# Patient Record
Sex: Male | Born: 1935
Health system: Southern US, Community
[De-identification: ages and names within clinical notes are randomized; demographics above are authoritative.]

## PROBLEM LIST (undated history)

## (undated) DIAGNOSIS — F0390 Unspecified dementia without behavioral disturbance: Secondary | ICD-10-CM

## (undated) DIAGNOSIS — I00 Rheumatic fever without heart involvement: Secondary | ICD-10-CM

## (undated) DIAGNOSIS — I10 Essential (primary) hypertension: Secondary | ICD-10-CM

## (undated) DIAGNOSIS — I639 Cerebral infarction, unspecified: Secondary | ICD-10-CM

## (undated) DIAGNOSIS — I6340 Cerebral infarction due to embolism of unspecified cerebral artery: Secondary | ICD-10-CM

## (undated) DIAGNOSIS — G9340 Encephalopathy, unspecified: Secondary | ICD-10-CM

## (undated) DIAGNOSIS — I2510 Atherosclerotic heart disease of native coronary artery without angina pectoris: Secondary | ICD-10-CM

## (undated) DIAGNOSIS — E119 Type 2 diabetes mellitus without complications: Secondary | ICD-10-CM

## (undated) DIAGNOSIS — E785 Hyperlipidemia, unspecified: Secondary | ICD-10-CM

## (undated) HISTORY — DX: Rheumatic fever without heart involvement: I00

## (undated) HISTORY — DX: Type 2 diabetes mellitus without complications: E11.9

## (undated) HISTORY — PX: CORONARY ANGIOPLASTY WITH STENT PLACEMENT: SHX49

## (undated) HISTORY — PX: APPENDECTOMY: SHX54

## (undated) HISTORY — DX: Hyperlipidemia, unspecified: E78.5

## (undated) HISTORY — DX: Essential (primary) hypertension: I10

## (undated) HISTORY — DX: Atherosclerotic heart disease of native coronary artery without angina pectoris: I25.10

---

## 2003-03-23 ENCOUNTER — Ambulatory Visit (HOSPITAL_COMMUNITY): Admission: RE | Admit: 2003-03-23 | Discharge: 2003-03-23 | Payer: Self-pay | Admitting: Pulmonary Disease

## 2003-03-27 ENCOUNTER — Ambulatory Visit (HOSPITAL_COMMUNITY): Admission: RE | Admit: 2003-03-27 | Discharge: 2003-03-27 | Payer: Self-pay | Admitting: Pulmonary Disease

## 2005-03-09 ENCOUNTER — Encounter: Payer: Self-pay | Admitting: Internal Medicine

## 2005-03-10 ENCOUNTER — Inpatient Hospital Stay (HOSPITAL_COMMUNITY): Admission: AD | Admit: 2005-03-10 | Discharge: 2005-03-12 | Payer: Self-pay | Admitting: Pediatrics

## 2005-03-10 ENCOUNTER — Ambulatory Visit: Payer: Self-pay | Admitting: *Deleted

## 2005-03-11 ENCOUNTER — Ambulatory Visit: Payer: Self-pay | Admitting: Cardiovascular Disease

## 2005-03-20 ENCOUNTER — Ambulatory Visit: Payer: Self-pay | Admitting: *Deleted

## 2005-03-24 ENCOUNTER — Ambulatory Visit (HOSPITAL_COMMUNITY): Admission: RE | Admit: 2005-03-24 | Discharge: 2005-03-24 | Payer: Self-pay | Admitting: *Deleted

## 2005-05-27 ENCOUNTER — Ambulatory Visit: Payer: Self-pay | Admitting: *Deleted

## 2005-06-11 ENCOUNTER — Ambulatory Visit: Payer: Self-pay | Admitting: Cardiology

## 2005-10-27 ENCOUNTER — Ambulatory Visit: Payer: Self-pay | Admitting: *Deleted

## 2005-11-23 ENCOUNTER — Ambulatory Visit: Payer: Self-pay | Admitting: *Deleted

## 2009-06-29 ENCOUNTER — Ambulatory Visit: Payer: Self-pay | Admitting: Cardiovascular Disease

## 2009-06-29 ENCOUNTER — Observation Stay (HOSPITAL_COMMUNITY): Admission: EM | Admit: 2009-06-29 | Discharge: 2009-07-01 | Payer: Self-pay | Admitting: Emergency Medicine

## 2009-06-29 ENCOUNTER — Encounter (INDEPENDENT_AMBULATORY_CARE_PROVIDER_SITE_OTHER): Payer: Self-pay | Admitting: *Deleted

## 2009-06-29 LAB — CONVERTED CEMR LAB
BUN: 5 mg/dL
Chloride: 103 meq/L
GFR calc non Af Amer: >60 mL/min
Potassium: 3.9 meq/L
Sodium: 136 meq/L

## 2009-06-30 ENCOUNTER — Encounter (INDEPENDENT_AMBULATORY_CARE_PROVIDER_SITE_OTHER): Payer: Self-pay | Admitting: *Deleted

## 2009-06-30 LAB — CONVERTED CEMR LAB
Alkaline Phosphatase: 40 units/L
BUN: 7 mg/dL
CO2: 26 meq/L
Chloride: 100 meq/L
Glomerular Filtration Rate, Af Am: >60 mL/min/{1.73_m2}
Potassium: 4 meq/L
Total Protein: 6.2 g/dL

## 2009-07-12 ENCOUNTER — Encounter (INDEPENDENT_AMBULATORY_CARE_PROVIDER_SITE_OTHER): Payer: Self-pay | Admitting: *Deleted

## 2009-07-12 DIAGNOSIS — I10 Essential (primary) hypertension: Secondary | ICD-10-CM | POA: Insufficient documentation

## 2009-07-12 DIAGNOSIS — I209 Angina pectoris, unspecified: Secondary | ICD-10-CM

## 2009-07-12 DIAGNOSIS — E785 Hyperlipidemia, unspecified: Secondary | ICD-10-CM

## 2009-07-12 DIAGNOSIS — E119 Type 2 diabetes mellitus without complications: Secondary | ICD-10-CM

## 2009-07-22 ENCOUNTER — Encounter: Payer: Self-pay | Admitting: Adult Health

## 2009-07-22 ENCOUNTER — Ambulatory Visit: Payer: Self-pay | Admitting: Cardiology

## 2009-07-22 DIAGNOSIS — I2510 Atherosclerotic heart disease of native coronary artery without angina pectoris: Secondary | ICD-10-CM

## 2009-07-23 ENCOUNTER — Encounter (INDEPENDENT_AMBULATORY_CARE_PROVIDER_SITE_OTHER): Payer: Self-pay

## 2009-08-05 ENCOUNTER — Ambulatory Visit (HOSPITAL_COMMUNITY): Admission: RE | Admit: 2009-08-05 | Discharge: 2009-08-05 | Payer: Self-pay | Admitting: Internal Medicine

## 2010-05-12 ENCOUNTER — Ambulatory Visit: Payer: Self-pay | Admitting: Cardiology

## 2010-05-12 DIAGNOSIS — K089 Disorder of teeth and supporting structures, unspecified: Secondary | ICD-10-CM | POA: Insufficient documentation

## 2010-05-13 ENCOUNTER — Encounter: Payer: Self-pay | Admitting: Adult Health

## 2010-05-28 ENCOUNTER — Telehealth (INDEPENDENT_AMBULATORY_CARE_PROVIDER_SITE_OTHER): Payer: Self-pay | Admitting: *Deleted

## 2010-07-15 NOTE — Miscellaneous (Signed)
Summary: Medications update  Clinical Lists Changes  Medications: Changed medication from ISOSORBIDE MONONITRATE CR 30 MG XR24H-TAB (ISOSORBIDE MONONITRATE) take 1/2 tab daily to ISOSORBIDE MONONITRATE CR 30 MG XR24H-TAB (ISOSORBIDE MONONITRATE) take 1 tab daily

## 2010-07-15 NOTE — Assessment & Plan Note (Signed)
Summary: post hosp [facility] per pt phone call/tg   Visit Type:  Follow-up Primary Provider:  Osborne Casco   History of Present Illness: Jacob Little is a 8 CM who was recenty discharged from [facility] after undergoing cardiac catherization in the setting of known CAD with DES to the Circumflex artery,and recurrent chest pain.  He also has a history of DM, hyperlipidemia, and hypertension.   The catherization revealed single vessel coronary disease with patent stent in the proximal circumflex. Nonobstructive disease elsewhere.  Normal LV fx.  EF 65%.  He was to be treated medically and was started on Imdur 30mg  daily.  He has continued discomfort in his chest which he describes as shooting, fleeting pain, across the left side.  Lasting seconds.  He sometimes has complaints of transient positional dizziness. He admits to excessive caffine daily-" can drink a pot a day or more."  His daughter, who is a Teacher, early years/pre, accomplainies him and is requesting a change from lopressor 25mg  two times a day to Toprol XL.  In the past he did not tolerate this secondary to mild syncope.  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 Take 1 Tab Daily 2)  Aspir-Low 81 Mg Tbec (Aspirin) .... Take 1 Tab Daily 3)  Metformin Hcl 1000 Mg Tabs (Metformin Hcl) .... Take 1 Tab Two Times A Day 4)  Simvastatin 20 Mg Tabs (Simvastatin) .... Take 1 Tab Daily  Current Medications (verified): 1)  Plavix 75 Mg Tabs (Clopidogrel Bisulfate) .... Take 1 Tablet By Mouth Once A Day 2)  Aspir-Trin 325 Mg Tbec (Aspirin) .... Take 1 Tab Daily 3)  Metformin  Hcl 1000 Mg Tabs (Metformin Hcl) .... Take 1 Tab Two Times A Day 4)  Simvastatin 20 Mg Tabs (Simvastatin) .... Take 1 Tablet By Mouth Once A Day 5)  Metoprolol Tartrate 50 Mg Tabs (Metoprolol Tartrate) .... Take 1/2 Tab Two Times A Day 6)  Isosorbide Mononitrate Cr 30 Mg Xr24h-Tab (Isosorbide Mononitrate) .... Take 1/2 Tab Daily 7)  Nitrostat 0.4 Mg Subl (Nitroglycerin) .... Take As Needed  Allergies (verified): No Known Drug Allergies  Past History:  Past Medical History: Last updated: 07/12/2009 Current Problems:  HYPERLIPIDEMIA (ICD-272.4) HYPERTENSION (ICD-401.9) DM (ICD-250.00) ANGINA, ATYPICAL (ICD-413.9)  Risk Factors: Alcohol Use: 0 (07/22/2009) Caffeine Use: 5+ (07/22/2009) Exercise: no (07/12/2009) PMH-FH-SH reviewed-no changes except otherwise noted  Social History: Alcohol drinks/day:  0 Smoking Status:  quit < 6 months Caffeine use/day:  5+  Review of Systems       Occasional dizziness with position changes. Fleeting sharp chest discomfort.  All other systems have been reviewed and are negative unless stated above.   Vital Signs:  Patient profile:   75 year old male Height:      69 inches Weight:      175 pounds BMI:     25.94 Pulse rate:   54 / minute BP sitting:   136 / 73  (right arm)  Vitals Entered By: Dreama Saa, CNA (July 22, 2009 11:07 AM)  Physical Exam  General:  Well developed, well nourished, in no  acute distress. Lungs:  Clear bilaterally to auscultation and percussion. Heart:  Non-displaced PMI, chest non-tender; regular rate and rhythm, S1, S2 without murmurs, rubs or gallops. Carotid upstroke normal, no bruit. Normal abdominal aortic size, no bruits. Femorals normal pulses, no bruits. Pedals normal pulses. No edema, no varicosities. Abdomen:  Obese 2+ bowel sounds Msk:  Back normal, normal gait. Muscle strength and tone normal. No femoral brulsing. Neurologic:  Very HOH Skin:  Intact without lesions or rashes. Psych:  Normal  affect.   EKG  Procedure date:  07/22/2009  Findings:      Nonspecific intraventricular block Sinus bradycardia with rate of:  52 bpm  Impression & Recommendations:  Problem # 1:  ANGINA, ATYPICAL (ICD-413.9) Assessment Unchanged  His updated medication list for this problem includes:    Plavix 75 Mg Tabs (Clopidogrel bisulfate) .Marland Kitchen... Take 1 tablet by mouth once a day    Aspir-trin 325 Mg Tbec (Aspirin) .Marland Kitchen... Take 1 tab daily    Metoprolol Tartrate 50 Mg Tabs (Metoprolol tartrate) .Marland Kitchen... Take 1/2 tab two times a day    Isosorbide Mononitrate Cr 30 Mg Xr24h-tab (Isosorbide mononitrate) .Marland Kitchen... Take 1/2 tab daily    Nitrostat 0.4 Mg Subl (Nitroglycerin) .Marland Kitchen... Take as needed  Problem # 2:  CORONARY ATHEROSCLEROSIS NATIVE CORONARY ARTERY (ICD-414.01) Assessment: Unchanged Review of cath report reveals non obstructive disease with patent stent to Cx Will continue Imdur as long has he tolerates. Some concern for postitional dizziness and bradycardia.  I will not change his Toprol to XL as long as he remains bradycardic.  Easier to titrate down if we need to.  Advised against excessive caffine and to watch this especially in the summer months to avoid dehydration.  His updated medication list for this problem includes:    Plavix 75 Mg Tabs (Clopidogrel bisulfate) .Marland Kitchen... Take 1 tablet by mouth once a day    Aspir-trin 325 Mg Tbec (Aspirin) .Marland Kitchen... Take 1 tab daily    Metoprolol Tartrate 50 Mg Tabs (Metoprolol tartrate) .Marland Kitchen... Take 1/2 tab two times a day    Isosorbide Mononitrate Cr 30 Mg Xr24h-tab (Isosorbide mononitrate) .Marland Kitchen... Take 1/2 tab daily    Nitrostat 0.4 Mg Subl (Nitroglycerin) .Marland Kitchen... Take as needed  Patient Instructions: 1)  Your physician recommends that you schedule a follow-up appointment in: 6 months Prescriptions: NITROSTAT 0.4 MG SUBL (NITROGLYCERIN) take as needed  #25 x 3   Entered by:   Teressa Lower RN   Authorized by:   Joni Reining, NP   Signed by:   Teressa Lower RN  on 07/22/2009   Method used:   Print then Give to Patient   RxID:   [id number] ISOSORBIDE MONONITRATE CR 30 MG XR24H-TAB (ISOSORBIDE MONONITRATE) take 1/2 tab daily  #15 x 6   Entered by:   Teressa Lower RN   Authorized by:   Joni Reining, NP   Signed by:   Teressa Lower RN on 07/22/2009   Method used:   Print then Give to Patient   RxID:   [id number] METOPROLOL TARTRATE 50 MG TABS (METOPROLOL TARTRATE) take 1/2 tab two times a day  #30 x 6   Entered by:   Teressa Lower RN   Authorized by:   Joni Reining, NP   Signed by:   Teressa Lower RN on 07/22/2009   Method used:   Print then Give to Patient   RxID:   [id number] SIMVASTATIN 20 MG TABS (SIMVASTATIN) Take 1 tablet by mouth once a day  #30 x 6  Entered by:   Teressa Lower RN   Authorized by:   Joni Reining, NP   Signed by:   Teressa Lower RN on 07/22/2009   Method used:   Print then Give to Patient   RxID:   [id number] METFORMIN HCL 1000 MG TABS (METFORMIN HCL) take 1 tab two times a day  #60 x 6   Entered by:   Teressa Lower RN   Authorized by:   Joni Reining, NP   Signed by:   Teressa Lower RN on 07/22/2009   Method used:   Print then Give to Patient   RxID:   [id number] PLAVIX 75 MG TABS (CLOPIDOGREL BISULFATE) Take 1 tablet by mouth once a day  #30 x 6   Entered by:   Teressa Lower RN   Authorized by:   Joni Reining, NP   Signed by:   Teressa Lower RN on 07/22/2009   Method used:   Print then Give to Patient   RxID:   [id number]

## 2010-07-15 NOTE — Assessment & Plan Note (Signed)
Summary: past due for 6 mth f/u/tg   Visit Type:  Follow-up Primary Provider:  Osborne Casco  CC:  occasional sob.  History of Present Illness: [name] is a 75 y/o CM we are following for continued treatment and assessment of CAD with  DES stent to Circumflex in 2011, with history of hypertension, diabetes and hyperlipidemia.  He is without complaints of chest pain, DOE or any cardiac symptoms.  He is tolerating his medications without complaint as well.  Preventive Screening-Counseling & Management  Alcohol-Tobacco     Alcohol drinks/day: 0     Smoking Status: quit  Current Medications (verified): 1)  Plavix 75 Mg Tabs (Clopidogrel Bisulfate) .... Take 1 Tablet By Mouth Once A Day 2)  Aspir-Trin 325 Mg Tbec (Aspirin) .... Take 1 Tab Daily 3)  Metformin Hcl 1000 Mg Tabs (Metformin Hcl) .... Take 1 Tab Two Times A Day 4)  Simvastatin 20 Mg Tabs (Simvastatin) .... Take 1 Tablet By Mouth Once A Day 5)  Metoprolol Tartrate 50 Mg Tabs (Metoprolol Tartrate) .... Take 1/2 Tab Two Times A Day 6)  Isosorbide Mononitrate Cr 30 Mg Xr24h-Tab (Isosorbide Mononitrate) .... Take 1 Tab Daily 7)  Nitrostat 0.4 Mg Subl (Nitroglycerin) .... Take As Needed  Allergies: No Known Drug Allergies  Comments:  Nurse/Medical Assistant: patient brought meds and patient and i reviewed meds the patient stated all meds are correct  Past History:  Past medical, surgical, family and social histories (including risk factors) reviewed, and no changes noted (except as noted below).  Past Medical History: Reviewed history from 07/12/2009 and no changes required. Current Problems:  HYPERLIPIDEMIA (ICD-272.4) HYPERTENSION (ICD-401.9) DM (ICD-250.00) ANGINA, ATYPICAL (ICD-413.9)  Past Surgical History: Reviewed history from 07/12/2009 and no changes required. cath 07/01/2009  Family History: Reviewed history and no changes required.  Social History: Reviewed history from 07/12/2009 and no changes  required. Tobacco Use - No.  Alcohol Use - no Regular Exercise - no Drug Use - no Smoking Status:  quit  Review of Systems       Tooth pain right lower jaw after filling came out.  All other systems have been reviewed and are negative unless stated above.   Vital Signs:  Patient profile:   75 year old male Weight:      168 pounds BMI:     24.90 O2 Sat:      98 % on Room air Pulse rate:   83 / minute BP sitting:   105 / 64  (right arm)  Vitals Entered By: Dreama Saa, CNA (May 12, 2010 1:11 PM)  O2 Flow:  Room air  Physical Exam  General:  normal appearance.   Head:  normocephalic and atraumatic Mouth:  poor dentition.   Lungs:  Clear bilaterally to auscultation and percussion. Heart:  Non-displaced PMI, chest non-tender; regular rate and rhythm, S1, S2 without murmurs, rubs or gallops. Carotid upstroke normal, no bruit. Normal abdominal aortic size, no bruits. Femorals normal pulses, no bruits. Pedals normal pulses. No edema, no varicosities. Abdomen:  Bowel sounds positive; abdomen soft and non-tender without masses, organomegaly, or hernias noted. No hepatosplenomegaly. Msk:  Back normal, normal gait. Muscle strength and tone normal. Pulses:  pulses normal in all 4 extremities Extremities:  No clubbing or cyanosis. Neurologic:  Alert and oriented x 3. Psych:  depressed affect.  Still grieving over death of wife. Tearful when he speaks of her.   EKG  Procedure date:  05/12/2010  Findings:      Normal  sinus rhythm with rate of:65 bpm  PVC's noted.  Right bundle branch block.    Impression & Recommendations:  Problem # 1:  CORONARY ATHEROSCLEROSIS NATIVE CORONARY ARTERY (ICD-414.01) He is without cardiac complaint at this time.  He is not compliant with low cholesterol, low sodium diet because he says he eats out a lot.  He has dropped 8 lbs since being seen last. He says he doesn't eat much since his wife died.  He remains active.  No changes at this time.  He  is up to date on medications. We will see him in one year unless he is symptomatic. His updated medication list for this problem includes:    Plavix 75 Mg Tabs (Clopidogrel bisulfate) .Marland Kitchen... Take 1 tablet by mouth once a day    Aspir-trin 325 Mg Tbec (Aspirin) .Marland Kitchen... Take 1 tab daily    Metoprolol Tartrate 50 Mg Tabs (Metoprolol tartrate) .Marland Kitchen... Take 1/2 tab two times a day    Isosorbide Mononitrate Cr 30 Mg Xr24h-tab (Isosorbide mononitrate) .Marland Kitchen... Take 1 tab daily    Nitrostat 0.4 Mg Subl (Nitroglycerin) .Marland Kitchen... Take as needed  Problem # 2:  HYPERTENSION (ICD-401.9) Recheck in the exam room reveals that his BP is 120's systolic without orthostatic readings.  Continue his medications as directed. His updated medication list for this problem includes:    Aspir-trin 325 Mg Tbec (Aspirin) .Marland Kitchen... Take 1 tab daily    Metoprolol Tartrate 50 Mg Tabs (Metoprolol tartrate) .Marland Kitchen... Take 1/2 tab two times a day  Problem # 3:  DENTAL PAIN (ICD-525.9) He has marked dental caries and some erythema in the right lower jaw, with several missing teeth.  We have referred him to [facility] dentist to be evaluated and treated.  Patient Instructions: 1)  Your physician recommends that you schedule a follow-up appointment in: 1 year

## 2010-07-15 NOTE — Miscellaneous (Signed)
Summary: HOSP LABS  Clinical Lists Changes  Observations: Added new observation of CALCIUM: 8.7 mg/dL (16/03/9603 54:09) Added new observation of ALBUMIN: 3.7 g/dL (81/19/1478 29:56) Added new observation of PROTEIN, TOT: 6.2 g/dL (21/30/8657 84:69) Added new observation of SGPT (ALT): 17 units/L (06/30/2009 16:05) Added new observation of SGOT (AST): 18 units/L (06/30/2009 16:05) Added new observation of ALK PHOS: 40 units/L (06/30/2009 16:05) Added new observation of GFR AA: >60 mL/min/1.58m2 (06/30/2009 16:05) Added new observation of GFR: >60 mL/min (06/30/2009 16:05) Added new observation of CREATININE: 0.76 mg/dL (62/95/2841 32:44) Added new observation of BUN: 7 mg/dL (06/17/7251 66:44) Added new observation of BG RANDOM: 145 mg/dL (03/47/4259 56:38) Added new observation of CO2 PLSM/SER: 26 meq/L (06/30/2009 16:05) Added new observation of CL SERUM: 100 meq/L (06/30/2009 16:05) Added new observation of K SERUM: 4.0 meq/L (06/30/2009 16:05) Added new observation of NA: 132 meq/L (06/30/2009 16:05) Added new observation of CALCIUM: 9.4 mg/dL (75/64/3329 51:88) Added new observation of GFR AA: >60 mL/min/1.41m2 (06/29/2009 16:05) Added new observation of GFR: >60 mL/min (06/29/2009 16:05) Added new observation of CREATININE: 0.82 mg/dL (41/66/0630 16:01) Added new observation of BUN: 5 mg/dL (09/32/3557 32:20) Added new observation of BG RANDOM: 166 mg/dL (25/42/7062 37:62) Added new observation of CO2 PLSM/SER: 26 meq/L (06/29/2009 16:05) Added new observation of CL SERUM: 103 meq/L (06/29/2009 16:05) Added new observation of K SERUM: 3.9 meq/L (06/29/2009 16:05) Added new observation of NA: 136 meq/L (06/29/2009 16:05)

## 2010-07-17 NOTE — Progress Notes (Signed)
Summary: rx was called in worng  Phone Note Call from Patient Call back at [phone]   Caller: pt daughter Maxine Glenn Reason for Call: Talk to Nurse Summary of Call: when pt came for last appt she sent a letter to call metoprolol in for 20mg  so he would not have to cut the pills in half. The patient can not handle cutting his pills in half. they have already filled the rx that was called in for him but would like another one called in for 20mg  is possible. Initial call taken by: Faythe Ghee,  May 28, 2010 4:11 PM    New/Updated Medications: METOPROLOL TARTRATE 25 MG TABS (METOPROLOL TARTRATE) Take one tablet by mouth twice a day Prescriptions: METOPROLOL TARTRATE 25 MG TABS (METOPROLOL TARTRATE) Take one tablet by mouth twice a day  #60 x 3   Entered by:   Teressa Lower RN   Authorized by:   Joni Reining, NP   Signed by:   Teressa Lower RN on 05/28/2010   Method used:   Electronically to        [facility] [phone]* (retail)       [address]       [place], [place]  [address]       Ph: [phone] or [phone]       Fax: [phone]   RxID:   [id number]

## 2010-08-31 LAB — CBC
HCT: 36 % — ABNORMAL LOW (ref 39.0–52.0)
Hemoglobin: 12.5 g/dL — ABNORMAL LOW (ref 13.0–17.0)
MCHC: 34.2 g/dL (ref 30.0–36.0)
MCV: 92.2 fL (ref 78.0–100.0)
Platelets: 227 10*3/uL (ref 150–400)
Platelets: 202 10*3/uL (ref 150–400)
RDW: 13.3 % (ref 11.5–15.5)
RDW: 13.2 % (ref 11.5–15.5)

## 2010-08-31 LAB — PROTIME-INR
INR: 0.97 (ref 0.00–1.49)
Prothrombin Time: 12.8 seconds (ref 11.6–15.2)

## 2010-08-31 LAB — CARDIAC PANEL(CRET KIN+CKTOT+MB+TROPI)
Relative Index: INVALID (ref 0.0–2.5)
Relative Index: INVALID (ref 0.0–2.5)
Total CK: 44 U/L (ref 7–232)
Troponin I: 0.01 ng/mL (ref 0.00–0.06)

## 2010-08-31 LAB — COMPREHENSIVE METABOLIC PANEL
AST: 18 U/L (ref 0–37)
BUN: 7 mg/dL (ref 6–23)
Chloride: 100 mEq/L (ref 96–112)
GFR calc non Af Amer: >60 mL/min (ref >60)
Total Bilirubin: 0.5 mg/dL (ref 0.3–1.2)

## 2010-08-31 LAB — DIFFERENTIAL
Basophils Absolute: 0 10*3/uL (ref 0.0–0.1)
Basophils Absolute: 0 10*3/uL (ref 0.0–0.1)
Basophils Relative: 0 % (ref 0–1)
Basophils Relative: 0 % (ref 0–1)
Eosinophils Absolute: 0.3 10*3/uL (ref 0.0–0.7)
Eosinophils Absolute: 0.2 10*3/uL (ref 0.0–0.7)
Lymphs Abs: 1.5 10*3/uL (ref 0.7–4.0)
Neutrophils Relative %: 65 % (ref 43–77)
Neutrophils Relative %: 70 % (ref 43–77)

## 2010-08-31 LAB — BASIC METABOLIC PANEL
BUN: 5 mg/dL — ABNORMAL LOW (ref 6–23)
Chloride: 103 mEq/L (ref 96–112)
Glucose, Bld: 166 mg/dL — ABNORMAL HIGH (ref 70–99)
Potassium: 3.9 mEq/L (ref 3.5–5.1)

## 2010-08-31 LAB — APTT: aPTT: 25 seconds (ref 24–37)

## 2010-08-31 LAB — GLUCOSE, CAPILLARY
Glucose-Capillary: 122 mg/dL — ABNORMAL HIGH (ref 70–99)
Glucose-Capillary: 132 mg/dL — ABNORMAL HIGH (ref 70–99)
Glucose-Capillary: 146 mg/dL — ABNORMAL HIGH (ref 70–99)
Glucose-Capillary: 171 mg/dL — ABNORMAL HIGH (ref 70–99)
Glucose-Capillary: 178 mg/dL — ABNORMAL HIGH (ref 70–99)

## 2010-08-31 LAB — TROPONIN I: Troponin I: 0.02 ng/mL (ref 0.00–0.06)

## 2010-08-31 LAB — CK TOTAL AND CKMB (NOT AT ARMC): CK, MB: 1.8 ng/mL (ref 0.3–4.0)

## 2010-08-31 LAB — POCT CARDIAC MARKERS: Troponin i, poc: <0.05 ng/mL (ref 0.00–0.09)

## 2010-08-31 LAB — MAGNESIUM: Magnesium: 1.7 mg/dL (ref 1.5–2.5)

## 2010-10-15 ENCOUNTER — Other Ambulatory Visit: Payer: Self-pay

## 2010-10-15 MED ORDER — METOPROLOL TARTRATE 25 MG PO TABS: 25.0000 mg | ORAL_TABLET | Freq: Two times a day (BID) | ORAL | Status: DC

## 2010-10-31 NOTE — Discharge Summary (Signed)
NAME:  Jacob Little, MACKE NO.:  [id number]   MEDICAL RECORD NO.:  [id number]          PATIENT TYPE:  OBV   LOCATION:  A218                          FACILITY:  [facility]   PHYSICIAN:  Charlton Haws, M.D.     DATE OF BIRTH:  08/25/1935   DATE OF ADMISSION:  03/10/2005  DATE OF DISCHARGE:  03/12/2005                                 DISCHARGE SUMMARY   PRIMARY CARDIOLOGIST:  Vida Roller, M.D.   PRIMARY CARE PHYSICIAN:  Kingsley Callander. Ouida Sills, M.D.   PRINCIPAL DIAGNOSIS:  Unstable angina.   OTHER DIAGNOSES:  1.  Chronic obstructive pulmonary disease.  2.  Tobacco abuse.  3.  History of rheumatic fever.  4.  Alcohol abuse.   ALLERGIES:  NO KNOWN DRUG ALLERGIES.   PROCEDURE:  Left heart cardiac catheterization with successful PCI and  stenting of the proximal left circumflex.   HISTORY OF PRESENT ILLNESS:  The patient is a 75 year old white male who had  no prior documented history of coronary artery disease who was in his usual  state of health until March 05, 2005 when he began to experience  exertional substernal chest discomfort associated with shortness of breath  and relieved with rest.  His symptoms progressed, and he had his worse  episode on March 09, 2005, thus prompting him to present to [facility] for further evaluation.  He had normal CK's and MB's with mild  elevation of troponin at a peak of 0.09 on the second set.  He was  subsequently transferred to [facility] for further evaluation and  cardiac catheterization.   HOSPITAL COURSE:  The patient underwent cardiac catheterization on March 11, 2005 revealing a 30% lesion in the proximal and mid-LAD, an 80% lesion  in the proximal circumflex which was hazy in nature.  The RC had a 50%  stenosis in the midsection of the artery.  He had an LV EF of 50% with  intrabasal hypokinesis.  Films were reviewed by Dr. Eden Emms and Dr. Riley Kill,  and decision was made to pursue the proximal left  circumflex percutaneously.  This was successfully sewn in with a 2.5 x 24-mm Taxis drug-eluting stent,  and the patient was placed on Plavix therapy.  He tolerated this procedure  well, and this morning has been ambulating without recurrent symptoms or  limitations.  He is being discharged home today in satisfactory condition.  It has been recommended that the patient have outpatient Myoview in six to  eight weeks to evaluate the ischemic significance of the lesion in the RCA.  He has also been noted on exam to have an asymptomatic right carotid bruit,  and outpatient carotid Dopplers are recommended.  The patient is being  discharged home today in satisfactory condition.   DISCHARGE LABORATORY DATA:  Hemoglobin 14.6, hematocrit 42.6, WBC 11.8,  platelets 280, MCV 95.7.  Sodium 138, potassium 4.1, chloride 107, CO2 26,  BUN 12, creatinine 0.9, glucose 147.  Total cholesterol 169, triglycerides  121, HDL 44, LDL 101, CK 55, CK-MB 1.7.  Peak troponin during this admission  was 0.07.  Total bilirubin 0.7, alkaline phosphatase 52, AST 18, ALT 20,  albumin 3.3, calcium 9.0.   DISPOSITION:  The patient is being discharged home today in good condition.   FOLLOW UP:  The patient is asked to follow up with his primary care  physician, Dr. Ouida Sills, in one to two weeks.  He has an appointment with Dr.  Vida Roller on March 24, 2005 at 2:30 p.m.   DISCHARGE MEDICATIONS:  1.  Aspirin 325 mg daily.  2.  Plavix 75 mg daily.  3.  Zocor 40 mg q.h.s.  4.  Nitroglycerin 0.4 mg sublingual p.r.n. chest pain.   OUTSTANDING LABS AND STUDIES:  None.   DURATION OF DISCHARGE ENCOUNTER:  45 minutes, including physician time.      Ok Anis, NP    ______________________________  Charlton Haws, M.D.    CRB/MEDQ  D:  03/12/2005  T:  03/12/2005  Job:  [id number]   cc:   Vida Roller, M.D.  Fax: [phone]   Kingsley Callander. Ouida Sills, MD  Fax: [phone]

## 2010-10-31 NOTE — Cardiovascular Report (Signed)
NAME:  Jacob Little, Jacob Little               ACCOUNT NO.:  [id number]   MEDICAL RECORD NO.:  [id number]          PATIENT TYPE:  INP   LOCATION:  6529                         FACILITY:  [facility]   PHYSICIAN:  Arturo Morton. Riley Kill, M.D. [facility] [place] OF BIRTH:  [birth year]   DATE OF PROCEDURE:  03/11/2005  DATE OF DISCHARGE:                              CARDIAC CATHETERIZATION   INDICATIONS:  Jacob Little is a 75 year old gentleman from [place] who  presents with a non-ST-elevation MI. There was borderline elevation of  troponins. He had a fairly typical story. Catheterization was done by Dr.  Eden Emms which demonstrated mild disease in the mid left anterior descending  artery, moderate lesion of the right coronary artery and a high-grade  stenosis in the proximal circumflex. With this, it was felt that  percutaneous intervention would be the optimal treatment. I discussed the  options with the patient's daughters and also with the patient.  Subsequently, we recommended percutaneous coronary intervention and he was  already in the laboratory at this time and agreed to proceed.   PROCEDURE:  Percutaneous stenting of proximal circumflex coronary artery.   DESCRIPTION OF PROCEDURE:  The patient was already in the catheterization  laboratory and prepped and draped. A 6-French indwelling femoral sheath was  already in place. Heparin and Integrilin were given according to protocol.  The lesion was crossed with a Luge wire and a JL-4 guiding catheter. ACT was  checked and was appropriate. Using a 10 mm 2.0 cutting balloon, we opened  the proximal lesion which was modestly calcified and therefore accounting  for the use of the cutting balloon. Following this, a 24 mm x 2.5 Taxus drug-  eluting stent was placed across the lesion. This was initially taken up to 7  atmospheres to try to lead the distal portion of the stent optimally sized  for the distal vessel. The proximal vessel was clearly larger. Following  this,  a 2.75 Quantum Maverick was taken to the distal edge of the stent. A 6-  7 atmospheres inflation was done and then the balloon was pulled back 1 mm.  At this point, a 14 atmospheres inflation was done and then pulled back to  the proximal edge of the stent, and post dilatation done in the circumflex  at this location as well. There was an excellent angiographic appearance at  the completion of the procedure and no complications. We took one final view  of the right coronary artery after the administration of intracoronary  nitroglycerin to better identify the severity of stenosis in the RCA. All  catheters were removed. Femoral sheath was sewn into place and he was taken  to the holding area in satisfactory clinical condition.   ANGIOGRAPHIC DATA:  The circumflex proximally just after a tiny  insignificant marginal branch demonstrates a 90% area of stenosis followed  by a long 70% segment. The vessel then opens up prior to its bifurcation  into two marginal branches. Following ballooning and stenting, this is  reduced to 0% residual luminal narrowing with excellent edge appearance and  no evidence of edge tear. Distally, the large marginal  branch has mild  luminal irregularity proximally and the AV portion which goes into the  second marginal branch also has probably 40-50% area of narrowing both  proximally and more distally. All of these appear relatively smooth. There  is TIMI III flow into the distal vessel.   The right coronary artery demonstrates what appears to be about a 60% area  of narrowing in the mid-right vessel after the administration of  intracoronary nitroglycerin. We also did a left lateral view to better  identify this area. It did not appear to be critical.   CONCLUSION:  Successful percutaneous stenting of the circumflex coronary  artery with a Taxus drug-eluting stent.   DISPOSITION:  The patient has multiple risk factors. He has scattered  lesions throughout the  coronary anatomy including the LAD, distal  circumflex, and mid-right. Aggressive risk factor reduction is warranted and  required if he is to have a good outcome. I have explained this to the  daughters detail.      Arturo Morton. Riley Kill, M.D. [facility]  Electronically Signed     TDS/MEDQ  D:  03/11/2005  T:  03/11/2005  Job:  [id number]   cc:   Vida Roller, M.D.  Fax: [phone]   CV Laboratory   Kingsley Callander. Ouida Sills, MD  Fax: [phone]   Patient's medical record

## 2010-10-31 NOTE — Consult Note (Signed)
NAME:  LYRIQ, JARCHOW NO.:  [id number]   MEDICAL RECORD NO.:  [id number]          PATIENT TYPE:  OBV   LOCATION:  A218                          FACILITY:  [facility]   PHYSICIAN:  Vida Roller, M.D.   DATE OF BIRTH:  1935/12/01   DATE OF CONSULTATION:  03/10/2005  DATE OF DISCHARGE:                                   CONSULTATION   HISTORY OF PRESENT ILLNESS:  [name] is a 75 year old man who has had  minimal to no medical follow up throughout most of his life who presents  with chest discomfort that started Thursday morning.  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  It radiated down both  arms.  Was not associated with any shortness of breath.  However, it lasted  about two hours.  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  This lasted about 5 minutes.  He stopped activity.  The pain resolved.  He  started the activity again and the pain began again.  He sought medical  attention after this happened over the course of several days.  He was  admitted to the hospital.  We were asked to evaluate him.  He has a history  of chronic obstructive pulmonary disease with active tobacco abuse.  He also  has a history of rheumatic heart disease, although, this is poorly  documented.  He appears to have had rheumatic fever as a child.  Prior to  admission he took just 325 mg of aspirin every other day.  Here in the  hospital he is on aspirin 325 mg once daily, thiamine 100 mg daily,  multivitamin once daily, Lovenox 80 mg subcu b.i.d. and nicotine 21 mg q.24  h.   SOCIAL HISTORY:  Lives in [place] by himself.  He recently lost his wife who  died about a year ago.  Was a patient of ours.  He is a Visual merchandiser.   Continues  to farm.  He has two daughters, one of them is a Teacher, early years/pre.  He is an  active smoker and has 100+ pack year smoking history.  Drinks 4-5 beers a  day and has been doing that for years.   FAMILY HISTORY:  Mother died at an unknown age with no coronary disease.  Father died at an unknown age with no coronary disease.  He has one brother  who is alive and one sister who is alive.  He has lost one sister.  None of  whom had coronary artery disease.   REVIEW OF SYSTEMS:  Denies any fevers, chills.  He does have some sweatiness  that occurred with the chest discomfort, that sounds like diaphoresis.  No  recent weight change or adenopathy.  Denies any headache, sinus tenderness,  nasal discharge, bleeding from his nares, voice changes, vertigo,  photophobia, vision or hearing loss and no  problems with his dentition.  He  denies any rashes or lesions.  Does describe chest discomfort, shortness of  breath and dyspnea on exertion.  Denies any PND or orthopnea.  No lower  extremity edema.  No presyncope.  However, occasionally when he gets  intoxicated, he has been known to pass out.  I do not know if this is  necessarily a syncope.  Denies any claudication.  He does have a productive  cough which is chronic.  No wheezing.  Denies any urinary frequency or  urgency.  No dysuria.  No straining at his urine.  No weakness, numbness or  mood disturbances.  No myalgias or arthralgias.  No nausea, vomiting,  diarrhea, bright red blood per rectum, melena, hematochezia, hematemesis.  Denies any odynophagia or dysphagia.  No GERD symptoms.  No abdominal pain.  No changes in his bowel habits.  Denies any polyuria, polydipsia, heat or  cold intolerance.  Remainder of review of systems was negative.   PHYSICAL EXAMINATION:  VITAL SIGNS:  Pulse 67, respirations 20, blood  pressure 134/81, weight 172 pounds.  GENERAL APPEARANCE:  Well-developed, well-nourished white male in no  apparent distress,  alert and oriented x3.  HEENT:  Unremarkable.  NECK:  Supple with no jugular venous distention.  He has a soft right  carotid bruit with normal carotid upstrokes.  LUNGS:  Decreased breath sounds bilaterally at the bases with some course  sounds.  No rales are noted.  CARDIAC:  Regular.  He does not have a murmur.  He does have an S4.  No S3  is noted.  No lifts or thrills and point of maximal impulse is not  displaced.  SKIN:  Without rashes.  ABDOMEN:  Soft, nontender, with normoactive bowel sounds.  No  hepatosplenomegaly is noted.  GU/RECTAL:  Deferred.  EXTREMITIES:  Without cyanosis, clubbing or edema.  Pulses are 1+.  He does  have a right femoral bruit.  NEUROLOGICAL:  Nonfocal.   STUDIES:  Chest x-ray shows COPD but no acute abnormality.  Electrocardiogram shows sinus rhythm at a rate of 71 with a normal interval  and normal P-R interval.  QRS duration is about 140 milliseconds. He has a  right bundle branch block.  There are no acute ST-T wave changes.   LABORATORY DATA:  White blood cell count 9.0, H&H 16 and 47, platelets  315,000.  Sodium 135, potassium 4.3, chloride 102, bicarbonate 28, BUN 7,  creatinine 0.8, blood sugar 142.  LFT's are all within normal limits.  Three  sets of cardiac enzymes show just slightly elevated troponins at 0.07, 0.09,  0.06.  Total protein and albumin are normal.  CK and CK MB's are not  elevated.   IMPRESSION:  This is a gentleman with unstable angina with an abnormal  electrocardiogram and mild abnormal cardiac enzymes with multiple cardiac  risk factors, history of ongoing tobacco abuse and multiple bruits.   PLAN:  Send him to [facility] for heart catheterization.  Will need carotid  Doppler's to continue his current medications, and he will need a smoking  cessation consultation.      Vida Roller, M.D.  Electronically Signed     JH/MEDQ  D:  03/10/2005  T:  03/10/2005  Job:  [id number]

## 2010-10-31 NOTE — Discharge Summary (Signed)
NAME:  Jacob Little, Jacob Little               ACCOUNT NO.:  [id number]   MEDICAL RECORD NO.:  [id number]          PATIENT TYPE:  OBV   LOCATION:  A218                          FACILITY:  [facility]   PHYSICIAN:  Kingsley Callander. Ouida Sills, MD       DATE OF BIRTH:  May 15, 1936   DATE OF ADMISSION:  03/09/2005  DATE OF DISCHARGE:  09/26/2006LH                                 DISCHARGE SUMMARY   DISCHARGE DIAGNOSES:  1.  Unstable angina.  2.  Right bundle branch block.  3.  Hyperglycemia.  4.  History of rheumatic fever.  5.  History of appendectomy.  6.  History of chronic alcohol and tobacco use   HOSPITAL COURSE:  This patient is a 75 year old white male who presented  with intermittent episodes of chest pain suggestive of angina. His EKG  revealed normal sinus rhythm with a right bundle branch block. He was  hospitalized. His first two sets of cardiac enzymes revealed mildly elevated  troponins but normal CPKs. There was no additional change in his EKG. His  case was discussed with Dr. Dorethea Clan who saw him in consultation and  recommended transfer to [facility] for additional cardiology evaluation.   He was treated with a daily aspirin, subcu Lovenox, p.r.n. Ativan, thiamine  and multivitamin. He has been encouraged to avoid tobacco and alcohol in the  future.      Kingsley Callander. Ouida Sills, MD  Electronically Signed     ROF/MEDQ  D:  03/12/2005  T:  03/12/2005  Job:  [id number]

## 2010-10-31 NOTE — Cardiovascular Report (Signed)
NAME:  Jacob Little, Jacob Little NO.:  [id number]   MEDICAL RECORD NO.:  [id number]          PATIENT TYPE:  INP   LOCATION:  6599                         FACILITY:  [facility]   PHYSICIAN:  Charlton Haws, M.D.     DATE OF BIRTH:  18-May-1936   DATE OF PROCEDURE:  03/11/2005  DATE OF DISCHARGE:                              CARDIAC CATHETERIZATION   PROCEDURE:  Coronary arteriography.   INDICATIONS:  Unstable angina pectoris; shortness of breath.   DESCRIPTION OF PROCEDURE:  Cine catheterization was done from the right  femoral artery with 6-French catheters.   FINDINGS:  Left main coronary artery is normal.   Left anterior descending artery had 30% multiple discrete lesions in the  proximal and midportion.  There was some significant kinking of the artery  at this takeoff of the second diagonal branch.  The circumflex coronary  artery had an 80% proximal lesion.  There was some haziness.  It was clearly  the culprit lesion.  Distal vessel was normal.   The right coronary artery was dominant.  There was a 50% discrete lesion in  the mid-to-distal vessel.   RIGHT ANTERIOR OBLIQUE VENTRICULOGRAPHY:  RAO ventriculography showed  inferobasal wall hypokinesis with an EF of 50%.   Aortic pressure was 125/78.  LV pressure was 150/81.   IMPRESSION:  The patient's culprit lesion is the circumflex.  The films will  be reviewed with Dr. Riley Kill.  I suspect he will proceed with angioplasty  with a IIb/IIIa inhibitor.  He can have an outpatient Myoview in 6-8 weeks  to assess his right coronary artery.           ______________________________  Charlton Haws, M.D.     PN/MEDQ  D:  03/11/2005  T:  03/11/2005  Job:  [id number]   cc:   Vida Roller, M.D.  Fax: [phone]   Kingsley Callander. Ouida Sills, MD  Fax: [phone]

## 2010-10-31 NOTE — H&P (Signed)
NAME:  Jacob Little, Jacob Little               ACCOUNT NO.:  [id number]   MEDICAL RECORD NO.:  [id number]          PATIENT TYPE:  OBV   LOCATION:  A218                          FACILITY:  [facility]   PHYSICIAN:  Kingsley Callander. Ouida Sills, MD       DATE OF BIRTH:  30-Nov-1935   DATE OF ADMISSION:  03/09/2005  DATE OF DISCHARGE:  09/26/2006LH                                HISTORY & PHYSICAL   CHIEF COMPLAINT:  Chest pain.   HISTORY OF PRESENT ILLNESS:  This patient is a 75 year old white male who  presented to my office complaining of intermittent chest pain over the past  4 days.  His most recent pain had lasted for 3 hours on the morning of  admission.  He had experienced radiation into both arms.  He felt an aching  in his substernal area.  Over the past weekend, he felt pain in his chest  while trying to dig up potatoes.  He denied any diaphoresis except with the  initial pain he had last Thursday.  He denies nausea, vomiting, or  difficulty breathing.  He is a long-term smoker and currently smokes nearly  2 packs per day.  He has had COPD.  He takes a daily aspirin.  He does not  have a history of hypertension, known hyperlipidemia, or diabetes; however,  he has not been one to seek medical attention or to engage in preventive  health screenings.   When evaluated in the office, his EKG revealed normal sinus rhythm and a  right bundle branch block.   PAST MEDICAL HISTORY:  1.  Appendectomy.  2.  Rheumatic fever.   MEDICATIONS:  Aspirin 1 a day.   ALLERGIES:  None.   SOCIAL HISTORY:  He drinks 6 to 8 beers several days a week.  He smokes  nearly 2 packs per day.  His wife is deceased.   REVIEW OF SYSTEMS:  Noncontributory.   PHYSICAL EXAMINATION:  VITAL SIGNS:  Weight 171. Blood pressure 132/84,  pulse 72, respirations 16.  GENERAL:  Alert and comfortable appearing.  HEENT:  No scleral icterus.  Pharynx is unremarkable.  NECK:  No JVD, thyromegaly, or carotid bruits.  LUNGS: Clear with diminished  breath sounds.  HEART:  Regular with no murmurs or gallops.  ABDOMEN:  Nontender.  No hepatosplenomegaly.  EXTREMITIES:  No cyanosis, clubbing, or edema.  NEUROLOGIC: Grossly intact.  LYMPH NODES: No cervical or supraclavicular adenopathy.   LABORATORY DATA:  EKG reveals normal sinus rhythm and a right bundle branch  block.   Chest x-ray reveals COPD and bronchitic changes.   White count 9, hemoglobin 16.4, platelets 315.  Sodium 135, potassium 4.3,  glucose 142, BUN 7, creatinine 0.8, SGOT 23, albumin 3.9.  CPK 58, troponin  I 0.07.   IMPRESSION:  1.  Possible unstable angina.  He is being hospitalized in a monitored      setting for serial cardiac enzymes.  Treat with aspirin and Lovenox.      Will use sublingual nitroglycerin if needed and will add IV      nitroglycerin if he has  recurrent unrelieved pain. Will obtain a      cardiology consultation.  2.  Chronic alcohol use and tobacco use.  Will start a detoxification      regimen with p.r.n. Ativan plus thiamine and a daily multivitamin.  3.  Hyperglycemia.  Will follow.  4.  History of rheumatic fever.  5.  History of appendectomy.      Kingsley Callander. Ouida Sills, MD  Electronically Signed     ROF/MEDQ  D:  03/10/2005  T:  03/10/2005  Job:  [id number]

## 2010-12-15 ENCOUNTER — Encounter: Payer: Self-pay | Admitting: Adult Health

## 2011-01-20 ENCOUNTER — Other Ambulatory Visit: Payer: Self-pay | Admitting: *Deleted

## 2011-01-20 MED ORDER — SIMVASTATIN 20 MG PO TABS: 20.0000 mg | ORAL_TABLET | Freq: Every evening | ORAL | Status: DC

## 2011-01-20 MED ORDER — ISOSORBIDE MONONITRATE ER 30 MG PO TB24: 30.0000 mg | ORAL_TABLET | Freq: Every day | ORAL | Status: DC

## 2011-05-11 ENCOUNTER — Other Ambulatory Visit: Payer: Self-pay | Admitting: Adult Health

## 2011-05-12 ENCOUNTER — Other Ambulatory Visit: Payer: Self-pay | Admitting: *Deleted

## 2011-05-12 MED ORDER — CLOPIDOGREL BISULFATE 75 MG PO TABS: 75.0000 mg | ORAL_TABLET | Freq: Every day | ORAL | Status: DC

## 2011-05-18 ENCOUNTER — Ambulatory Visit (INDEPENDENT_AMBULATORY_CARE_PROVIDER_SITE_OTHER): Payer: Medicare Other | Admitting: Adult Health

## 2011-05-18 ENCOUNTER — Ambulatory Visit (HOSPITAL_COMMUNITY)
Admission: RE | Admit: 2011-05-18 | Discharge: 2011-05-18 | Disposition: A | Discharge status: Home / Self Care | Payer: Medicare Other | Source: Ambulatory Visit | Attending: Adult Health | Admitting: Adult Health

## 2011-05-18 ENCOUNTER — Ambulatory Visit (HOSPITAL_COMMUNITY): Admission: RE | Admit: 2011-05-18 | Payer: Medicare Other | Source: Ambulatory Visit

## 2011-05-18 ENCOUNTER — Encounter: Payer: Self-pay | Admitting: Adult Health

## 2011-05-18 DIAGNOSIS — I2510 Atherosclerotic heart disease of native coronary artery without angina pectoris: Secondary | ICD-10-CM

## 2011-05-18 DIAGNOSIS — R0600 Dyspnea, unspecified: Secondary | ICD-10-CM

## 2011-05-18 DIAGNOSIS — I10 Essential (primary) hypertension: Secondary | ICD-10-CM

## 2011-05-18 DIAGNOSIS — R0989 Other specified symptoms and signs involving the circulatory and respiratory systems: Secondary | ICD-10-CM

## 2011-05-18 DIAGNOSIS — R0602 Shortness of breath: Secondary | ICD-10-CM | POA: Insufficient documentation

## 2011-05-18 DIAGNOSIS — R918 Other nonspecific abnormal finding of lung field: Secondary | ICD-10-CM | POA: Insufficient documentation

## 2011-05-18 MED ORDER — GUAIFENESIN 100 MG/5ML PO LIQD: 200.0000 mg | Freq: Three times a day (TID) | ORAL | Status: AC | PRN

## 2011-05-18 NOTE — Patient Instructions (Signed)
Your physician recommends that you schedule a follow-up appointment in: 6 months  Your physician has requested that you have an echocardiogram. Echocardiography is a painless test that uses sound waves to create images of your heart. It provides your doctor with information about the size and shape of your heart and how well your heart's chambers and valves are working. This procedure takes approximately one hour. There are no restrictions for this procedure.  Your physician has recommended you make the following change in your medication: Robitussin 2 tsp every 8 hours as needed

## 2011-05-18 NOTE — Progress Notes (Signed)
   HPI:  Jacob Little is a 75 y/o patient of Dr. Dietrich Pates we are following for ongoing assessment and treatment of CAD with DES of the CX in 2011, hypertension, diabetes and hypercholesterolemia.  He continues medically compliant and is without complaints of chest pain, DOE or weakness. He has had a recent cough that bothers him at night when he lies down to sleep. He is up often at night secondary to insomnia, but sleeps a lot during the day. He is basically inactive during the winter months as he usually works outside in his vegetable garden and in his yard during the spring through fall. He continues on Plavix.  No Known Allergies  Current Outpatient Prescriptions  Medication Sig Dispense Refill  . aspirin 325 MG tablet Take 325 mg by mouth daily.        . clopidogrel (PLAVIX) 75 MG tablet Take 1 tablet (75 mg total) by mouth daily.  30 tablet  12  . isosorbide mononitrate (IMDUR) 30 MG 24 hr tablet TAKE 1 TABLET BY MOUTH ONCE DAILY  30 tablet  3  . metFORMIN (GLUCOPHAGE) 1000 MG tablet Take 1,000 mg by mouth 2 (two) times daily with a meal.        . metoprolol tartrate (LOPRESSOR) 25 MG tablet TAKE 1 TABLET BY MOUTH TWICE DAILY  60 tablet  3  . simvastatin (ZOCOR) 20 MG tablet TAKE 1 TABLET BY MOUTH ONCE DAILY  30 tablet  3  . guaiFENesin (ROBITUSSIN) 100 MG/5ML liquid Take 10 mLs (200 mg total) by mouth 3 (three) times daily as needed for cough.  120 mL  0    Past Medical History  Diagnosis Date  . Coronary artery disease   . Diabetes mellitus   . Hyperlipidemia   . Hypertension     Past Surgical History  Procedure Date  . Cardiac catheterization      Single-vessel coronary artery disease with patent stent in the     JYN:WGNFAO of systems complete and found to be negative unless listed above PHYSICAL EXAM BP 126/70  Pulse 63  Ht 5\' 10"  (1.778 m)  Wt 166 lb (75.297 kg)  BMI 23.82 kg/m2  SpO2 97%  General: Well developed, well nourished, in no acute distress Head: Eyes  PERRLA, No xanthomas.   Normal cephalic and atramatic  Lungs: Clear bilaterally to auscultation and percussion. Heart: HRRR S1 S2, with1/6 systolic.  Pulses are 2+ & equal.            No carotid bruit. No JVD.  No abdominal bruits. No femoral bruits. Abdomen: Bowel sounds are positive, abdomen soft and non-tender without masses or                  Hernia's noted. Msk:  Back normal, normal gait. Normal strength and tone for age. Extremities: No clubbing, cyanosis or edema.  DP +1 Neuro: Alert and oriented X 3. Psych:  Good affect, responds appropriately  EKG:NSR rate of 61 bpm. RBBB.  ASSESSMENT AND PLAN

## 2011-05-18 NOTE — Assessment & Plan Note (Signed)
Currently well controlled. No changes in medications.  

## 2011-05-18 NOTE — Assessment & Plan Note (Addendum)
He is stable concerning chest pain and shortness of breath. He has trouble with breathing while lying flat and has been complaining of a cough when he lies down.  I will have chest x-ray completed to evaluate further.  Echocardiogram will be completed as well.

## 2011-05-20 ENCOUNTER — Ambulatory Visit (HOSPITAL_COMMUNITY)
Admission: RE | Admit: 2011-05-20 | Discharge: 2011-05-20 | Disposition: A | Discharge status: Home / Self Care | Payer: Medicare Other | Source: Ambulatory Visit | Attending: Adult Health | Admitting: Adult Health

## 2011-05-20 DIAGNOSIS — R0600 Dyspnea, unspecified: Secondary | ICD-10-CM

## 2011-05-20 DIAGNOSIS — I10 Essential (primary) hypertension: Secondary | ICD-10-CM | POA: Insufficient documentation

## 2011-05-20 DIAGNOSIS — E119 Type 2 diabetes mellitus without complications: Secondary | ICD-10-CM | POA: Insufficient documentation

## 2011-05-20 DIAGNOSIS — R0989 Other specified symptoms and signs involving the circulatory and respiratory systems: Secondary | ICD-10-CM | POA: Insufficient documentation

## 2011-05-20 DIAGNOSIS — I517 Cardiomegaly: Secondary | ICD-10-CM

## 2011-05-20 DIAGNOSIS — I2510 Atherosclerotic heart disease of native coronary artery without angina pectoris: Secondary | ICD-10-CM

## 2011-05-20 DIAGNOSIS — R0609 Other forms of dyspnea: Secondary | ICD-10-CM | POA: Insufficient documentation

## 2011-05-20 DIAGNOSIS — E785 Hyperlipidemia, unspecified: Secondary | ICD-10-CM | POA: Insufficient documentation

## 2011-05-20 NOTE — Progress Notes (Signed)
*  PRELIMINARY RESULTS* Echocardiogram 2D Echocardiogram has been performed.  Jacob Little 05/20/2011, 1:45 PM

## 2011-08-03 DIAGNOSIS — E119 Type 2 diabetes mellitus without complications: Secondary | ICD-10-CM | POA: Diagnosis not present

## 2011-08-11 DIAGNOSIS — I2510 Atherosclerotic heart disease of native coronary artery without angina pectoris: Secondary | ICD-10-CM | POA: Diagnosis not present

## 2011-08-11 DIAGNOSIS — E119 Type 2 diabetes mellitus without complications: Secondary | ICD-10-CM | POA: Diagnosis not present

## 2011-08-17 DIAGNOSIS — J209 Acute bronchitis, unspecified: Secondary | ICD-10-CM | POA: Diagnosis not present

## 2011-09-15 ENCOUNTER — Other Ambulatory Visit: Payer: Self-pay | Admitting: Adult Health

## 2011-12-02 DIAGNOSIS — I2510 Atherosclerotic heart disease of native coronary artery without angina pectoris: Secondary | ICD-10-CM | POA: Diagnosis not present

## 2011-12-02 DIAGNOSIS — Z79899 Other long term (current) drug therapy: Secondary | ICD-10-CM | POA: Diagnosis not present

## 2011-12-02 DIAGNOSIS — E119 Type 2 diabetes mellitus without complications: Secondary | ICD-10-CM | POA: Diagnosis not present

## 2011-12-25 DIAGNOSIS — E119 Type 2 diabetes mellitus without complications: Secondary | ICD-10-CM | POA: Diagnosis not present

## 2011-12-25 DIAGNOSIS — I2510 Atherosclerotic heart disease of native coronary artery without angina pectoris: Secondary | ICD-10-CM | POA: Diagnosis not present

## 2011-12-28 ENCOUNTER — Encounter: Payer: Self-pay | Admitting: Cardiology

## 2012-01-14 ENCOUNTER — Ambulatory Visit (INDEPENDENT_AMBULATORY_CARE_PROVIDER_SITE_OTHER): Payer: Medicare Other | Admitting: Adult Health

## 2012-01-14 ENCOUNTER — Encounter: Payer: Self-pay | Admitting: Adult Health

## 2012-01-14 VITALS — BP 134/70 | HR 60 | Resp 16 | Ht 68.0 in | Wt 165.8 lb

## 2012-01-14 DIAGNOSIS — I2510 Atherosclerotic heart disease of native coronary artery without angina pectoris: Secondary | ICD-10-CM | POA: Diagnosis not present

## 2012-01-14 DIAGNOSIS — I10 Essential (primary) hypertension: Secondary | ICD-10-CM

## 2012-01-14 DIAGNOSIS — E785 Hyperlipidemia, unspecified: Secondary | ICD-10-CM | POA: Diagnosis not present

## 2012-01-14 NOTE — Addendum Note (Signed)
Addended by: Worthy Rancher D on: 01/14/2012 03:21 PM   Modules accepted: Orders

## 2012-01-14 NOTE — Patient Instructions (Addendum)
Your physician recommends that you schedule a follow-up appointment in: 12 months.  

## 2012-01-14 NOTE — Progress Notes (Signed)
   HPI:  Mr. Bellamy is a 76 y/o patient of Dr. Dietrich Pates we are following for ongoing assessment and treatment of CAD with DES of the CX in 2011, hypertension, diabetes and hypercholesterolemia.  He continues medically compliant and is without complaints of chest pain, DOE or weakness.He is up often at night secondary to insomnia, but sleeps a lot during the day. He is basically inactive during the winter months as he usually works outside in his vegetable garden and in his yard during the spring through fall. He continues on Plavix.   He has been asymptomatic since being seen 6 months ago. He remains active. He has occasional heartburn pain after he eats "too many hot dogs" other than that he has been doing well. He is followed by Dr. Ouida Sills for labs 4 times a year.  No Known Allergies  Current Outpatient Prescriptions  Medication Sig Dispense Refill  . aspirin 325 MG tablet Take 325 mg by mouth daily.        . clopidogrel (PLAVIX) 75 MG tablet Take 1 tablet (75 mg total) by mouth daily.  30 tablet  12  . isosorbide mononitrate (IMDUR) 30 MG 24 hr tablet TAKE 1 TABLET BY MOUTH ONCE DAILY  30 tablet  3  . metFORMIN (GLUCOPHAGE) 1000 MG tablet Take 1,000 mg by mouth 2 (two) times daily with a meal.        . metoprolol tartrate (LOPRESSOR) 25 MG tablet TAKE 1 TABLET BY MOUTH TWICE DAILY  60 tablet  3  . simvastatin (ZOCOR) 20 MG tablet TAKE 1 TABLET BY MOUTH ONCE DAILY  30 tablet  3    Past Medical History  Diagnosis Date  . Coronary artery disease   . Diabetes mellitus   . Hyperlipidemia   . Hypertension     Past Surgical History  Procedure Date  . Cardiac catheterization      Single-vessel coronary artery disease with patent stent in the     ZOX:WRUEAV of systems complete and found to be negative unless listed above PHYSICAL EXAM BP 134/70  Pulse 60  Resp 16  Ht 5\' 8"  (1.727 m)  Wt 165 lb 12 oz (75.184 kg)  BMI 25.20 kg/m2  General: Well developed, well nourished, in no acute  distress Head: Eyes PERRLA, No xanthomas.   Normal cephalic and atramatic  Lungs: Clear bilaterally to auscultation and percussion. Heart: HRRR S1 S2, with1/6 systolic.  Pulses are 2+ & equal.            No carotid bruit. No JVD.  No abdominal bruits. No femoral bruits. Abdomen: Bowel sounds are positive, abdomen soft and non-tender without masses or                  Hernia's noted. Msk:  Back normal, normal gait. Normal strength and tone for age. Extremities: No clubbing, cyanosis or edema.  DP +1 Neuro: Alert and oriented X 3. Psych:  Good affect, responds appropriately  EKG:NSR rate of 61 bpm. RBBB.  ASSESSMENT AND PLAN

## 2012-01-14 NOTE — Assessment & Plan Note (Signed)
He is followed by Dr. Ouida Sills for labs. He will adjust statin etc. if necessary.

## 2012-01-14 NOTE — Assessment & Plan Note (Signed)
Stable at present. No recurrent angina. Continue current medications. We will see him annually unless he becomes symptomatic.

## 2012-01-14 NOTE — Assessment & Plan Note (Signed)
Well controlled. No changes in medication are necessary.

## 2012-03-31 ENCOUNTER — Other Ambulatory Visit: Payer: Self-pay | Admitting: Adult Health

## 2012-04-19 DIAGNOSIS — E119 Type 2 diabetes mellitus without complications: Secondary | ICD-10-CM | POA: Diagnosis not present

## 2012-04-21 DIAGNOSIS — H2589 Other age-related cataract: Secondary | ICD-10-CM | POA: Diagnosis not present

## 2012-04-21 DIAGNOSIS — H2510 Age-related nuclear cataract, unspecified eye: Secondary | ICD-10-CM | POA: Diagnosis not present

## 2012-04-26 DIAGNOSIS — E119 Type 2 diabetes mellitus without complications: Secondary | ICD-10-CM | POA: Diagnosis not present

## 2012-04-26 DIAGNOSIS — I2510 Atherosclerotic heart disease of native coronary artery without angina pectoris: Secondary | ICD-10-CM | POA: Diagnosis not present

## 2012-06-17 ENCOUNTER — Other Ambulatory Visit: Payer: Self-pay | Admitting: Adult Health

## 2012-08-05 DIAGNOSIS — H18419 Arcus senilis, unspecified eye: Secondary | ICD-10-CM | POA: Diagnosis not present

## 2012-08-05 DIAGNOSIS — H2510 Age-related nuclear cataract, unspecified eye: Secondary | ICD-10-CM | POA: Diagnosis not present

## 2012-08-08 ENCOUNTER — Telehealth: Payer: Self-pay | Admitting: *Deleted

## 2012-08-08 NOTE — Telephone Encounter (Signed)
Called pt to advise apt needed, pt advised we will need to contact his daughter Jacob Little to schedule this apt for him, daughter Jacob Glenn accepted apt for tomorrow 08-09-12 at 1:40pm with [facility], paperwork given to [facility] nurse to pend until apt

## 2012-08-08 NOTE — Telephone Encounter (Signed)
Noted incoming fax concerning pt upcoming surgery with [facility], fax advised pt will need upcoming apt for clearance, tried to call pt however no answer, voicemail capability noted, will try to contact pt again later today

## 2012-08-09 ENCOUNTER — Encounter: Payer: Self-pay | Admitting: Adult Health

## 2012-08-09 ENCOUNTER — Ambulatory Visit (INDEPENDENT_AMBULATORY_CARE_PROVIDER_SITE_OTHER): Payer: Medicare Other | Admitting: Adult Health

## 2012-08-09 VITALS — BP 160/80 | HR 68 | Wt 167.0 lb

## 2012-08-09 DIAGNOSIS — I25810 Atherosclerosis of coronary artery bypass graft(s) without angina pectoris: Secondary | ICD-10-CM

## 2012-08-09 DIAGNOSIS — I2510 Atherosclerotic heart disease of native coronary artery without angina pectoris: Secondary | ICD-10-CM

## 2012-08-09 DIAGNOSIS — I10 Essential (primary) hypertension: Secondary | ICD-10-CM

## 2012-08-09 NOTE — Patient Instructions (Addendum)
Your physician recommends that you schedule a follow-up appointment in: 6 months  May STOP Plavix 7 days prior to surgery

## 2012-08-09 NOTE — Assessment & Plan Note (Signed)
Slightly elevated today on this visit. Normally lower on previous appointments. Will continue to monitor his status. Continue current medications BB, ASA, and nitrates.

## 2012-08-09 NOTE — Progress Notes (Deleted)
Name: Jacob Little    DOB: 1936/05/25  Age: 77 y.o.  MR#: [id number]       PCP:  Carylon Perches, MD      Insurance: Payor: MEDICARE  Plan: MEDICARE PART B  Product Type: *No Product type*    CC:   No chief complaint on file.   VS Filed Vitals:   08/09/12 1335  BP: 160/80  Pulse: 68  Weight: 167 lb (75.751 kg)    Weights Current Weight  08/09/12 167 lb (75.751 kg)  01/14/12 165 lb 12 oz (75.184 kg)  05/18/11 166 lb (75.297 kg)    Blood Pressure  BP Readings from Last 3 Encounters:  08/09/12 160/80  01/14/12 134/70  05/18/11 126/70     Admit date:  (Not on file) Last encounter with RMR:  06/17/2012   Allergy Review of patient's allergies indicates no known allergies.  Current Outpatient Prescriptions  Medication Sig Dispense Refill  . aspirin 325 MG tablet Take 325 mg by mouth daily.        . clopidogrel (PLAVIX) 75 MG tablet TAKE 1 TABLET BY MOUTH ONCE DAILY  90 tablet  3  . isosorbide mononitrate (IMDUR) 30 MG 24 hr tablet TAKE 1 TABLET BY MOUTH ONCE DAILY  90 tablet  2  . metFORMIN (GLUCOPHAGE) 1000 MG tablet Take 1,000 mg by mouth 2 (two) times daily with a meal.        . metoprolol tartrate (LOPRESSOR) 25 MG tablet TAKE 1 TABLET BY MOUTH TWICE DAILY  180 tablet  2  . simvastatin (ZOCOR) 20 MG tablet TAKE 1 TABLET BY MOUTH ONCE DAILY  90 tablet  2   No current facility-administered medications for this visit.    Discontinued Meds:   There are no discontinued medications.  Patient Active Problem List  Diagnosis  . DM  . HYPERLIPIDEMIA  . HYPERTENSION  . ANGINA, ATYPICAL  . CORONARY ATHEROSCLEROSIS NATIVE CORONARY ARTERY  . DENTAL PAIN    LABS    Component Value Date/Time   NA 132* 06/30/2009 0515   NA 132 06/30/2009   NA 136 06/29/2009 1736   K 4.0 06/30/2009 0515   K 4.0 06/30/2009   K 3.9 06/29/2009 1736   CL 100 06/30/2009 0515   CL 100 06/30/2009   CL 103 06/29/2009 1736   CO2 26 06/30/2009 0515   CO2 26 06/30/2009   CO2 26 06/29/2009 1736   GLUCOSE 145*  06/30/2009 0515   GLUCOSE 145 06/30/2009   GLUCOSE 166* 06/29/2009 1736   BUN 7 06/30/2009 0515   BUN 7 06/30/2009   BUN 5* 06/29/2009 1736   CREATININE 0.76 06/30/2009 0515   CREATININE 0.76 06/30/2009   CREATININE 0.82 06/29/2009 1736   CALCIUM 8.7 06/30/2009 0515   CALCIUM 8.7 06/30/2009   CALCIUM 9.4 06/29/2009 1736   GFRNONAA >60 06/30/2009 0515   GFRNONAA >60 06/30/2009   GFRNONAA >60 06/29/2009 1736   GFRAA  Value: >60        The eGFR has been calculated using the MDRD equation. This calculation has not been validated in all clinical situations. eGFR's persistently <60 mL/min signify possible Chronic Kidney Disease. 06/30/2009 0515   GFRAA  Value: >60        The eGFR has been calculated using the MDRD equation. This calculation has not been validated in all clinical situations. eGFR's persistently <60 mL/min signify possible Chronic Kidney Disease. 06/29/2009 1736   CMP     Component Value Date/Time   NA 132* 06/30/2009  0515   K 4.0 06/30/2009 0515   CL 100 06/30/2009 0515   CO2 26 06/30/2009 0515   GLUCOSE 145* 06/30/2009 0515   BUN 7 06/30/2009 0515   CREATININE 0.76 06/30/2009 0515   CALCIUM 8.7 06/30/2009 0515   PROT 6.2 06/30/2009 0515   ALBUMIN 3.7 06/30/2009 0515   AST 18 06/30/2009 0515   ALT 17 06/30/2009 0515   ALKPHOS 40 06/30/2009 0515   BILITOT 0.5 06/30/2009 0515   GFRNONAA >60 06/30/2009 0515   GFRAA  Value: >60        The eGFR has been calculated using the MDRD equation. This calculation has not been validated in all clinical situations. eGFR's persistently <60 mL/min signify possible Chronic Kidney Disease. 06/30/2009 0515       Component Value Date/Time   WBC 10.3 06/30/2009 0515   WBC 8.2 06/29/2009 1736   HGB 12.5* 06/30/2009 0515   HGB 13.4 06/29/2009 1736   HCT 36.0* 06/30/2009 0515   HCT 39.1 06/29/2009 1736   MCV 92.4 06/30/2009 0515   MCV 92.2 06/29/2009 1736    Lipid Panel  No results found for this basename: chol, trig, hdl, cholhdl, vldl, ldlcalc    ABG No results  found for this basename: phart, pco2, pco2art, po2, po2art, hco3, tco2, acidbasedef, o2sat     No results found for this basename: TSH   BNP (last 3 results) No results found for this basename: PROBNP,  in the last 8760 hours Cardiac Panel (last 3 results) No results found for this basename: CKTOTAL, CKMB, TROPONINI, RELINDX,  in the last 72 hours  Iron/TIBC/Ferritin No results found for this basename: iron, tibc, ferritin     EKG Orders placed in visit on 08/09/12  . EKG 12-LEAD     Prior Assessment and Plan Problem List as of 08/09/2012     ICD-9-CM     Cardiology Problems   HYPERLIPIDEMIA   Last Assessment & Plan   01/14/2012 Office Visit Written 01/14/2012  2:45 PM by Jodelle Gross, NP     He is followed by Dr. Ouida Sills for labs. He will adjust statin etc. if necessary.    HYPERTENSION   Last Assessment & Plan   01/14/2012 Office Visit Written 01/14/2012  2:44 PM by Jodelle Gross, NP     Well controlled. No changes in medication are necessary.    ANGINA, ATYPICAL   CORONARY ATHEROSCLEROSIS NATIVE CORONARY ARTERY   Last Assessment & Plan   01/14/2012 Office Visit Written 01/14/2012  2:45 PM by Jodelle Gross, NP     Stable at present. No recurrent angina. Continue current medications. We will see him annually unless he becomes symptomatic.      Other   DM   DENTAL PAIN       Imaging: No results found.

## 2012-08-09 NOTE — Progress Notes (Signed)
   HPI: Jacob Little is a 77 y/o patient of Dr. Dietrich Pates we are following for ongoing assessment and treatment of CAD with DES of the CX in 2011, hypertension, diabetes and hypercholesterolemia. He continues medically compliant and is without complaints of chest pain, DOE or weakness. He comes today without cardiac complaint. He is due to have bilateral cataract surgery in the next month and need cardiac evaluation prior to surgery. He remains active and is anxious to have his cataract surgery completed ASAP.   No Known Allergies  Current Outpatient Prescriptions  Medication Sig Dispense Refill  . aspirin 325 MG tablet Take 325 mg by mouth daily.        . clopidogrel (PLAVIX) 75 MG tablet TAKE 1 TABLET BY MOUTH ONCE DAILY  90 tablet  3  . isosorbide mononitrate (IMDUR) 30 MG 24 hr tablet TAKE 1 TABLET BY MOUTH ONCE DAILY  90 tablet  2  . metFORMIN (GLUCOPHAGE) 1000 MG tablet Take 1,000 mg by mouth 2 (two) times daily with a meal.        . metoprolol tartrate (LOPRESSOR) 25 MG tablet TAKE 1 TABLET BY MOUTH TWICE DAILY  180 tablet  2  . simvastatin (ZOCOR) 20 MG tablet TAKE 1 TABLET BY MOUTH ONCE DAILY  90 tablet  2   No current facility-administered medications for this visit.    Past Medical History  Diagnosis Date  . Coronary artery disease   . Diabetes mellitus   . Hyperlipidemia   . Hypertension     Past Surgical History  Procedure Laterality Date  . Cardiac catheterization       Single-vessel coronary artery disease with patent stent in the     [id number] of systems complete and found to be negative unless listed above  PHYSICAL EXAM BP 160/80  Pulse 68  Wt 167 lb (75.751 kg)  BMI 25.4 kg/m2  General: Well developed, well nourished, in no acute distress Head: Eyes PERRLA, No xanthomas. Right eye watering a lot.   Normal cephalic and atramatic  Lungs: Clear bilaterally to auscultation and percussion. Heart: HRRR S1 S2, without MRG.  Pulses are 2+ & equal.            No  carotid bruit. No JVD.  No abdominal bruits. No femoral bruits. Abdomen: Bowel sounds are positive, abdomen soft and non-tender without masses or                  Hernia's noted. Msk:  Back normal, normal gait. Normal strength and tone for age. Extremities: No clubbing, cyanosis or edema.  DP +1 Neuro: Alert and oriented X 3. Psych:  Good affect, responds appropriately  EKG: NSR with PAC's. Rate of 66 bpm  ASSESSMENT AND PLAN

## 2012-08-09 NOTE — Assessment & Plan Note (Signed)
He is without cardiac complaint. He remains on Plavix since 2011 stent placement of the Cx ACC guidelines recommend that he can be discontinued on Plavix after one year. He will definitely need to stop this for 7 days prior to cataract surgery and can probably be stopped indefinitely thereafter. He is a little nervous about stopping the plavix entirely with his stent having restenosis after initial placement in 2006. Once he is safe from surgical standpoint he can restart this medication. He is ok to proceed with cataract surgery with low likelihood of cardiac event. Continue all other cardiac medications as directed.

## 2012-08-16 DIAGNOSIS — E119 Type 2 diabetes mellitus without complications: Secondary | ICD-10-CM | POA: Diagnosis not present

## 2012-08-22 DIAGNOSIS — H2510 Age-related nuclear cataract, unspecified eye: Secondary | ICD-10-CM | POA: Diagnosis not present

## 2012-09-12 DIAGNOSIS — H269 Unspecified cataract: Secondary | ICD-10-CM | POA: Diagnosis not present

## 2012-09-12 DIAGNOSIS — H2510 Age-related nuclear cataract, unspecified eye: Secondary | ICD-10-CM | POA: Diagnosis not present

## 2012-09-15 DIAGNOSIS — E119 Type 2 diabetes mellitus without complications: Secondary | ICD-10-CM | POA: Diagnosis not present

## 2012-09-15 DIAGNOSIS — I2510 Atherosclerotic heart disease of native coronary artery without angina pectoris: Secondary | ICD-10-CM | POA: Diagnosis not present

## 2013-01-13 DIAGNOSIS — I2510 Atherosclerotic heart disease of native coronary artery without angina pectoris: Secondary | ICD-10-CM | POA: Diagnosis not present

## 2013-01-13 DIAGNOSIS — Z79899 Other long term (current) drug therapy: Secondary | ICD-10-CM | POA: Diagnosis not present

## 2013-01-13 DIAGNOSIS — E785 Hyperlipidemia, unspecified: Secondary | ICD-10-CM | POA: Diagnosis not present

## 2013-01-13 DIAGNOSIS — E119 Type 2 diabetes mellitus without complications: Secondary | ICD-10-CM | POA: Diagnosis not present

## 2013-01-13 DIAGNOSIS — Z125 Encounter for screening for malignant neoplasm of prostate: Secondary | ICD-10-CM | POA: Diagnosis not present

## 2013-01-19 DIAGNOSIS — E119 Type 2 diabetes mellitus without complications: Secondary | ICD-10-CM | POA: Diagnosis not present

## 2013-01-19 DIAGNOSIS — I2510 Atherosclerotic heart disease of native coronary artery without angina pectoris: Secondary | ICD-10-CM | POA: Diagnosis not present

## 2013-02-10 ENCOUNTER — Encounter: Payer: Self-pay | Admitting: Adult Health

## 2013-02-10 ENCOUNTER — Ambulatory Visit (INDEPENDENT_AMBULATORY_CARE_PROVIDER_SITE_OTHER): Payer: Medicare Other | Admitting: Adult Health

## 2013-02-10 VITALS — BP 118/73 | HR 64 | Ht 70.0 in | Wt 164.0 lb

## 2013-02-10 DIAGNOSIS — E785 Hyperlipidemia, unspecified: Secondary | ICD-10-CM

## 2013-02-10 DIAGNOSIS — I10 Essential (primary) hypertension: Secondary | ICD-10-CM

## 2013-02-10 DIAGNOSIS — I2510 Atherosclerotic heart disease of native coronary artery without angina pectoris: Secondary | ICD-10-CM

## 2013-02-10 DIAGNOSIS — I209 Angina pectoris, unspecified: Secondary | ICD-10-CM

## 2013-02-10 NOTE — Patient Instructions (Signed)
Your physician recommends that you schedule a follow-up appointment in: 6 MONTHS You will receive a reminder letter two months in advance reminding you to call and schedule your appointment. If you don't receive this letter, please contact our office.  Your physician recommends that you return for lab work this week. Slips given Fasting Lipids, LFTs, BMET

## 2013-02-10 NOTE — Progress Notes (Deleted)
Name: Jacob Little    DOB: Sep 07, 1935  Age: 77 y.o.  MR#: [id number]       PCP:  Carylon Perches, MD      Insurance: Payor: MEDICARE / Plan: MEDICARE PART B / Product Type: *No Product type* /   CC:   No chief complaint on file.   VS Filed Vitals:   02/10/13 1423  BP: 118/73  Pulse: 64  Height: 5\' 10"  (1.778 m)  Weight: 164 lb (74.39 kg)    Weights Current Weight  02/10/13 164 lb (74.39 kg)  08/09/12 167 lb (75.751 kg)  01/14/12 165 lb 12 oz (75.184 kg)    Blood Pressure  BP Readings from Last 3 Encounters:  02/10/13 118/73  08/09/12 160/80  01/14/12 134/70     Admit date:  (Not on file) Last encounter with RMR:  Visit date not found   Allergy Review of patient's allergies indicates no known allergies.  Current Outpatient Prescriptions  Medication Sig Dispense Refill  . aspirin 325 MG tablet Take 325 mg by mouth daily.        . clopidogrel (PLAVIX) 75 MG tablet TAKE 1 TABLET BY MOUTH ONCE DAILY  90 tablet  3  . isosorbide mononitrate (IMDUR) 30 MG 24 hr tablet TAKE 1 TABLET BY MOUTH ONCE DAILY  90 tablet  2  . metFORMIN (GLUCOPHAGE) 1000 MG tablet Take 1,000 mg by mouth 2 (two) times daily with a meal.        . metoprolol tartrate (LOPRESSOR) 25 MG tablet TAKE 1 TABLET BY MOUTH TWICE DAILY  180 tablet  2  . simvastatin (ZOCOR) 20 MG tablet TAKE 1 TABLET BY MOUTH ONCE DAILY  90 tablet  2   No current facility-administered medications for this visit.    Discontinued Meds:   There are no discontinued medications.  Patient Active Problem List   Diagnosis Date Noted  . DENTAL PAIN 05/12/2010  . CORONARY ATHEROSCLEROSIS NATIVE CORONARY ARTERY 07/22/2009  . DM 07/12/2009  . HYPERLIPIDEMIA 07/12/2009  . HYPERTENSION 07/12/2009  . ANGINA, ATYPICAL 07/12/2009    LABS    Component Value Date/Time   NA 132* 06/30/2009 0515   NA 132 06/30/2009   NA 136 06/29/2009 1736   K 4.0 06/30/2009 0515   K 4.0 06/30/2009   K 3.9 06/29/2009 1736   CL 100 06/30/2009 0515   CL 100  06/30/2009   CL 103 06/29/2009 1736   CO2 26 06/30/2009 0515   CO2 26 06/30/2009   CO2 26 06/29/2009 1736   GLUCOSE 145* 06/30/2009 0515   GLUCOSE 145 06/30/2009   GLUCOSE 166* 06/29/2009 1736   BUN 7 06/30/2009 0515   BUN 7 06/30/2009   BUN 5* 06/29/2009 1736   CREATININE 0.76 06/30/2009 0515   CREATININE 0.76 06/30/2009   CREATININE 0.82 06/29/2009 1736   CALCIUM 8.7 06/30/2009 0515   CALCIUM 8.7 06/30/2009   CALCIUM 9.4 06/29/2009 1736   GFRNONAA >60 06/30/2009 0515   GFRNONAA >60 06/30/2009   GFRNONAA >60 06/29/2009 1736   GFRAA  Value: >60        The eGFR has been calculated using the MDRD equation. This calculation has not been validated in all clinical situations. eGFR's persistently <60 mL/min signify possible Chronic Kidney Disease. 06/30/2009 0515   GFRAA  Value: >60        The eGFR has been calculated using the MDRD equation. This calculation has not been validated in all clinical situations. eGFR's persistently <60 mL/min signify possible Chronic Kidney  Disease. 06/29/2009 1736   CMP     Component Value Date/Time   NA 132* 06/30/2009 0515   K 4.0 06/30/2009 0515   CL 100 06/30/2009 0515   CO2 26 06/30/2009 0515   GLUCOSE 145* 06/30/2009 0515   BUN 7 06/30/2009 0515   CREATININE 0.76 06/30/2009 0515   CALCIUM 8.7 06/30/2009 0515   PROT 6.2 06/30/2009 0515   ALBUMIN 3.7 06/30/2009 0515   AST 18 06/30/2009 0515   ALT 17 06/30/2009 0515   ALKPHOS 40 06/30/2009 0515   BILITOT 0.5 06/30/2009 0515   GFRNONAA >60 06/30/2009 0515   GFRAA  Value: >60        The eGFR has been calculated using the MDRD equation. This calculation has not been validated in all clinical situations. eGFR's persistently <60 mL/min signify possible Chronic Kidney Disease. 06/30/2009 0515       Component Value Date/Time   WBC 10.3 06/30/2009 0515   WBC 8.2 06/29/2009 1736   HGB 12.5* 06/30/2009 0515   HGB 13.4 06/29/2009 1736   HCT 36.0* 06/30/2009 0515   HCT 39.1 06/29/2009 1736   MCV 92.4 06/30/2009 0515   MCV 92.2 06/29/2009 1736     Lipid Panel  No results found for this basename: chol, trig, hdl, cholhdl, vldl, ldlcalc    ABG No results found for this basename: phart, pco2, pco2art, po2, po2art, hco3, tco2, acidbasedef, o2sat     No results found for this basename: TSH   BNP (last 3 results) No results found for this basename: PROBNP,  in the last 8760 hours Cardiac Panel (last 3 results) No results found for this basename: CKTOTAL, CKMB, TROPONINI, RELINDX,  in the last 72 hours  Iron/TIBC/Ferritin No results found for this basename: iron, tibc, ferritin     EKG Orders placed in visit on 02/10/13  . EKG 12-LEAD     Prior Assessment and Plan Problem List as of 02/10/2013     Cardiovascular and Mediastinum   HYPERTENSION   Last Assessment & Plan   08/09/2012 Office Visit Written 08/09/2012  2:33 PM by Jodelle Gross, NP     Slightly elevated today on this visit. Normally lower on previous appointments. Will continue to monitor his status. Continue current medications BB, ASA, and nitrates.    ANGINA, ATYPICAL   CORONARY ATHEROSCLEROSIS NATIVE CORONARY ARTERY   Last Assessment & Plan   08/09/2012 Office Visit Written 08/09/2012  2:31 PM by Jodelle Gross, NP     He is without cardiac complaint. He remains on Plavix since 2011 stent placement of the Cx ACC guidelines recommend that he can be discontinued on Plavix after one year. He will definitely need to stop this for 7 days prior to cataract surgery and can probably be stopped indefinitely thereafter. He is a little nervous about stopping the plavix entirely with his stent having restenosis after initial placement in 2006. Once he is safe from surgical standpoint he can restart this medication. He is ok to proceed with cataract surgery with low likelihood of cardiac event. Continue all other cardiac medications as directed.       Digestive   DENTAL PAIN     Endocrine   DM     Other   HYPERLIPIDEMIA   Last Assessment & Plan   01/14/2012  Office Visit Written 01/14/2012  2:45 PM by Jodelle Gross, NP     He is followed by Dr. Ouida Sills for labs. He will adjust statin etc. if necessary.  Imaging: No results found.

## 2013-02-10 NOTE — Assessment & Plan Note (Signed)
Blood pressure is well controlled currently. No changes to medication regimen. Will check BMET.

## 2013-02-10 NOTE — Assessment & Plan Note (Signed)
He is stable from cardiac standpoint without recurrent chest discomfort or DOE. He continues on Plavix and could be potentially taken off of this now that it has been 3 years since placement. He has two DES in the CX. Some interventionalists would argue continued Plavix with mulitple stents. Will defer to follow up cardiologist for recommendations on stopping Plavix.

## 2013-02-10 NOTE — Progress Notes (Signed)
   HPI: Jacob Little is a 77 y/o patient of Dr.Rothbart we are following for ongoing assessment and treatment of CAD with DES X 2 to Cx in 2011, hypertension, and hypercholesterolemia. He also has diabetes followed by PCP, Dr.Fagen. He comes today without cardiac complaint. He is very talkative and hard of hearing.   No Known Allergies  Current Outpatient Prescriptions  Medication Sig Dispense Refill  . aspirin 325 MG tablet Take 325 mg by mouth daily.        . clopidogrel (PLAVIX) 75 MG tablet TAKE 1 TABLET BY MOUTH ONCE DAILY  90 tablet  3  . isosorbide mononitrate (IMDUR) 30 MG 24 hr tablet TAKE 1 TABLET BY MOUTH ONCE DAILY  90 tablet  2  . metFORMIN (GLUCOPHAGE) 1000 MG tablet Take 1,000 mg by mouth 2 (two) times daily with a meal.        . metoprolol tartrate (LOPRESSOR) 25 MG tablet TAKE 1 TABLET BY MOUTH TWICE DAILY  180 tablet  2  . simvastatin (ZOCOR) 20 MG tablet TAKE 1 TABLET BY MOUTH ONCE DAILY  90 tablet  2   No current facility-administered medications for this visit.    Past Medical History  Diagnosis Date  . Coronary artery disease   . Diabetes mellitus   . Hyperlipidemia   . Hypertension     Past Surgical History  Procedure Laterality Date  . Cardiac catheterization       Single-vessel coronary artery disease with patent stent in the     [id number] of systems complete and found to be negative unless listed above  PHYSICAL EXAM BP 118/73  Pulse 64  Ht 5\' 10"  (1.778 m)  Wt 164 lb (74.39 kg)  BMI 23.53 kg/m2  General: Well developed, well nourished, in no acute distress Head: Eyes PERRLA, No xanthomas.   Normal cephalic and atramatic  Lungs: Clear bilaterally to auscultation. Heart: HRRR S1 S2, without MRG.  Pulses are 2+ & equal.            No carotid bruit. No JVD.  Abdomen: Bowel sounds are positive, abdomen soft and non-tender, with central obesity, without masses or                  Hernia's noted. Msk:  Back normal, normal gait. Normal strength and  tone for age. Extremities: No clubbing, cyanosis or edema.  DP +1 Neuro: Alert and oriented X 3. Psych:  Good affect, responds appropriately  EKG: NSR with RBBB rate of 60 bpm.  ASSESSMENT AND PLAN

## 2013-02-10 NOTE — Assessment & Plan Note (Signed)
Fasting lipids and LFT's will be drawn as I do not have records of recent labs since 2011.

## 2013-04-18 ENCOUNTER — Other Ambulatory Visit: Payer: Self-pay

## 2013-04-18 ENCOUNTER — Other Ambulatory Visit: Payer: Self-pay | Admitting: Adult Health

## 2013-04-18 MED ORDER — CLOPIDOGREL BISULFATE 75 MG PO TABS: ORAL_TABLET | ORAL | Status: DC

## 2013-04-18 MED ORDER — ISOSORBIDE MONONITRATE ER 30 MG PO TB24: ORAL_TABLET | ORAL | Status: DC

## 2013-04-18 MED ORDER — METOPROLOL TARTRATE 25 MG PO TABS: ORAL_TABLET | ORAL | Status: DC

## 2013-04-18 MED ORDER — SIMVASTATIN 20 MG PO TABS: ORAL_TABLET | ORAL | Status: DC

## 2013-05-18 DIAGNOSIS — E119 Type 2 diabetes mellitus without complications: Secondary | ICD-10-CM | POA: Diagnosis not present

## 2013-05-25 DIAGNOSIS — I2510 Atherosclerotic heart disease of native coronary artery without angina pectoris: Secondary | ICD-10-CM | POA: Diagnosis not present

## 2013-05-25 DIAGNOSIS — Z23 Encounter for immunization: Secondary | ICD-10-CM | POA: Diagnosis not present

## 2013-05-25 DIAGNOSIS — E119 Type 2 diabetes mellitus without complications: Secondary | ICD-10-CM | POA: Diagnosis not present

## 2013-09-21 DIAGNOSIS — E119 Type 2 diabetes mellitus without complications: Secondary | ICD-10-CM | POA: Diagnosis not present

## 2013-09-28 DIAGNOSIS — E119 Type 2 diabetes mellitus without complications: Secondary | ICD-10-CM | POA: Diagnosis not present

## 2013-09-28 DIAGNOSIS — I2510 Atherosclerotic heart disease of native coronary artery without angina pectoris: Secondary | ICD-10-CM | POA: Diagnosis not present

## 2013-12-25 ENCOUNTER — Other Ambulatory Visit: Payer: Self-pay | Admitting: Adult Health

## 2014-01-23 DIAGNOSIS — E785 Hyperlipidemia, unspecified: Secondary | ICD-10-CM | POA: Diagnosis not present

## 2014-01-23 DIAGNOSIS — I2510 Atherosclerotic heart disease of native coronary artery without angina pectoris: Secondary | ICD-10-CM | POA: Diagnosis not present

## 2014-01-23 DIAGNOSIS — E119 Type 2 diabetes mellitus without complications: Secondary | ICD-10-CM | POA: Diagnosis not present

## 2014-01-23 DIAGNOSIS — Z79899 Other long term (current) drug therapy: Secondary | ICD-10-CM | POA: Diagnosis not present

## 2014-01-30 DIAGNOSIS — E119 Type 2 diabetes mellitus without complications: Secondary | ICD-10-CM | POA: Diagnosis not present

## 2014-01-30 DIAGNOSIS — I2510 Atherosclerotic heart disease of native coronary artery without angina pectoris: Secondary | ICD-10-CM | POA: Diagnosis not present

## 2014-01-31 ENCOUNTER — Encounter: Payer: Self-pay | Admitting: Physician Assistant

## 2014-01-31 ENCOUNTER — Ambulatory Visit (INDEPENDENT_AMBULATORY_CARE_PROVIDER_SITE_OTHER): Payer: Medicare Other | Admitting: Physician Assistant

## 2014-01-31 VITALS — BP 136/72 | HR 60 | Ht 70.0 in | Wt 162.0 lb

## 2014-01-31 DIAGNOSIS — I10 Essential (primary) hypertension: Secondary | ICD-10-CM

## 2014-01-31 DIAGNOSIS — E785 Hyperlipidemia, unspecified: Secondary | ICD-10-CM | POA: Diagnosis not present

## 2014-01-31 DIAGNOSIS — I2510 Atherosclerotic heart disease of native coronary artery without angina pectoris: Secondary | ICD-10-CM

## 2014-01-31 MED ORDER — METOPROLOL TARTRATE 25 MG PO TABS: ORAL_TABLET | ORAL | Status: DC

## 2014-01-31 MED ORDER — SIMVASTATIN 20 MG PO TABS: ORAL_TABLET | ORAL | Status: DC

## 2014-01-31 MED ORDER — ISOSORBIDE MONONITRATE ER 30 MG PO TB24: ORAL_TABLET | ORAL | Status: DC

## 2014-01-31 MED ORDER — CLOPIDOGREL BISULFATE 75 MG PO TABS: ORAL_TABLET | ORAL | Status: DC

## 2014-01-31 NOTE — Patient Instructions (Signed)
Your physician wants you to follow-up in: 6 months with Dr. Harl Bowie or Dr. Bronson Ing. You will receive a reminder letter in the mail two months in advance. If you don't receive a letter, please call our office to schedule the follow-up appointment.  Your physician recommends that you continue on your current medications as directed. Please refer to the Current Medication list given to you today.  I have refilled your medications  Thank you for choosing [facility]!!\

## 2014-01-31 NOTE — Assessment & Plan Note (Signed)
Blood pressure controlled. 

## 2014-01-31 NOTE — Assessment & Plan Note (Signed)
Stable without recurrent chest pain. Continue current medications

## 2014-01-31 NOTE — Assessment & Plan Note (Signed)
Lipid panel reviewed that was drawn by Dr. Willey Blade and is excellent

## 2014-01-31 NOTE — Progress Notes (Signed)
HPI: This is a 78 year old male patient who is followed by Dr. Lattie Haw and has coronary artery disease status post drug-eluting stent x2 to the circumflex in 2011. He also has hypertension, hypercholesterolemia these mellitus.  Patient comes in for his yearly followup. He says his memory has become poor but he had a checkup with Dr. Willey Blade yesterday and had an excellent report. He thinks he had chest pain about a year ago but can't remember. He works in his garden but doesn't do much more than that. He denies any chest pain, palpitations, dyspnea, dizziness or presyncope. He says occasionally he gets out of breath with exertion.  No Known Allergies   Current Outpatient Prescriptions  Medication Sig Dispense Refill  . aspirin 325 MG tablet Take 325 mg by mouth daily.        . clopidogrel (PLAVIX) 75 MG tablet TAKE 1 TABLET BY MOUTH ONCE DAILY  90 tablet  3  . isosorbide mononitrate (IMDUR) 30 MG 24 hr tablet TAKE 1 TABLET BY MOUTH ONCE DAILY  60 tablet  0  . metFORMIN (GLUCOPHAGE) 1000 MG tablet Take 1,000 mg by mouth 2 (two) times daily with a meal.        . metoprolol tartrate (LOPRESSOR) 25 MG tablet TAKE 1 TABLET BY MOUTH TWICE DAILY  120 tablet  0  . simvastatin (ZOCOR) 20 MG tablet TAKE 1 TABLET BY MOUTH ONCE DAILY  60 tablet  0   No current facility-administered medications for this visit.    Past Medical History  Diagnosis Date  . Coronary artery disease   . Diabetes mellitus   . Hyperlipidemia   . Hypertension     Past Surgical History  Procedure Laterality Date  . Cardiac catheterization       Single-vessel coronary artery disease with patent stent in the     No family history on file.  History   Social History  . Marital Status: Widowed    Spouse Name: N/A    Number of Children: N/A  . Years of Education: N/A   Occupational History  . Not on file.   Social History Main Topics  . Smoking status: Former Smoker    Quit date: 06/15/2005  .  Smokeless tobacco: Not on file  . Alcohol Use: Not on file  . Drug Use: Not on file  . Sexual Activity: Not on file   Other Topics Concern  . Not on file   Social History Narrative  . No narrative on file    ROS: See history of present illness otherwise negative  BP 136/72  Pulse 60  Ht 5\' 10"  (1.778 m)  Wt 162 lb (73.483 kg)  BMI 23.24 kg/m2  SpO2 98%  PHYSICAL EXAM: Well-nournished, disheveled, poor dentition, in no acute distress. Neck: No JVD, HJR, Bruit, or thyroid enlargement  Lungs: Decreased breath sounds but No tachypnea, clear without wheezing, rales, or rhonchi  Cardiovascular: RRR, PMI not displaced, heart sounds normal, no murmurs, gallops, bruit, thrill, or heave.  Abdomen: BS normal. Soft without organomegaly, masses, lesions or tenderness.  Extremities: without cyanosis, clubbing or edema. Good distal pulses bilateral  SKin: Warm, no lesions or rashes   Musculoskeletal: No deformities  Neuro: no focal signs   Wt Readings from Last 3 Encounters:  01/31/14 162 lb (73.483 kg)  02/10/13 164 lb (74.39 kg)  08/09/12 167 lb (75.751 kg)     EKG: Normal sinus rhythm with right bundle branch block unchanged from prior tracings

## 2014-02-05 ENCOUNTER — Other Ambulatory Visit: Payer: Self-pay | Admitting: *Deleted

## 2014-02-05 ENCOUNTER — Telehealth: Payer: Self-pay | Admitting: Physician Assistant

## 2014-02-05 MED ORDER — ISOSORBIDE MONONITRATE ER 30 MG PO TB24: ORAL_TABLET | ORAL | Status: DC

## 2014-02-05 MED ORDER — CLOPIDOGREL BISULFATE 75 MG PO TABS: ORAL_TABLET | ORAL | Status: DC

## 2014-02-05 MED ORDER — SIMVASTATIN 20 MG PO TABS: ORAL_TABLET | ORAL | Status: DC

## 2014-02-05 MED ORDER — METOPROLOL TARTRATE 25 MG PO TABS: ORAL_TABLET | ORAL | Status: DC

## 2014-02-05 NOTE — Telephone Encounter (Signed)
Please see refill bin / tgs  °

## 2014-02-05 NOTE — Progress Notes (Signed)
Daughter called needed 75 day supply for insurance. Changed

## 2014-02-05 NOTE — Progress Notes (Signed)
Received fax from [facility] for refills of simvastatin, metoprolol, isosorbide, plavix, already refilled on 01/31/14 with refills

## 2014-02-06 ENCOUNTER — Encounter: Payer: Self-pay | Admitting: Adult Health

## 2014-05-22 DIAGNOSIS — E119 Type 2 diabetes mellitus without complications: Secondary | ICD-10-CM | POA: Diagnosis not present

## 2014-05-29 DIAGNOSIS — I2510 Atherosclerotic heart disease of native coronary artery without angina pectoris: Secondary | ICD-10-CM | POA: Diagnosis not present

## 2014-05-29 DIAGNOSIS — E119 Type 2 diabetes mellitus without complications: Secondary | ICD-10-CM | POA: Diagnosis not present

## 2014-05-29 DIAGNOSIS — Z23 Encounter for immunization: Secondary | ICD-10-CM | POA: Diagnosis not present

## 2014-09-20 DIAGNOSIS — E119 Type 2 diabetes mellitus without complications: Secondary | ICD-10-CM | POA: Diagnosis not present

## 2014-09-27 DIAGNOSIS — E119 Type 2 diabetes mellitus without complications: Secondary | ICD-10-CM | POA: Diagnosis not present

## 2014-09-27 DIAGNOSIS — I2510 Atherosclerotic heart disease of native coronary artery without angina pectoris: Secondary | ICD-10-CM | POA: Diagnosis not present

## 2015-01-21 DIAGNOSIS — I2510 Atherosclerotic heart disease of native coronary artery without angina pectoris: Secondary | ICD-10-CM | POA: Diagnosis not present

## 2015-01-21 DIAGNOSIS — E119 Type 2 diabetes mellitus without complications: Secondary | ICD-10-CM | POA: Diagnosis not present

## 2015-01-21 DIAGNOSIS — Z79899 Other long term (current) drug therapy: Secondary | ICD-10-CM | POA: Diagnosis not present

## 2015-01-28 DIAGNOSIS — Z6825 Body mass index (BMI) 25.0-25.9, adult: Secondary | ICD-10-CM | POA: Diagnosis not present

## 2015-01-28 DIAGNOSIS — I2510 Atherosclerotic heart disease of native coronary artery without angina pectoris: Secondary | ICD-10-CM | POA: Diagnosis not present

## 2015-01-28 DIAGNOSIS — E119 Type 2 diabetes mellitus without complications: Secondary | ICD-10-CM | POA: Diagnosis not present

## 2015-02-26 ENCOUNTER — Other Ambulatory Visit: Payer: Self-pay | Admitting: *Deleted

## 2015-02-26 MED ORDER — CLOPIDOGREL BISULFATE 75 MG PO TABS: ORAL_TABLET | ORAL | Status: DC

## 2015-02-26 MED ORDER — SIMVASTATIN 20 MG PO TABS: ORAL_TABLET | ORAL | Status: DC

## 2015-02-26 MED ORDER — METOPROLOL TARTRATE 25 MG PO TABS: ORAL_TABLET | ORAL | Status: DC

## 2015-03-18 ENCOUNTER — Ambulatory Visit (INDEPENDENT_AMBULATORY_CARE_PROVIDER_SITE_OTHER): Payer: Medicare Other | Admitting: Cardiology

## 2015-03-18 ENCOUNTER — Other Ambulatory Visit: Payer: Self-pay | Admitting: Physician Assistant

## 2015-03-18 ENCOUNTER — Encounter: Payer: Self-pay | Admitting: Cardiology

## 2015-03-18 VITALS — BP 130/70 | HR 68 | Ht 69.0 in | Wt 161.0 lb

## 2015-03-18 DIAGNOSIS — I10 Essential (primary) hypertension: Secondary | ICD-10-CM | POA: Diagnosis not present

## 2015-03-18 DIAGNOSIS — E785 Hyperlipidemia, unspecified: Secondary | ICD-10-CM | POA: Diagnosis not present

## 2015-03-18 DIAGNOSIS — I2510 Atherosclerotic heart disease of native coronary artery without angina pectoris: Secondary | ICD-10-CM | POA: Diagnosis not present

## 2015-03-18 NOTE — Progress Notes (Signed)
Cardiology Office Note  Date: 03/18/2015   ID: Jacob Little, DOB 24-Aug-1935, MRN [id number]  PCP: [name], MD  Primary Cardiologist: Rozann Lesches, MD   Chief Complaint  Patient presents with  . Coronary Artery Disease    History of Present Illness: Jacob Little is a 79 y.o. male former patient of Dr. Lattie Haw, presenting for a follow-up visit. He was last seen by Ms. Vita Barley and August 2015. I reviewed his chart and updated his history. He has done well over the years since DES to the circumflex in 2006. He did undergo a follow-up cardiac catheterization in 2011 which demonstrated 50% LAD stenosis, patent stent site within the circumflex associated with 20% in-stent restenosis, 50% obtuse marginal, and 50-60% stenosis within the RCA. Medical therapy was recommended that time. He has not had follow-up ischemic surveillance testing.  We discussed his medications which are outlined below. He does not report any angina symptoms. He lives alone, his wife died about 11 years ago. He takes care of his basic ADLs. He has 2 daughters, one is a Radio producer, and the other is a Software engineer.  Interval follow-up continues with Dr. Willey Blade. Lipids have been managed by Dr. Willey Blade on Zocor, he will be having follow-up lab work in December.  ECG today shows sinus rhythm with right bundle branch block and nonspecific ST changes, PAC.    Past Medical History  Diagnosis Date  . Coronary artery disease     DES to circumflex 2006  . Type 2 diabetes mellitus ([place])   . Hyperlipidemia   . Essential hypertension   . Rheumatic fever     Past Surgical History  Procedure Laterality Date  . Appendectomy      Current Outpatient Prescriptions  Medication Sig Dispense Refill  . aspirin 325 MG tablet Take 325 mg by mouth daily.      . clopidogrel (PLAVIX) 75 MG tablet TAKE 1 TABLET BY MOUTH ONCE DAILY 90 tablet 3  . isosorbide mononitrate (IMDUR) 30 MG 24 hr tablet TAKE 1 TABLET BY MOUTH ONCE  DAILY 90 tablet 3  . metFORMIN (GLUCOPHAGE) 1000 MG tablet Take 1,000 mg by mouth 2 (two) times daily with a meal.      . metoprolol tartrate (LOPRESSOR) 25 MG tablet TAKE 1 TABLET BY MOUTH TWICE DAILY 180 tablet 3  . simvastatin (ZOCOR) 20 MG tablet TAKE 1 TABLET BY MOUTH ONCE DAILY 90 tablet 3   No current facility-administered medications for this visit.    Allergies:  Review of patient's allergies indicates no known allergies.   Social History: The patient  reports that he quit smoking about 9 years ago. His smoking use included Cigarettes. He does not have any smokeless tobacco history on file. He reports that he drinks alcohol. He reports that he does not use illicit drugs.   ROS:  Please see the history of present illness. Otherwise, complete review of systems is positive for memory problems.  All other systems are reviewed and negative.   Physical Exam: VS:  BP 130/70 mmHg  Pulse 68  Ht 5\' 9"  (1.753 m)  Wt 161 lb (73.029 kg)  BMI 23.76 kg/m2  SpO2 97%, BMI Body mass index is 23.76 kg/(m^2).  Wt Readings from Last 3 Encounters:  03/18/15 161 lb (73.029 kg)  01/31/14 162 lb (73.483 kg)  02/10/13 164 lb (74.39 kg)     General: Patient appears comfortable at rest. HEENT: Conjunctiva and lids normal, oropharynx clear. Neck: Supple, no elevated JVP or  carotid bruits, no thyromegaly. Lungs: Clear to auscultation, nonlabored breathing at rest. Cardiac: Regular rate and rhythm, no S3, soft systolic murmur, no pericardial rub. Abdomen: Soft, nontender, bowel sounds present. Extremities: No pitting edema, distal pulses 2+. Skin: Warm and dry. Musculoskeletal: No kyphosis. Neuropsychiatric: Alert and oriented x3, affect grossly appropriate.   ECG: ECG is ordered today.  Recent Labwork: August 2015: Hemoglobin 12.6, platelets 238, potassium 4.7, BUN 11, creatinine 0.7, AST 12, ALT 9, cholesterol 106  Other Studies Reviewed Today:  Echocardiogram 05/20/2011: Study  Conclusions  - Left ventricle: The cavity size was normal. There was mild concentric hypertrophy. Systolic function was normal. The estimated ejection fraction was in the range of 55% to 60%. Wall motion was normal; there were no regional wall motion abnormalities. - Aortic valve: Mildly calcified annulus. Trileaflet; mildly thickened, mildly calcified leaflets. Trivial regurgitation. - Mitral valve: Calcified annulus. - Right atrium: The atrium was mildly dilated. - Atrial septum: No defect or patent foramen ovale was identified. - Pulmonary arteries: PA peak pressure: 71mm Hg (S).  ASSESSMENT AND PLAN:  1. CAD status post DES to the circumflex in 2006. Angiography in 2011 showed moderate multivessel disease with patent stent site. He has been managed medically and does not endorse any angina at this time. I did discuss with him proceeding with a Lexiscan Cardiolite to reassess ischemic burden on medical therapy. He states that he will discuss this with his daughter and call us back.  2. Hyperlipidemia, on Zocor. For follow-up lab work with Dr. Willey Blade in December.  3. Prior history of tobacco abuse, he quit approximately 10 years ago.  4. Essential hypertension, no change to current regimen.  Current medicines were reviewed at length with the patient today.   Orders Placed This Encounter  Procedures  . EKG 12-Lead    Disposition: FU with me in 1 year.   Signed, Satira Sark, MD, [facility] 03/18/2015 3:38 PM    [place] at [facility] [address] [place], [place] [address] Phone: [phone]; Fax: [phone]

## 2015-03-18 NOTE — Patient Instructions (Signed)
Your physician wants you to follow-up in: 1 year with Dr Ferne Reus will receive a reminder letter in the mail two months in advance. If you don't receive a letter, please call our office to schedule the follow-up appointment.    Your physician recommends that you continue on your current medications as directed. Please refer to the Current Medication list given to you today.    Dr Domenic Polite would like to do a  lexiscan cardiolte , please let us know if you are interested   Your physician has requested that you have a lexiscan myoview. For further information please visit [email or web address]. Please follow instruction sheet, as given.     Thank you for choosing [facility] !

## 2015-05-20 DIAGNOSIS — E119 Type 2 diabetes mellitus without complications: Secondary | ICD-10-CM | POA: Diagnosis not present

## 2015-06-11 DIAGNOSIS — Z23 Encounter for immunization: Secondary | ICD-10-CM | POA: Diagnosis not present

## 2015-06-11 DIAGNOSIS — I2510 Atherosclerotic heart disease of native coronary artery without angina pectoris: Secondary | ICD-10-CM | POA: Diagnosis not present

## 2015-06-11 DIAGNOSIS — E119 Type 2 diabetes mellitus without complications: Secondary | ICD-10-CM | POA: Diagnosis not present

## 2015-08-02 MED FILL — SIMVASTATIN 20 MG TABLET: 20 | 90 days supply | Qty: 90 | Fill #2

## 2015-08-02 MED FILL — CLOPIDOGREL 75 MG TABLET: 75 | 90 days supply | Qty: 90 | Fill #1

## 2015-08-02 MED FILL — metFORMIN HCL 1000 MG TABS: 1000 | 90 days supply | Qty: 180 | Fill #2

## 2015-08-02 MED FILL — METOPROLOL TARTRATE 25 MG T: 25 | 90 days supply | Qty: 180 | Fill #2

## 2015-08-02 MED FILL — ISOSORBIDE MN ER 30 MG TAB: 30 | 90 days supply | Qty: 90 | Fill #2

## 2015-10-01 DIAGNOSIS — E119 Type 2 diabetes mellitus without complications: Secondary | ICD-10-CM | POA: Diagnosis not present

## 2015-10-08 DIAGNOSIS — E119 Type 2 diabetes mellitus without complications: Secondary | ICD-10-CM | POA: Diagnosis not present

## 2015-10-08 DIAGNOSIS — I2510 Atherosclerotic heart disease of native coronary artery without angina pectoris: Secondary | ICD-10-CM | POA: Diagnosis not present

## 2015-10-08 DIAGNOSIS — Z23 Encounter for immunization: Secondary | ICD-10-CM | POA: Diagnosis not present

## 2015-10-23 MED FILL — metFORMIN HCL 1000 MG TABS: 1000 | 90 days supply | Qty: 180 | Fill #3 | Status: TO

## 2015-10-23 MED FILL — SIMVASTATIN 20 MG TABLET: 20 | 90 days supply | Qty: 90 | Fill #3

## 2015-10-23 MED FILL — METOPROLOL TARTRATE 25 MG T: 25 | 90 days supply | Qty: 180 | Fill #3

## 2015-10-23 MED FILL — ISOSORBIDE MN ER 30 MG TAB: 30 | 90 days supply | Qty: 90 | Fill #3 | Status: TO

## 2015-10-23 MED FILL — CLOPIDOGREL 75 MG TABLET: 75 | 90 days supply | Qty: 90 | Fill #2 | Status: TO

## 2016-01-29 ENCOUNTER — Other Ambulatory Visit: Payer: Self-pay | Admitting: Cardiology

## 2016-01-29 ENCOUNTER — Other Ambulatory Visit: Payer: Self-pay | Admitting: Physician Assistant

## 2016-01-29 MED FILL — ISOSORBIDE MN ER 30 MG TAB: 30 | 90 days supply | Qty: 90 | Fill #0

## 2016-01-29 MED FILL — metFORMIN HCL 1000 MG TABS: 1000 | 90 days supply | Qty: 180 | Fill #0

## 2016-01-29 MED FILL — METOPROLOL TARTRATE 25 MG T: 25 | 90 days supply | Qty: 180 | Fill #0

## 2016-01-29 MED FILL — CLOPIDOGREL 75 MG TABLET: 75 | 90 days supply | Qty: 90 | Fill #0

## 2016-01-31 ENCOUNTER — Other Ambulatory Visit: Payer: Self-pay | Admitting: *Deleted

## 2016-01-31 MED ORDER — SIMVASTATIN 20 MG PO TABS: ORAL_TABLET | ORAL | 0 refills | Status: DC

## 2016-01-31 MED FILL — SIMVASTATIN 20 MG TABLET: 20 | 90 days supply | Qty: 90 | Fill #0

## 2016-02-04 DIAGNOSIS — I2510 Atherosclerotic heart disease of native coronary artery without angina pectoris: Secondary | ICD-10-CM | POA: Diagnosis not present

## 2016-02-04 DIAGNOSIS — Z79899 Other long term (current) drug therapy: Secondary | ICD-10-CM | POA: Diagnosis not present

## 2016-02-04 DIAGNOSIS — E119 Type 2 diabetes mellitus without complications: Secondary | ICD-10-CM | POA: Diagnosis not present

## 2016-02-11 DIAGNOSIS — E119 Type 2 diabetes mellitus without complications: Secondary | ICD-10-CM | POA: Diagnosis not present

## 2016-02-11 DIAGNOSIS — I2510 Atherosclerotic heart disease of native coronary artery without angina pectoris: Secondary | ICD-10-CM | POA: Diagnosis not present

## 2016-02-11 DIAGNOSIS — E538 Deficiency of other specified B group vitamins: Secondary | ICD-10-CM | POA: Diagnosis not present

## 2016-02-25 DIAGNOSIS — H11153 Pinguecula, bilateral: Secondary | ICD-10-CM | POA: Diagnosis not present

## 2016-02-25 DIAGNOSIS — E119 Type 2 diabetes mellitus without complications: Secondary | ICD-10-CM | POA: Diagnosis not present

## 2016-02-25 DIAGNOSIS — Z961 Presence of intraocular lens: Secondary | ICD-10-CM | POA: Diagnosis not present

## 2016-02-25 DIAGNOSIS — H18413 Arcus senilis, bilateral: Secondary | ICD-10-CM | POA: Diagnosis not present

## 2016-03-30 ENCOUNTER — Ambulatory Visit: Payer: Medicare Other | Admitting: Cardiology

## 2016-03-30 NOTE — Progress Notes (Deleted)
Cardiology Office Note  Date: 03/30/2016   ID: Jacob Little, DOB Oct 30, 1935, MRN [id number]  PCP: [name], MD  Primary Cardiologist: Rozann Lesches, MD   No chief complaint on file.   History of Present Illness: Jacob Little is a 80 y.o. male last seen in October 2016.  At the last visit I recommended a follow-up Lexiscan Cardiolite for reassessment of ischemic burden, last coronary angiography was in 2011. He elected not to pursue this.  Past Medical History:  Diagnosis Date  . Coronary artery disease    DES to circumflex 2006  . Essential hypertension   . Hyperlipidemia   . Rheumatic fever   . Type 2 diabetes mellitus ([place])     Past Surgical History:  Procedure Laterality Date  . APPENDECTOMY      Current Outpatient Prescriptions  Medication Sig Dispense Refill  . aspirin 325 MG tablet Take 325 mg by mouth daily.      . clopidogrel (PLAVIX) 75 MG tablet TAKE 1 TABLET BY MOUTH ONCE DAILY 90 tablet 3  . isosorbide mononitrate (IMDUR) 30 MG 24 hr tablet TAKE 1 TABLET BY MOUTH ONCE DAILY 90 tablet 3  . metFORMIN (GLUCOPHAGE) 1000 MG tablet Take 1,000 mg by mouth 2 (two) times daily with a meal.      . metoprolol tartrate (LOPRESSOR) 25 MG tablet TAKE 1 TABLET BY MOUTH TWICE DAILY 180 tablet 3  . simvastatin (ZOCOR) 20 MG tablet TAKE 1 TABLET BY MOUTH ONCE DAILY 90 tablet 0   No current facility-administered medications for this visit.    Allergies:  Review of patient's allergies indicates no known allergies.   Social History: The patient  reports that he quit smoking about 10 years ago. His smoking use included Cigarettes. He does not have any smokeless tobacco history on file. He reports that he drinks alcohol. He reports that he does not use drugs.   Family History: The patient's family history is not on file.   ROS:  Please see the history of present illness. Otherwise, complete review of systems is positive for {NONE DEFAULTED:18576::"none"}.  All other  systems are reviewed and negative.   Physical Exam: VS:  There were no vitals taken for this visit., BMI There is no height or weight on file to calculate BMI.  Wt Readings from Last 3 Encounters:  03/18/15 161 lb (73 kg)  01/31/14 162 lb (73.5 kg)  02/10/13 164 lb (74.4 kg)    General: Patient appears comfortable at rest. HEENT: Conjunctiva and lids normal, oropharynx clear. Neck: Supple, no elevated JVP or carotid bruits, no thyromegaly. Lungs: Clear to auscultation, nonlabored breathing at rest. Cardiac: Regular rate and rhythm, no S3, soft systolic murmur, no pericardial rub. Abdomen: Soft, nontender, bowel sounds present. Extremities: No pitting edema, distal pulses 2+. Skin: Warm and dry. Musculoskeletal: No kyphosis. Neuropsychiatric: Alert and oriented x3, affect grossly appropriate.  ECG: I personally reviewed the tracing from 03/18/2015 which showed sinus rhythm with PAC, incomplete right bundle branch block.  Recent Labwork:  August 2015: Hemoglobin 12.6, platelets 238, potassium 4.7, BUN 11, creatinine 0.7, AST 12, ALT 9, cholesterol 106  Other Studies Reviewed Today:  Echocardiogram 05/20/2011: Study Conclusions  - Left ventricle: The cavity size was normal. There was mild concentric hypertrophy. Systolic function was normal. The estimated ejection fraction was in the range of 55% to 60%. Wall motion was normal; there were no regional wall motion abnormalities. - Aortic valve: Mildly calcified annulus. Trileaflet; mildly thickened, mildly calcified leaflets.  Trivial regurgitation. - Mitral valve: Calcified annulus. - Right atrium: The atrium was mildly dilated. - Atrial septum: No defect or patent foramen ovale was identified. - Pulmonary arteries: PA peak pressure: 21mm Hg (S).  Assessment and Plan:   Current medicines were reviewed with the patient today.  No orders of the defined types were placed in this  encounter.   Disposition:  Signed, Satira Sark, MD, [facility] 03/30/2016 10:25 AM    [place] at [place], [place], [place] [address] Phone: [phone]; Fax: [phone]

## 2016-04-27 ENCOUNTER — Other Ambulatory Visit: Payer: Self-pay | Admitting: Physician Assistant

## 2016-04-27 ENCOUNTER — Other Ambulatory Visit: Payer: Self-pay | Admitting: Cardiology

## 2016-04-27 MED FILL — SIMVASTATIN 20 MG TABLET: 20 | 30 days supply | Qty: 30 | Fill #0

## 2016-04-27 MED FILL — metFORMIN HCL 1000 MG TABS: 1000 | 90 days supply | Qty: 180 | Fill #0

## 2016-04-27 MED FILL — METOPROLOL TARTRATE 25 MG T: 25 | 90 days supply | Qty: 180 | Fill #1

## 2016-04-27 MED FILL — CLOPIDOGREL 75 MG TABLET: 75 | 30 days supply | Qty: 30 | Fill #0

## 2016-04-27 MED FILL — ISOSORBIDE MN ER 30 MG TAB: 30 | 90 days supply | Qty: 90 | Fill #1

## 2016-05-15 ENCOUNTER — Ambulatory Visit: Payer: Medicare Other | Admitting: Cardiology

## 2016-05-31 NOTE — Progress Notes (Deleted)
Cardiology Office Note  Date: 05/31/2016   ID: Jacob Little, DOB 1935/07/03, MRN [id number]  PCP: [name], MD  Primary Cardiologist: Rozann Lesches, MD   No chief complaint on file.   History of Present Illness: Jacob Little is a 80 y.o. male last seen in October 2016.   I recommended a Myoview at the last visit for follow-up ischemic testing, although he elected not to proceed.  Past Medical History:  Diagnosis Date  . Coronary artery disease    DES to circumflex 2006  . Essential hypertension   . Hyperlipidemia   . Rheumatic fever   . Type 2 diabetes mellitus ([place])     Past Surgical History:  Procedure Laterality Date  . APPENDECTOMY      Current Outpatient Prescriptions  Medication Sig Dispense Refill  . aspirin 325 MG tablet Take 325 mg by mouth daily.      . clopidogrel (PLAVIX) 75 MG tablet TAKE 1 TABLET BY MOUTH ONCE DAILY 30 tablet 0  . isosorbide mononitrate (IMDUR) 30 MG 24 hr tablet TAKE 1 TABLET BY MOUTH ONCE DAILY 90 tablet 3  . metFORMIN (GLUCOPHAGE) 1000 MG tablet Take 1,000 mg by mouth 2 (two) times daily with a meal.      . metoprolol tartrate (LOPRESSOR) 25 MG tablet TAKE 1 TABLET BY MOUTH TWICE DAILY 180 tablet 3  . simvastatin (ZOCOR) 20 MG tablet TAKE 1 TABLET BY MOUTH ONCE DAILY 30 tablet 0   No current facility-administered medications for this visit.    Allergies:  Patient has no known allergies.   Social History: The patient  reports that he quit smoking about 10 years ago. His smoking use included Cigarettes. He does not have any smokeless tobacco history on file. He reports that he drinks alcohol. He reports that he does not use drugs.   Family History: The patient's family history is not on file.   ROS:  Please see the history of present illness. Otherwise, complete review of systems is positive for {NONE DEFAULTED:18576::"none"}.  All other systems are reviewed and negative.   Physical Exam: VS:  There were no vitals taken  for this visit., BMI There is no height or weight on file to calculate BMI.  Wt Readings from Last 3 Encounters:  03/18/15 161 lb (73 kg)  01/31/14 162 lb (73.5 kg)  02/10/13 164 lb (74.4 kg)    General: Patient appears comfortable at rest. HEENT: Conjunctiva and lids normal, oropharynx clear. Neck: Supple, no elevated JVP or carotid bruits, no thyromegaly. Lungs: Clear to auscultation, nonlabored breathing at rest. Cardiac: Regular rate and rhythm, no S3, soft systolic murmur, no pericardial rub. Abdomen: Soft, nontender, bowel sounds present. Extremities: No pitting edema, distal pulses 2+. Skin: Warm and dry. Musculoskeletal: No kyphosis. Neuropsychiatric: Alert and oriented x3, affect grossly appropriate.  ECG: I personally reviewed the tracing from 03/18/2015 which showed sinus rhythm with RBBB and PAC.  Recent Labwork:  August 2015: Hemoglobin 12.6, platelets 238, potassium 4.7, BUN 11, creatinine 0.7, AST 12, ALT 9, cholesterol 106  Other Studies Reviewed Today:  Echocardiogram 05/20/2011: Study Conclusions  - Left ventricle: The cavity size was normal. There was mild concentric hypertrophy. Systolic function was normal. The estimated ejection fraction was in the range of 55% to 60%. Wall motion was normal; there were no regional wall motion abnormalities. - Aortic valve: Mildly calcified annulus. Trileaflet; mildly thickened, mildly calcified leaflets. Trivial regurgitation. - Mitral valve: Calcified annulus. - Right atrium: The atrium was mildly  dilated. - Atrial septum: No defect or patent foramen ovale was identified. - Pulmonary arteries: PA peak pressure: 14mm Hg (S).  Assessment and Plan:   Current medicines were reviewed with the patient today.  No orders of the defined types were placed in this encounter.   Disposition:  Signed, Satira Sark, MD, [facility] 05/31/2016 7:13 PM    [facility] at [facility] [address], [place], [place] [address] Phone: [phone]; Fax: [phone]

## 2016-06-01 ENCOUNTER — Ambulatory Visit: Payer: Medicare Other | Admitting: Cardiology

## 2016-06-16 DIAGNOSIS — Z79899 Other long term (current) drug therapy: Secondary | ICD-10-CM | POA: Diagnosis not present

## 2016-06-16 DIAGNOSIS — E119 Type 2 diabetes mellitus without complications: Secondary | ICD-10-CM | POA: Diagnosis not present

## 2016-06-16 DIAGNOSIS — I2510 Atherosclerotic heart disease of native coronary artery without angina pectoris: Secondary | ICD-10-CM | POA: Diagnosis not present

## 2016-06-16 DIAGNOSIS — D519 Vitamin B12 deficiency anemia, unspecified: Secondary | ICD-10-CM | POA: Diagnosis not present

## 2016-06-23 ENCOUNTER — Encounter: Payer: Self-pay | Admitting: Cardiology

## 2016-06-23 ENCOUNTER — Ambulatory Visit (INDEPENDENT_AMBULATORY_CARE_PROVIDER_SITE_OTHER): Payer: PPO | Admitting: Cardiology

## 2016-06-23 VITALS — BP 118/60 | HR 80 | Ht 67.0 in | Wt 164.0 lb

## 2016-06-23 DIAGNOSIS — E1159 Type 2 diabetes mellitus with other circulatory complications: Secondary | ICD-10-CM | POA: Diagnosis not present

## 2016-06-23 DIAGNOSIS — D510 Vitamin B12 deficiency anemia due to intrinsic factor deficiency: Secondary | ICD-10-CM | POA: Diagnosis not present

## 2016-06-23 DIAGNOSIS — Z6825 Body mass index (BMI) 25.0-25.9, adult: Secondary | ICD-10-CM | POA: Diagnosis not present

## 2016-06-23 DIAGNOSIS — I10 Essential (primary) hypertension: Secondary | ICD-10-CM | POA: Diagnosis not present

## 2016-06-23 DIAGNOSIS — I2510 Atherosclerotic heart disease of native coronary artery without angina pectoris: Secondary | ICD-10-CM | POA: Diagnosis not present

## 2016-06-23 DIAGNOSIS — E782 Mixed hyperlipidemia: Secondary | ICD-10-CM

## 2016-06-23 DIAGNOSIS — Z23 Encounter for immunization: Secondary | ICD-10-CM | POA: Diagnosis not present

## 2016-06-23 DIAGNOSIS — E119 Type 2 diabetes mellitus without complications: Secondary | ICD-10-CM | POA: Diagnosis not present

## 2016-06-23 NOTE — Patient Instructions (Signed)
Your physician wants you to follow-up in: 1 year with Dr McDowell You will receive a reminder letter in the mail two months in advance. If you don't receive a letter, please call our office to schedule the follow-up appointment.    Your physician recommends that you continue on your current medications as directed. Please refer to the Current Medication list given to you today.     If you need a refill on your cardiac medications before your next appointment, please call your pharmacy.     Thank you for choosing [facility] !        

## 2016-06-23 NOTE — Progress Notes (Signed)
Cardiology Office Note  Date: 06/23/2016   ID: Jacob Little, DOB 1936/02/12, MRN [id number]  PCP: [name], MD  Primary Cardiologist: Rozann Lesches, MD   Chief Complaint  Patient presents with  . Coronary Artery Disease    History of Present Illness: Jacob Little is a 81 y.o. male last seen in October 2016. He presents for a routine follow-up visit. States that he has slowed down somewhat over the years, but is not reporting any angina symptoms or overall major decline. Still tries to plant a garden during the spring and summer.  When I met him at the last visit we did discuss arranging follow-up ischemic evaluation, his most recent cardiac catheterization being in 2011. He elected not to pursue this however. Cardiac catheterization in 2011 demonstrated 50% LAD stenosis, patent stent site within the circumflex associated with 20% in-stent restenosis, 50% obtuse marginal, and 50-60% stenosis within the RCA. Medical therapy was recommended that time.  I reviewed his ECG today which shows sinus rhythm with right bundle branch block and left anterior fascicular block.  We went over his medications. Cardiac regimen is stable and outlined below. He had a recent follow-up visit with lab work per Dr. Willey Blade.  Past Medical History:  Diagnosis Date  . Coronary artery disease    DES to circumflex 2006  . Essential hypertension   . Hyperlipidemia   . Rheumatic fever   . Type 2 diabetes mellitus ([place])     Past Surgical History:  Procedure Laterality Date  . APPENDECTOMY      Current Outpatient Prescriptions  Medication Sig Dispense Refill  . aspirin 325 MG tablet Take 325 mg by mouth daily.      . clopidogrel (PLAVIX) 75 MG tablet TAKE 1 TABLET BY MOUTH ONCE DAILY 30 tablet 0  . isosorbide mononitrate (IMDUR) 30 MG 24 hr tablet TAKE 1 TABLET BY MOUTH ONCE DAILY 90 tablet 3  . metFORMIN (GLUCOPHAGE) 1000 MG tablet Take 1,000 mg by mouth 2 (two) times daily with a meal.      .  metoprolol tartrate (LOPRESSOR) 25 MG tablet TAKE 1 TABLET BY MOUTH TWICE DAILY 180 tablet 3  . simvastatin (ZOCOR) 20 MG tablet TAKE 1 TABLET BY MOUTH ONCE DAILY 30 tablet 0   No current facility-administered medications for this visit.    Allergies:  Patient has no known allergies.   Social History: The patient  reports that he quit smoking about 11 years ago. His smoking use included Cigarettes. He has never used smokeless tobacco. He reports that he drinks alcohol. He reports that he does not use drugs.   ROS:  Please see the history of present illness. Otherwise, complete review of systems is positive for some memory loss.  All other systems are reviewed and negative.   Physical Exam: VS:  BP 118/60   Pulse 80   Ht 5' 7"  (1.702 m)   Wt 164 lb (74.4 kg)   SpO2 95%   BMI 25.69 kg/m , BMI Body mass index is 25.69 kg/m.  Wt Readings from Last 3 Encounters:  06/23/16 164 lb (74.4 kg)  03/18/15 161 lb (73 kg)  01/31/14 162 lb (73.5 kg)    General: Elderly male, appears comfortable at rest. HEENT: Conjunctiva and lids normal, oropharynx clear. Neck: Supple, no elevated JVP or carotid bruits, no thyromegaly. Lungs: Clear to auscultation, nonlabored breathing at rest. Cardiac: Regular rate and rhythm, no S3, soft systolic murmur, no pericardial rub. Abdomen: Soft, nontender, bowel sounds  present. Extremities: No pitting edema, distal pulses 2+. Skin: Warm and dry. Musculoskeletal: No kyphosis. Neuropsychiatric: Alert and oriented x3, affect grossly appropriate.  ECG: I personally reviewed the tracing from 03/18/2015 which showed sinus rhythm with PACs and right bundle branch block.  Recent Labwork:  August 2015: Hemoglobin 12.6, platelets 238, potassium 4.7, BUN 11, creatinine 0.7, AST 12, ALT 9, cholesterol 106  Other Studies Reviewed Today:  Echocardiogram 05/20/2011: Study Conclusions  - Left ventricle: The cavity size was normal. There was mild concentric hypertrophy.  Systolic function was normal. The estimated ejection fraction was in the range of 55% to 60%. Wall motion was normal; there were no regional wall motion abnormalities. - Aortic valve: Mildly calcified annulus. Trileaflet; mildly thickened, mildly calcified leaflets. Trivial regurgitation. - Mitral valve: Calcified annulus. - Right atrium: The atrium was mildly dilated. - Atrial septum: No defect or patent foramen ovale was identified. - Pulmonary arteries: PA peak pressure: 62m Hg (S).  Assessment and Plan:  1. Symptomatically stable CAD status post DES to the circumflex in 2006 with otherwise moderate residual disease that has been managed medically. He continues to prefer conservative follow-up for now without ischemic testing. ECG reviewed.  2. Hyperlipidemia, on Zocor. Requesting most recent lab work from Dr. FWilley Blade  3. Essential hypertension, blood pressure well controlled today. He continues on Lopressor and Imdur.  4. Type 2 diabetes mellitus, on Glucophage. He follows with Dr. FWilley Blade  Current medicines were reviewed with the patient today.   Orders Placed This Encounter  Procedures  . EKG 12-Lead    Disposition: Follow-up in one year, sooner if needed.  Signed, SSatira Sark MD, [facility]1/02/2017 4:13 PM    [facility] [place] [address]Phone: [phone] Fax: [phone]

## 2016-06-24 ENCOUNTER — Other Ambulatory Visit: Payer: Self-pay | Admitting: Physician Assistant

## 2016-06-24 ENCOUNTER — Telehealth: Payer: Self-pay | Admitting: Cardiology

## 2016-06-24 ENCOUNTER — Other Ambulatory Visit: Payer: Self-pay | Admitting: Cardiology

## 2016-06-24 MED ORDER — SIMVASTATIN 20 MG PO TABS
20.0000 mg | ORAL_TABLET | Freq: Every day | ORAL | 3 refills | Status: DC
Start: 2016-06-24 — End: 2017-06-09

## 2016-06-24 MED ORDER — METOPROLOL TARTRATE 25 MG PO TABS: 25.0000 mg | ORAL_TABLET | Freq: Two times a day (BID) | ORAL | 3 refills | Status: DC

## 2016-06-24 MED ORDER — ISOSORBIDE MONONITRATE ER 30 MG PO TB24: 30.0000 mg | ORAL_TABLET | Freq: Every day | ORAL | 3 refills | Status: DC

## 2016-06-24 MED ORDER — CLOPIDOGREL BISULFATE 75 MG PO TABS: 75.0000 mg | ORAL_TABLET | Freq: Every day | ORAL | 3 refills | Status: DC

## 2016-06-24 MED FILL — CLOPIDOGREL 75 MG TABLET: 75 | 90 days supply | Qty: 90 | Fill #0

## 2016-06-24 MED FILL — SIMVASTATIN 20 MG TABLET: 20 | 90 days supply | Qty: 90 | Fill #0

## 2016-06-24 MED FILL — CYANOCOBALAMIN 1,000 MCG/ML: 1000 | 42 days supply | Qty: 6 | Fill #0

## 2016-06-24 NOTE — Telephone Encounter (Signed)
DONE

## 2016-06-24 NOTE — Telephone Encounter (Signed)
Please send refills for 90 days on all patients meds to [facility] / tg

## 2016-07-23 MED FILL — ISOSORBIDE MN ER 30 MG TAB: 30 | 90 days supply | Qty: 90 | Fill #0

## 2016-07-23 MED FILL — metFORMIN HCL 1000 MG TABS: 1000 | 90 days supply | Qty: 180 | Fill #1

## 2016-07-23 MED FILL — METOPROLOL TARTRATE 25 MG T: 25 | 90 days supply | Qty: 180 | Fill #0

## 2016-08-27 MED FILL — CYANOCOBALAMIN 1,000 MCG/ML: 1000 | 180 days supply | Qty: 6 | Fill #1

## 2016-09-14 MED FILL — CLOPIDOGREL 75 MG TABLET: 75 | 90 days supply | Qty: 90 | Fill #1

## 2016-09-14 MED FILL — SIMVASTATIN 20 MG TABLET: 20 | 90 days supply | Qty: 90 | Fill #1

## 2016-10-14 MED FILL — METOPROLOL TARTRATE 25 MG T: 25 | 90 days supply | Qty: 180 | Fill #1

## 2016-10-14 MED FILL — metFORMIN HCL 1000 MG TABS: 1000 | 90 days supply | Qty: 180 | Fill #2

## 2016-10-14 MED FILL — ISOSORBIDE MN ER 30 MG TAB: 30 | 90 days supply | Qty: 90 | Fill #1

## 2016-10-20 DIAGNOSIS — D508 Other iron deficiency anemias: Secondary | ICD-10-CM | POA: Diagnosis not present

## 2016-10-20 DIAGNOSIS — E119 Type 2 diabetes mellitus without complications: Secondary | ICD-10-CM | POA: Diagnosis not present

## 2016-10-20 DIAGNOSIS — Z79899 Other long term (current) drug therapy: Secondary | ICD-10-CM | POA: Diagnosis not present

## 2016-10-29 DIAGNOSIS — I2510 Atherosclerotic heart disease of native coronary artery without angina pectoris: Secondary | ICD-10-CM | POA: Diagnosis not present

## 2016-10-29 DIAGNOSIS — E119 Type 2 diabetes mellitus without complications: Secondary | ICD-10-CM | POA: Diagnosis not present

## 2016-10-29 DIAGNOSIS — E538 Deficiency of other specified B group vitamins: Secondary | ICD-10-CM | POA: Diagnosis not present

## 2016-10-29 MED FILL — NITROGLYCERIN 0.4 MG TAB SL: 0.4 | 8 days supply | Qty: 25 | Fill #0

## 2016-11-23 ENCOUNTER — Encounter (HOSPITAL_COMMUNITY): Payer: Self-pay | Admitting: Emergency Medicine

## 2016-11-23 ENCOUNTER — Emergency Department (HOSPITAL_COMMUNITY): Payer: PPO

## 2016-11-23 ENCOUNTER — Emergency Department (HOSPITAL_COMMUNITY)
Admission: EM | Admit: 2016-11-23 | Discharge: 2016-11-23 | Disposition: A | Discharge status: Home / Self Care | Payer: PPO | Attending: Emergency Medicine | Admitting: Emergency Medicine

## 2016-11-23 ENCOUNTER — Telehealth: Payer: Self-pay

## 2016-11-23 DIAGNOSIS — Z5321 Procedure and treatment not carried out due to patient leaving prior to being seen by health care provider: Secondary | ICD-10-CM | POA: Insufficient documentation

## 2016-11-23 DIAGNOSIS — R079 Chest pain, unspecified: Secondary | ICD-10-CM | POA: Diagnosis not present

## 2016-11-23 LAB — COMPREHENSIVE METABOLIC PANEL
ALBUMIN: 4 g/dL (ref 3.5–5.0)
ALK PHOS: 42 U/L (ref 38–126)
ALT: 13 U/L — ABNORMAL LOW (ref 17–63)
AST: 21 U/L (ref 15–41)
Anion gap: 10 (ref 5–15)
BILIRUBIN TOTAL: 0.8 mg/dL (ref 0.3–1.2)
BUN: 13 mg/dL (ref 6–20)
CO2: 23 mmol/L (ref 22–32)
Calcium: 8.9 mg/dL (ref 8.9–10.3)
Chloride: 100 mmol/L — ABNORMAL LOW (ref 101–111)
Creatinine, Ser: 0.93 mg/dL (ref 0.61–1.24)
GFR calc Af Amer: >60 mL/min (ref >60)
GFR calc non Af Amer: >60 mL/min (ref >60)
GLUCOSE: 296 mg/dL — AB (ref 65–99)
POTASSIUM: 4.2 mmol/L (ref 3.5–5.1)
Sodium: 133 mmol/L — ABNORMAL LOW (ref 135–145)
TOTAL PROTEIN: 6.9 g/dL (ref 6.5–8.1)

## 2016-11-23 LAB — CBC
HEMATOCRIT: 37.1 % — AB (ref 39.0–52.0)
HEMOGLOBIN: 12.7 g/dL — AB (ref 13.0–17.0)
MCH: 30.7 pg (ref 26.0–34.0)
MCHC: 34.2 g/dL (ref 30.0–36.0)
MCV: 89.6 fL (ref 78.0–100.0)
Platelets: 270 10*3/uL (ref 150–400)
RBC: 4.14 MIL/uL — ABNORMAL LOW (ref 4.22–5.81)
RDW: 12.6 % (ref 11.5–15.5)
WBC: 8.1 10*3/uL (ref 4.0–10.5)

## 2016-11-23 LAB — TROPONIN I: Troponin I: <0.03 ng/mL (ref <0.03)

## 2016-11-23 NOTE — Telephone Encounter (Signed)
Patient walked into cardiology office, I took him via [facility] to the ED, notified 911 dispatch where patient is

## 2016-11-23 NOTE — Telephone Encounter (Signed)
Per Reece Levy front desk, Patient left message on office voicemail that he had chest pain and arm pain.I was notified of message and attempted to reach patient but got no answer.I called contact number [phone] and spoke with son in law [place].He stated patient had chest pain over the weekend but had swallowed NTG.Myrle Sheng is a few minutes away and is going to check on patient and call me back.     I called 911 and they are in route now

## 2016-11-23 NOTE — ED Notes (Signed)
PT left after triage and drove himself home in a pickup truck. Son in law was made aware via phone that pt was in the ED.

## 2016-11-23 NOTE — ED Triage Notes (Signed)
PT states intermittent left sided chest pain x2 days. PT denies any SOB or chest pain at this time. PT states he drove himself to the ED today and outpatient cardiology brought him to the ED for evaluation bc he didn't have an appt today.

## 2016-12-12 MED FILL — SIMVASTATIN 20 MG TABLET: 20 | 90 days supply | Qty: 90 | Fill #2

## 2016-12-12 MED FILL — CLOPIDOGREL 75 MG TABLET: 75 | 90 days supply | Qty: 90 | Fill #2

## 2016-12-24 ENCOUNTER — Ambulatory Visit (INDEPENDENT_AMBULATORY_CARE_PROVIDER_SITE_OTHER): Payer: PPO | Admitting: Cardiology

## 2016-12-24 ENCOUNTER — Encounter: Payer: Self-pay | Admitting: Cardiology

## 2016-12-24 VITALS — BP 122/80 | HR 83 | Ht 70.0 in | Wt 164.0 lb

## 2016-12-24 DIAGNOSIS — I10 Essential (primary) hypertension: Secondary | ICD-10-CM

## 2016-12-24 DIAGNOSIS — I25119 Atherosclerotic heart disease of native coronary artery with unspecified angina pectoris: Secondary | ICD-10-CM | POA: Diagnosis not present

## 2016-12-24 DIAGNOSIS — E782 Mixed hyperlipidemia: Secondary | ICD-10-CM | POA: Diagnosis not present

## 2016-12-24 DIAGNOSIS — E1159 Type 2 diabetes mellitus with other circulatory complications: Secondary | ICD-10-CM | POA: Diagnosis not present

## 2016-12-24 MED ORDER — ISOSORBIDE MONONITRATE ER 30 MG PO TB24: 30.0000 mg | ORAL_TABLET | Freq: Two times a day (BID) | ORAL | 3 refills | Status: DC

## 2016-12-24 NOTE — Patient Instructions (Signed)
Your physician wants you to follow-up in: 6 months with Dr Ferne Reus will receive a reminder letter in the mail two months in advance. If you don't receive a letter, please call our office to schedule the follow-up appointment.    INCREASE Imdur to 30 mg twice a day     No lab tests or blood work ordered today.    Thank you for choosing [facility] !

## 2016-12-24 NOTE — Progress Notes (Signed)
Cardiology Office Note  Date: 12/24/2016   ID: Jacob Little, DOB 06/23/1935, MRN [id number]  PCP: [name], MD  Primary Cardiologist: Rozann Lesches, MD   Chief Complaint  Patient presents with  . Coronary Artery Disease    History of Present Illness: Jacob Little is a 81 y.o. male last seen in January. I reviewed interval records, he apparently walked into our office back in June without a scheduled visit, reporting chest pain symptoms and was taken to the ER. He does not look like he stayed for evaluation beyond triage. He did have some lab work drawn which showed a troponin I level less than 0.03. ECG also obtained as detailed below.  He presents today for a routine visit. He states that he has had some episodes of chest pain, describing the one above and a few that occurred prior to that. He does have nitroglycerin available. He states that he has been compliant with his medications which are outlined below. I have talked with him about follow-up stress testing, however he has not wanted to pursue this. We did discuss trying to increase his Imdur further for angina control.  Cardiac catheterization in 2011 demonstrated 50% LAD stenosis, patent stent site within the circumflex associated with 20% in-stent restenosis, 50% obtuse marginal, and 50-60% stenosis within the RCA. Medical therapy was recommended that time.  Past Medical History:  Diagnosis Date  . Coronary artery disease    DES to circumflex 2006  . Essential hypertension   . Hyperlipidemia   . Rheumatic fever   . Type 2 diabetes mellitus ([place])     Past Surgical History:  Procedure Laterality Date  . APPENDECTOMY    . CORONARY ANGIOPLASTY WITH STENT PLACEMENT      Current Outpatient Prescriptions  Medication Sig Dispense Refill  . aspirin 325 MG tablet Take 325 mg by mouth daily.      . clopidogrel (PLAVIX) 75 MG tablet Take 1 tablet (75 mg total) by mouth daily. 90 tablet 3  . isosorbide mononitrate  (IMDUR) 30 MG 24 hr tablet Take 1 tablet (30 mg total) by mouth daily. 90 tablet 3  . metFORMIN (GLUCOPHAGE) 1000 MG tablet Take 1,000 mg by mouth 2 (two) times daily with a meal.      . metoprolol tartrate (LOPRESSOR) 25 MG tablet Take 1 tablet (25 mg total) by mouth 2 (two) times daily. 180 tablet 3  . simvastatin (ZOCOR) 20 MG tablet Take 1 tablet (20 mg total) by mouth daily. 90 tablet 3   No current facility-administered medications for this visit.    Allergies:  Patient has no known allergies.   Social History: The patient  reports that he quit smoking about 11 years ago. His smoking use included Cigarettes. He has never used smokeless tobacco. He reports that he does not drink alcohol or use drugs.   ROS:  Please see the history of present illness. Otherwise, complete review of systems is positive for hearing loss.  All other systems are reviewed and negative.   Physical Exam: VS:  BP 122/80   Pulse 83   Ht 5\' 10"  (1.778 m)   Wt 164 lb (74.4 kg)   SpO2 97%   BMI 23.53 kg/m , BMI Body mass index is 23.53 kg/m.  Wt Readings from Last 3 Encounters:  12/24/16 164 lb (74.4 kg)  06/23/16 164 lb (74.4 kg)  03/18/15 161 lb (73 kg)    General: Elderly male, appears comfortable at rest. HEENT: Conjunctiva  and lids normal, oropharynx clear. Neck: Supple, no elevated JVP or carotid bruits, no thyromegaly. Lungs: Clear to auscultation, nonlabored breathing at rest. Cardiac: Regular rate and rhythm, no S3, soft systolic murmur, no pericardial rub. Abdomen: Soft, nontender, bowel sounds present. Extremities: No pitting edema, distal pulses 2+. Skin: Warm and dry. Musculoskeletal: No kyphosis. Neuropsychiatric: Alert and oriented x3, affect grossly appropriate.  ECG: I personally reviewed the tracing from 11/23/2016 which showed sinus arrhythmia with left anterior fascicular block and right bundle-branch block.  Recent Labwork: 11/23/2016: ALT 13; AST 21; BUN 13; Creatinine, Ser 0.93;  Hemoglobin 12.7; Platelets 270; Potassium 4.2; Sodium 133   Other Studies Reviewed Today:  Echocardiogram 05/20/2011: Study Conclusions  - Left ventricle: The cavity size was normal. There was mild concentric hypertrophy. Systolic function was normal. The estimated ejection fraction was in the range of 55% to 60%. Wall motion was normal; there were no regional wall motion abnormalities. - Aortic valve: Mildly calcified annulus. Trileaflet; mildly thickened, mildly calcified leaflets. Trivial regurgitation. - Mitral valve: Calcified annulus. - Right atrium: The atrium was mildly dilated. - Atrial septum: No defect or patent foramen ovale was identified. - Pulmonary arteries: PA peak pressure: 69mm Hg (S).  Assessment and Plan:  1. CAD status post DES to the circumflex in 2006 with moderate residual disease that was managed medically as of 2011. He continues to prefer medical therapy without follow-up ischemic testing. He does report some recurrent chest pain symptoms suggestive of angina and I have recommended that we try and increase his Imdur to 30 mg twice daily.  2. Hyperlipidemia on Zocor. He continues to follow regularly with Dr. Willey Blade.  3. Essential hypertension, blood pressure is well controlled today.  4. Type 2 diabetes mellitus, remains on Glucophage with follow-up per Dr. Willey Blade.  Current medicines were reviewed with the patient today.  Disposition: Follow-up in 6 months, sooner if needed.  Signed, Satira Sark, MD, [facility] 12/24/2016 1:42 PM    [facility] [address] [place], [place] [address] Phone: [phone]; Fax: [phone]

## 2016-12-30 MED FILL — ISOSORBIDE MN ER 30 MG TAB: 30 | 90 days supply | Qty: 180 | Fill #0

## 2017-01-07 MED FILL — METOPROLOL TARTRATE 25 MG T: 25 | 90 days supply | Qty: 180 | Fill #2

## 2017-01-07 MED FILL — metFORMIN HCL 1000 MG TABS: 1000 | 90 days supply | Qty: 180 | Fill #3

## 2017-02-11 MED FILL — CYANOCOBALAMIN 1,000 MCG/ML: 1000 | 180 days supply | Qty: 6 | Fill #2

## 2017-02-19 DIAGNOSIS — I2510 Atherosclerotic heart disease of native coronary artery without angina pectoris: Secondary | ICD-10-CM | POA: Diagnosis not present

## 2017-02-19 DIAGNOSIS — Z79899 Other long term (current) drug therapy: Secondary | ICD-10-CM | POA: Diagnosis not present

## 2017-02-19 DIAGNOSIS — E119 Type 2 diabetes mellitus without complications: Secondary | ICD-10-CM | POA: Diagnosis not present

## 2017-02-19 DIAGNOSIS — D528 Other folate deficiency anemias: Secondary | ICD-10-CM | POA: Diagnosis not present

## 2017-02-25 DIAGNOSIS — E785 Hyperlipidemia, unspecified: Secondary | ICD-10-CM | POA: Diagnosis not present

## 2017-02-25 DIAGNOSIS — E119 Type 2 diabetes mellitus without complications: Secondary | ICD-10-CM | POA: Diagnosis not present

## 2017-02-25 DIAGNOSIS — Z23 Encounter for immunization: Secondary | ICD-10-CM | POA: Diagnosis not present

## 2017-02-25 DIAGNOSIS — I2510 Atherosclerotic heart disease of native coronary artery without angina pectoris: Secondary | ICD-10-CM | POA: Diagnosis not present

## 2017-03-07 MED FILL — SIMVASTATIN 20 MG TABLET: 20 | 90 days supply | Qty: 90 | Fill #3

## 2017-03-08 MED FILL — CLOPIDOGREL 75 MG TABLET: 75 | 90 days supply | Qty: 90 | Fill #3

## 2017-04-05 MED FILL — METOPROLOL TARTRATE 25 MG T: 25 | 90 days supply | Qty: 180 | Fill #3

## 2017-04-05 MED FILL — metFORMIN HCL 1000 MG TABS: 1000 | 90 days supply | Qty: 180 | Fill #4

## 2017-04-06 MED FILL — ISOSORBIDE MN ER 30 MG TAB: 30 | 90 days supply | Qty: 180 | Fill #1

## 2017-06-09 ENCOUNTER — Other Ambulatory Visit: Payer: Self-pay | Admitting: Cardiology

## 2017-06-09 MED FILL — CLOPIDOGREL 75 MG TABLET: 75 | 90 days supply | Qty: 90 | Fill #0

## 2017-06-09 MED FILL — SIMVASTATIN 20 MG TABLET: 20 | 90 days supply | Qty: 90 | Fill #0

## 2017-06-25 DIAGNOSIS — E119 Type 2 diabetes mellitus without complications: Secondary | ICD-10-CM | POA: Diagnosis not present

## 2017-07-01 DIAGNOSIS — E119 Type 2 diabetes mellitus without complications: Secondary | ICD-10-CM | POA: Diagnosis not present

## 2017-07-01 DIAGNOSIS — I2510 Atherosclerotic heart disease of native coronary artery without angina pectoris: Secondary | ICD-10-CM | POA: Diagnosis not present

## 2017-07-04 MED FILL — ISOSORBIDE MN ER 30 MG TAB: 30 | 90 days supply | Qty: 180 | Fill #2

## 2017-07-05 MED FILL — METOPROLOL TARTRATE 25 MG T: 25 | 90 days supply | Qty: 180 | Fill #0

## 2017-07-05 MED FILL — metFORMIN HCL 1000 MG TABS: 1000 | 90 days supply | Qty: 180 | Fill #0

## 2017-07-21 ENCOUNTER — Encounter: Payer: Self-pay | Admitting: Cardiology

## 2017-07-21 ENCOUNTER — Ambulatory Visit: Payer: PPO | Admitting: Cardiology

## 2017-07-21 ENCOUNTER — Other Ambulatory Visit: Payer: Self-pay

## 2017-07-21 VITALS — BP 138/60 | HR 75 | Ht 70.0 in | Wt 163.4 lb

## 2017-07-21 DIAGNOSIS — E782 Mixed hyperlipidemia: Secondary | ICD-10-CM | POA: Diagnosis not present

## 2017-07-21 DIAGNOSIS — E1165 Type 2 diabetes mellitus with hyperglycemia: Secondary | ICD-10-CM | POA: Diagnosis not present

## 2017-07-21 DIAGNOSIS — I10 Essential (primary) hypertension: Secondary | ICD-10-CM | POA: Diagnosis not present

## 2017-07-21 DIAGNOSIS — I25119 Atherosclerotic heart disease of native coronary artery with unspecified angina pectoris: Secondary | ICD-10-CM

## 2017-07-21 NOTE — Patient Instructions (Signed)

## 2017-07-21 NOTE — Progress Notes (Signed)
Cardiology Office Note  Date: 07/21/2017   ID: [name], DOB [birth year], MRN [id number]  PCP: [name], MD  Primary Cardiologist: Rozann Lesches, MD   Chief Complaint  Patient presents with  . Coronary Artery Disease    History of Present Illness: Jacob Little is a 82 y.o. male last seen in July 2018.  He presents today for a routine follow-up visit.  Reports no significant chest pain since last encounter.  He is fairly sedentary, does his basic ADLs.  Still lives in his own home.  Cardiac catheterization in 2011 demonstrated 50% LAD stenosis, patent stent site within the circumflex associated with 20% in-stent restenosis, 50% obtuse marginal, and 50-60% stenosis within the RCA. Medical therapy was recommended that time.  We went over his medications.  Cardiac regimen includes aspirin, Plavix, Imdur, Lopressor, and Zocor.  I reviewed his recent lab work, LDL was 46.  Past Medical History:  Diagnosis Date  . Coronary artery disease    DES to circumflex 2006  . Essential hypertension   . Hyperlipidemia   . Rheumatic fever   . Type 2 diabetes mellitus ([place])     Past Surgical History:  Procedure Laterality Date  . APPENDECTOMY    . CORONARY ANGIOPLASTY WITH STENT PLACEMENT      Current Outpatient Medications  Medication Sig Dispense Refill  . aspirin 325 MG tablet Take 325 mg by mouth daily.      . clopidogrel (PLAVIX) 75 MG tablet TAKE 1 TABLET BY MOUTH DAILY. 90 tablet 3  . isosorbide mononitrate (IMDUR) 30 MG 24 hr tablet Take 1 tablet (30 mg total) by mouth 2 (two) times daily. 180 tablet 3  . metFORMIN (GLUCOPHAGE) 1000 MG tablet Take 1,000 mg by mouth 2 (two) times daily with a meal.      . metoprolol tartrate (LOPRESSOR) 25 MG tablet TAKE 1 TABLET BY MOUTH 2 TIMES DAILY. 180 tablet 3  . simvastatin (ZOCOR) 20 MG tablet TAKE 1 TABLET BY MOUTH DAILY. 90 tablet 3   No current facility-administered medications for this visit.    Allergies:  Patient has no  known allergies.   Social History: The patient  reports that he quit smoking about 12 years ago. His smoking use included cigarettes. he has never used smokeless tobacco. He reports that he does not drink alcohol or use drugs.   ROS:  Please see the history of present illness. Otherwise, complete review of systems is positive for hearing loss, arthritic stiffness.  All other systems are reviewed and negative.   Physical Exam: VS:  BP 138/60   Pulse 75   Ht 5\' 10"  (1.778 m)   Wt 163 lb 6.4 oz (74.1 kg)   SpO2 95% Comment: on room air  BMI 23.45 kg/m , BMI Body mass index is 23.45 kg/m.  Wt Readings from Last 3 Encounters:  07/21/17 163 lb 6.4 oz (74.1 kg)  12/24/16 164 lb (74.4 kg)  06/23/16 164 lb (74.4 kg)    General: Elderly male, no distress. HEENT: Conjunctiva and lids normal, oropharynx clear. Neck: Supple, no elevated JVP or carotid bruits, no thyromegaly. Lungs: Clear to auscultation, nonlabored breathing at rest. Cardiac: Regular rate and rhythm, no S3, soft systolic murmur. Abdomen: Soft, nontender, bowel sounds present. Extremities: No pitting edema, distal pulses 2+. Skin: Warm and dry. Musculoskeletal: No kyphosis. Neuropsychiatric: Alert and oriented x3, affect grossly appropriate.  ECG: I personally reviewed the tracing from 11/23/2016 which showed sinus rhythm with right bundle branch  block and left anterior fascicular block.  Recent Labwork: 11/23/2016: ALT 13; AST 21; BUN 13; Creatinine, Ser 0.93; Hemoglobin 12.7; Platelets 270; Potassium 4.2; Sodium 133  September 2018: BUN 10, creatinine 0.89, potassium 4.3, AST 10, ALT 8, hemoglobin 12.6, platelets 273, cholesterol 112, triglycerides 123, HDL 41, LDL 46, hemoglobin A1c 8.2  Other Studies Reviewed Today:  Echocardiogram 05/20/2011: Study Conclusions  - Left ventricle: The cavity size was normal. There was mild concentric hypertrophy. Systolic function was normal. The estimated ejection fraction was in  the range of 55% to 60%. Wall motion was normal; there were no regional wall motion abnormalities. - Aortic valve: Mildly calcified annulus. Trileaflet; mildly thickened, mildly calcified leaflets. Trivial regurgitation. - Mitral valve: Calcified annulus. - Right atrium: The atrium was mildly dilated. - Atrial septum: No defect or patent foramen ovale was identified. - Pulmonary arteries: PA peak pressure: 24mm Hg (S).  Assessment and Plan:  1.  CAD status post DES to the circumflex in 2006 with moderate residual disease that was managed medically as of 2011.  He prefers to hold off on follow-up ischemic testing.  He has not had any progressive angina on present regimen.  2.  Mixed hyperlipidemia, continues on Zocor.  Recent LDL 46.  3.  Essential hypertension, no changes made to present regimen.  Keep follow-up with Dr. Willey Blade.  4.  Type 2 diabetes mellitus, recent hemoglobin A1c 8.2.  He follows with Dr. Willey Blade on [name].  Current medicines were reviewed with the patient today.  Disposition: Follow-up in 6 months.  Signed, Satira Sark, MD, [facility] 07/21/2017 3:20 PM    [facility] at [facility] [address] [place], [place] [address] Phone: [phone]; Fax: [phone]

## 2017-08-27 MED FILL — CLOPIDOGREL 75 MG TABLET: 75 | 90 days supply | Qty: 90 | Fill #1

## 2017-08-27 MED FILL — SIMVASTATIN 20 MG TABS: 20 | 90 days supply | Qty: 90 | Fill #1

## 2017-09-06 DIAGNOSIS — Z23 Encounter for immunization: Secondary | ICD-10-CM | POA: Diagnosis not present

## 2017-09-14 MED FILL — CYANOCOBALAMIN 1,000 MCG/ML: 1000 | 168 days supply | Qty: 6 | Fill #0 | Status: TO

## 2017-09-23 MED FILL — METOPROLOL TARTRATE 25 MG T: 25 | 90 days supply | Qty: 180 | Fill #1

## 2017-09-23 MED FILL — ISOSORBIDE MN ER 30 MG TAB: 30 | 90 days supply | Qty: 180 | Fill #3

## 2017-09-23 MED FILL — metFORMIN HCL 1000 MG TABS: 1000 | 90 days supply | Qty: 180 | Fill #1

## 2017-10-28 DIAGNOSIS — E119 Type 2 diabetes mellitus without complications: Secondary | ICD-10-CM | POA: Diagnosis not present

## 2017-11-05 DIAGNOSIS — I209 Angina pectoris, unspecified: Secondary | ICD-10-CM | POA: Diagnosis not present

## 2017-11-05 DIAGNOSIS — D510 Vitamin B12 deficiency anemia due to intrinsic factor deficiency: Secondary | ICD-10-CM | POA: Diagnosis not present

## 2017-11-05 DIAGNOSIS — G3184 Mild cognitive impairment, so stated: Secondary | ICD-10-CM | POA: Diagnosis not present

## 2017-11-05 DIAGNOSIS — E119 Type 2 diabetes mellitus without complications: Secondary | ICD-10-CM | POA: Diagnosis not present

## 2017-11-05 DIAGNOSIS — R413 Other amnesia: Secondary | ICD-10-CM | POA: Diagnosis not present

## 2017-11-05 MED FILL — OZEMPIC 0.25 OR 0.5 MG/DOSE: 2 | 56 days supply | Qty: 2 | Fill #0

## 2017-11-08 ENCOUNTER — Emergency Department (HOSPITAL_COMMUNITY): Payer: PPO

## 2017-11-08 ENCOUNTER — Inpatient Hospital Stay (HOSPITAL_COMMUNITY)
Admission: EM | Admit: 2017-11-08 | Discharge: 2017-11-12 | DRG: 065 | Disposition: A | Discharge status: Skilled Nursing Facility | Payer: PPO | Attending: Internal Medicine | Admitting: Internal Medicine

## 2017-11-08 ENCOUNTER — Other Ambulatory Visit: Payer: Self-pay

## 2017-11-08 ENCOUNTER — Encounter (HOSPITAL_COMMUNITY): Payer: Self-pay | Admitting: Emergency Medicine

## 2017-11-08 DIAGNOSIS — R29703 NIHSS score 3: Secondary | ICD-10-CM | POA: Diagnosis present

## 2017-11-08 DIAGNOSIS — Z7984 Long term (current) use of oral hypoglycemic drugs: Secondary | ICD-10-CM

## 2017-11-08 DIAGNOSIS — E785 Hyperlipidemia, unspecified: Secondary | ICD-10-CM | POA: Diagnosis present

## 2017-11-08 DIAGNOSIS — I63512 Cerebral infarction due to unspecified occlusion or stenosis of left middle cerebral artery: Secondary | ICD-10-CM | POA: Diagnosis present

## 2017-11-08 DIAGNOSIS — E1165 Type 2 diabetes mellitus with hyperglycemia: Secondary | ICD-10-CM | POA: Diagnosis present

## 2017-11-08 DIAGNOSIS — I639 Cerebral infarction, unspecified: Secondary | ICD-10-CM | POA: Diagnosis not present

## 2017-11-08 DIAGNOSIS — Z7982 Long term (current) use of aspirin: Secondary | ICD-10-CM

## 2017-11-08 DIAGNOSIS — G9340 Encephalopathy, unspecified: Secondary | ICD-10-CM | POA: Diagnosis present

## 2017-11-08 DIAGNOSIS — R2681 Unsteadiness on feet: Secondary | ICD-10-CM | POA: Diagnosis not present

## 2017-11-08 DIAGNOSIS — R2689 Other abnormalities of gait and mobility: Secondary | ICD-10-CM | POA: Diagnosis not present

## 2017-11-08 DIAGNOSIS — Z7902 Long term (current) use of antithrombotics/antiplatelets: Secondary | ICD-10-CM | POA: Diagnosis not present

## 2017-11-08 DIAGNOSIS — F0151 Vascular dementia with behavioral disturbance: Secondary | ICD-10-CM | POA: Diagnosis present

## 2017-11-08 DIAGNOSIS — R4182 Altered mental status, unspecified: Secondary | ICD-10-CM

## 2017-11-08 DIAGNOSIS — Z955 Presence of coronary angioplasty implant and graft: Secondary | ICD-10-CM

## 2017-11-08 DIAGNOSIS — R269 Unspecified abnormalities of gait and mobility: Secondary | ICD-10-CM | POA: Diagnosis not present

## 2017-11-08 DIAGNOSIS — R1312 Dysphagia, oropharyngeal phase: Secondary | ICD-10-CM | POA: Diagnosis not present

## 2017-11-08 DIAGNOSIS — E0865 Diabetes mellitus due to underlying condition with hyperglycemia: Secondary | ICD-10-CM | POA: Diagnosis not present

## 2017-11-08 DIAGNOSIS — I63412 Cerebral infarction due to embolism of left middle cerebral artery: Principal | ICD-10-CM | POA: Diagnosis present

## 2017-11-08 DIAGNOSIS — R569 Unspecified convulsions: Secondary | ICD-10-CM | POA: Diagnosis not present

## 2017-11-08 DIAGNOSIS — I6523 Occlusion and stenosis of bilateral carotid arteries: Secondary | ICD-10-CM | POA: Diagnosis not present

## 2017-11-08 DIAGNOSIS — I2510 Atherosclerotic heart disease of native coronary artery without angina pectoris: Secondary | ICD-10-CM | POA: Diagnosis present

## 2017-11-08 DIAGNOSIS — Z87891 Personal history of nicotine dependence: Secondary | ICD-10-CM | POA: Diagnosis not present

## 2017-11-08 DIAGNOSIS — J449 Chronic obstructive pulmonary disease, unspecified: Secondary | ICD-10-CM | POA: Diagnosis present

## 2017-11-08 DIAGNOSIS — R41841 Cognitive communication deficit: Secondary | ICD-10-CM | POA: Diagnosis not present

## 2017-11-08 DIAGNOSIS — R410 Disorientation, unspecified: Secondary | ICD-10-CM | POA: Diagnosis not present

## 2017-11-08 DIAGNOSIS — R531 Weakness: Secondary | ICD-10-CM | POA: Diagnosis not present

## 2017-11-08 DIAGNOSIS — F01518 Vascular dementia, unspecified severity, with other behavioral disturbance: Secondary | ICD-10-CM

## 2017-11-08 DIAGNOSIS — J439 Emphysema, unspecified: Secondary | ICD-10-CM | POA: Diagnosis not present

## 2017-11-08 DIAGNOSIS — I10 Essential (primary) hypertension: Secondary | ICD-10-CM | POA: Diagnosis present

## 2017-11-08 DIAGNOSIS — I25119 Atherosclerotic heart disease of native coronary artery with unspecified angina pectoris: Secondary | ICD-10-CM | POA: Diagnosis not present

## 2017-11-08 DIAGNOSIS — M6281 Muscle weakness (generalized): Secondary | ICD-10-CM | POA: Diagnosis not present

## 2017-11-08 LAB — URINALYSIS, ROUTINE W REFLEX MICROSCOPIC
Bacteria, UA: NONE SEEN
Bilirubin Urine: NEGATIVE
Glucose, UA: >=500 mg/dL — AB
Hgb urine dipstick: NEGATIVE
KETONES UR: 5 mg/dL — AB
Leukocytes, UA: NEGATIVE
Nitrite: NEGATIVE
PH: 5 (ref 5.0–8.0)
Protein, ur: NEGATIVE mg/dL
SPECIFIC GRAVITY, URINE: 1.021 (ref 1.005–1.030)

## 2017-11-08 LAB — CBC
HEMATOCRIT: 39.9 % (ref 39.0–52.0)
Hemoglobin: 13.5 g/dL (ref 13.0–17.0)
MCH: 29.9 pg (ref 26.0–34.0)
MCHC: 33.8 g/dL (ref 30.0–36.0)
MCV: 88.3 fL (ref 78.0–100.0)
PLATELETS: 275 10*3/uL (ref 150–400)
RBC: 4.52 MIL/uL (ref 4.22–5.81)
RDW: 12.5 % (ref 11.5–15.5)
WBC: 9.8 10*3/uL (ref 4.0–10.5)

## 2017-11-08 LAB — COMPREHENSIVE METABOLIC PANEL
ALBUMIN: 3.9 g/dL (ref 3.5–5.0)
ALK PHOS: 48 U/L (ref 38–126)
ALT: 15 U/L — AB (ref 17–63)
AST: 17 U/L (ref 15–41)
Anion gap: 10 (ref 5–15)
BUN: 14 mg/dL (ref 6–20)
CO2: 23 mmol/L (ref 22–32)
CREATININE: 1.08 mg/dL (ref 0.61–1.24)
Calcium: 9.4 mg/dL (ref 8.9–10.3)
Chloride: 102 mmol/L (ref 101–111)
GFR calc Af Amer: >60 mL/min (ref >60)
GFR calc non Af Amer: >60 mL/min (ref >60)
GLUCOSE: 309 mg/dL — AB (ref 65–99)
Potassium: 4.5 mmol/L (ref 3.5–5.1)
Sodium: 135 mmol/L (ref 135–145)
Total Bilirubin: 1.1 mg/dL (ref 0.3–1.2)
Total Protein: 6.9 g/dL (ref 6.5–8.1)

## 2017-11-08 LAB — DIFFERENTIAL
Basophils Absolute: 0 10*3/uL (ref 0.0–0.1)
Basophils Relative: 0 %
Eosinophils Absolute: 0.1 10*3/uL (ref 0.0–0.7)
Eosinophils Relative: 1 %
LYMPHS ABS: 1.6 10*3/uL (ref 0.7–4.0)
LYMPHS PCT: 17 %
MONOS PCT: 6 %
Monocytes Absolute: 0.6 10*3/uL (ref 0.1–1.0)
Neutro Abs: 7.2 10*3/uL (ref 1.7–7.7)
Neutrophils Relative %: 76 %

## 2017-11-08 LAB — PROTIME-INR
INR: 1.04
Prothrombin Time: 13.5 seconds (ref 11.4–15.2)

## 2017-11-08 LAB — GLUCOSE, CAPILLARY
GLUCOSE-CAPILLARY: 99 mg/dL (ref 65–99)
Glucose-Capillary: 102 mg/dL — ABNORMAL HIGH (ref 65–99)

## 2017-11-08 LAB — TROPONIN I: Troponin I: <0.03 ng/mL (ref <0.03)

## 2017-11-08 LAB — APTT: aPTT: 24 seconds (ref 24–36)

## 2017-11-08 LAB — RAPID URINE DRUG SCREEN, HOSP PERFORMED
AMPHETAMINES: NOT DETECTED
BARBITURATES: NOT DETECTED
BENZODIAZEPINES: NOT DETECTED
COCAINE: NOT DETECTED
Opiates: NOT DETECTED
TETRAHYDROCANNABINOL: NOT DETECTED

## 2017-11-08 LAB — CBG MONITORING, ED: Glucose-Capillary: 303 mg/dL — ABNORMAL HIGH (ref 65–99)

## 2017-11-08 LAB — ETHANOL

## 2017-11-08 MED ORDER — ACETAMINOPHEN 160 MG/5ML PO SOLN: 650.0000 mg | ORAL | Status: DC | PRN

## 2017-11-08 MED ORDER — ACETAMINOPHEN 650 MG RE SUPP: 650.0000 mg | RECTAL | Status: DC | PRN

## 2017-11-08 MED ORDER — SENNOSIDES-DOCUSATE SODIUM 8.6-50 MG PO TABS: 1.0000 | ORAL_TABLET | Freq: Every evening | ORAL | Status: DC | PRN

## 2017-11-08 MED ORDER — ENOXAPARIN SODIUM 40 MG/0.4ML ~~LOC~~ SOLN
40.0000 mg | SUBCUTANEOUS | Status: DC
  Administered 2017-11-08 – 2017-11-11 (×4): 40 mg via SUBCUTANEOUS
  Filled 2017-11-08 (×4): qty 0.4

## 2017-11-08 MED ORDER — STROKE: EARLY STAGES OF RECOVERY BOOK
Freq: Once | Status: AC
  Administered 2017-11-08: 17:00:00
  Filled 2017-11-08: qty 1

## 2017-11-08 MED ORDER — ASPIRIN 300 MG RE SUPP
300.0000 mg | Freq: Every day | RECTAL | Status: DC
  Administered 2017-11-09: 300 mg via RECTAL
  Filled 2017-11-08 (×2): qty 1

## 2017-11-08 MED ORDER — ACETAMINOPHEN 325 MG PO TABS
650.0000 mg | ORAL_TABLET | ORAL | Status: DC | PRN
Start: 2017-11-08 — End: 2017-11-12
  Filled 2017-11-08: qty 2

## 2017-11-08 MED ORDER — SODIUM CHLORIDE 0.9 % IV SOLN
INTRAVENOUS | Status: DC
  Administered 2017-11-08 – 2017-11-10 (×4): via INTRAVENOUS

## 2017-11-08 MED ORDER — ASPIRIN 325 MG PO TABS
325.0000 mg | ORAL_TABLET | Freq: Every day | ORAL | Status: DC
  Administered 2017-11-10 – 2017-11-12 (×3): 325 mg via ORAL
  Filled 2017-11-08 (×3): qty 1

## 2017-11-08 MED ORDER — SODIUM CHLORIDE 0.9 % IV SOLN
INTRAVENOUS | Status: DC
  Administered 2017-11-08: 20:00:00 via INTRAVENOUS

## 2017-11-08 MED ORDER — INSULIN ASPART 100 UNIT/ML ~~LOC~~ SOLN
0.0000 [IU] | SUBCUTANEOUS | Status: DC
  Administered 2017-11-09 (×2): 1 [IU] via SUBCUTANEOUS
  Administered 2017-11-09: 3 [IU] via SUBCUTANEOUS
  Administered 2017-11-09: 1 [IU] via SUBCUTANEOUS
  Administered 2017-11-10: 2 [IU] via SUBCUTANEOUS
  Administered 2017-11-10 (×4): 1 [IU] via SUBCUTANEOUS
  Administered 2017-11-11: 3 [IU] via SUBCUTANEOUS
  Administered 2017-11-11: 1 [IU] via SUBCUTANEOUS
  Administered 2017-11-12: 2 [IU] via SUBCUTANEOUS
  Administered 2017-11-12: 1 [IU] via SUBCUTANEOUS

## 2017-11-08 NOTE — ED Notes (Signed)
Pt attempting urine sample 

## 2017-11-08 NOTE — ED Notes (Signed)
Pt with trouble swallowing water via straw and began to cough.
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
## 2017-11-08 NOTE — ED Notes (Signed)
Pt admits to daughter in room just minutes ago that pt had a fall about a week ago and landed on back and hit head on cement, pt is a poor historian and unable to state clearly what happened, pt keeps repeating that he has trouble to remembering and can't thing clearly.  Pt on plavix.
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
## 2017-11-09 ENCOUNTER — Observation Stay (HOSPITAL_COMMUNITY): Payer: PPO

## 2017-11-09 ENCOUNTER — Inpatient Hospital Stay (HOSPITAL_COMMUNITY)
Admit: 2017-11-09 | Discharge: 2017-11-09 | Disposition: A | Discharge status: Home / Self Care | Payer: PPO | Attending: Neurology | Admitting: Neurology

## 2017-11-09 DIAGNOSIS — I25119 Atherosclerotic heart disease of native coronary artery with unspecified angina pectoris: Secondary | ICD-10-CM | POA: Diagnosis not present

## 2017-11-09 DIAGNOSIS — I63512 Cerebral infarction due to unspecified occlusion or stenosis of left middle cerebral artery: Secondary | ICD-10-CM | POA: Diagnosis not present

## 2017-11-09 DIAGNOSIS — E0865 Diabetes mellitus due to underlying condition with hyperglycemia: Secondary | ICD-10-CM | POA: Diagnosis not present

## 2017-11-09 DIAGNOSIS — J449 Chronic obstructive pulmonary disease, unspecified: Secondary | ICD-10-CM | POA: Diagnosis present

## 2017-11-09 DIAGNOSIS — I6523 Occlusion and stenosis of bilateral carotid arteries: Secondary | ICD-10-CM | POA: Diagnosis not present

## 2017-11-09 DIAGNOSIS — I2510 Atherosclerotic heart disease of native coronary artery without angina pectoris: Secondary | ICD-10-CM | POA: Diagnosis present

## 2017-11-09 DIAGNOSIS — R29703 NIHSS score 3: Secondary | ICD-10-CM | POA: Diagnosis present

## 2017-11-09 DIAGNOSIS — Z955 Presence of coronary angioplasty implant and graft: Secondary | ICD-10-CM | POA: Diagnosis not present

## 2017-11-09 DIAGNOSIS — I63412 Cerebral infarction due to embolism of left middle cerebral artery: Secondary | ICD-10-CM | POA: Diagnosis present

## 2017-11-09 DIAGNOSIS — G9340 Encephalopathy, unspecified: Secondary | ICD-10-CM | POA: Diagnosis not present

## 2017-11-09 DIAGNOSIS — I10 Essential (primary) hypertension: Secondary | ICD-10-CM | POA: Diagnosis present

## 2017-11-09 DIAGNOSIS — R569 Unspecified convulsions: Secondary | ICD-10-CM | POA: Diagnosis not present

## 2017-11-09 DIAGNOSIS — Z7984 Long term (current) use of oral hypoglycemic drugs: Secondary | ICD-10-CM | POA: Diagnosis not present

## 2017-11-09 DIAGNOSIS — E785 Hyperlipidemia, unspecified: Secondary | ICD-10-CM | POA: Diagnosis present

## 2017-11-09 DIAGNOSIS — I639 Cerebral infarction, unspecified: Secondary | ICD-10-CM | POA: Diagnosis present

## 2017-11-09 DIAGNOSIS — Z87891 Personal history of nicotine dependence: Secondary | ICD-10-CM | POA: Diagnosis not present

## 2017-11-09 DIAGNOSIS — E1165 Type 2 diabetes mellitus with hyperglycemia: Secondary | ICD-10-CM | POA: Diagnosis present

## 2017-11-09 DIAGNOSIS — F0151 Vascular dementia with behavioral disturbance: Secondary | ICD-10-CM | POA: Diagnosis present

## 2017-11-09 DIAGNOSIS — R2689 Other abnormalities of gait and mobility: Secondary | ICD-10-CM | POA: Diagnosis not present

## 2017-11-09 DIAGNOSIS — Z7902 Long term (current) use of antithrombotics/antiplatelets: Secondary | ICD-10-CM | POA: Diagnosis not present

## 2017-11-09 DIAGNOSIS — Z7982 Long term (current) use of aspirin: Secondary | ICD-10-CM | POA: Diagnosis not present

## 2017-11-09 DIAGNOSIS — R4182 Altered mental status, unspecified: Secondary | ICD-10-CM | POA: Diagnosis present

## 2017-11-09 LAB — LIPID PANEL
CHOL/HDL RATIO: 2.7 ratio
Cholesterol: 96 mg/dL (ref 0–200)
HDL: 36 mg/dL — AB (ref >40)
LDL CALC: 36 mg/dL (ref 0–99)
TRIGLYCERIDES: 122 mg/dL (ref <150)
VLDL: 24 mg/dL (ref 0–40)

## 2017-11-09 LAB — ECHOCARDIOGRAM COMPLETE
Height: 67 in
Weight: 2522.06 oz

## 2017-11-09 LAB — GLUCOSE, CAPILLARY
GLUCOSE-CAPILLARY: 128 mg/dL — AB (ref 65–99)
Glucose-Capillary: 111 mg/dL — ABNORMAL HIGH (ref 65–99)
Glucose-Capillary: 115 mg/dL — ABNORMAL HIGH (ref 65–99)
Glucose-Capillary: 130 mg/dL — ABNORMAL HIGH (ref 65–99)
Glucose-Capillary: 132 mg/dL — ABNORMAL HIGH (ref 65–99)
Glucose-Capillary: 235 mg/dL — ABNORMAL HIGH (ref 65–99)

## 2017-11-09 LAB — HEMOGLOBIN A1C
Hgb A1c MFr Bld: 9.5 % — ABNORMAL HIGH (ref 4.8–5.6)
Mean Plasma Glucose: 225.95 mg/dL

## 2017-11-09 MED ORDER — LEVETIRACETAM 250 MG PO TABS
250.0000 mg | ORAL_TABLET | Freq: Two times a day (BID) | ORAL | Status: DC
  Administered 2017-11-09: 250 mg via ORAL
  Filled 2017-11-09 (×2): qty 1

## 2017-11-09 NOTE — Progress Notes (Signed)
Neuro check late because done as soon as patient came from MRI

## 2017-11-09 NOTE — Consult Note (Addendum)
[place] A. Merlene Laughter, MD     [email or web address]          Jacob Little is an 82 y.o. male.   ASSESSMENT/PLAN: 1. Altered mental status of unclear etiology:  Differential diagnosis includes acute ischemic stroke, metabolic derangements and the seizure.  Patient appears to have evidence of acute stroke on CT although not clearly define.  MRI is being obtained along with MRA.  If MRI is nondiagnostic, consider EEG.   2.  Extensive white matter disease which increase the risk of vascular dementia and gait impairment:  Continue with aspirin and Plavix now.  Additional  Dementia labs will be obtained.  The patient is being assessed by physical and occupational therapy.        The patient is a 82 year old white male who was found to be acutely confused and disoriented in the parking lot of a [employer].  The patient reports that he lives by himself and drove himself to [employer].  He otherwise cannot provide any history.  He knows that he is in the hospital but does not report focal deficit.  He seemed to endorse being weak all over.  His speech seem slightly dysarthric and he does report that he thinks his speech is not at baseline. He does not again report focal numbness or weakness.  He denies chest pain, palpitation, shortness of breath her headaches.  He apparently is highly independent and functional at baseline.  The review of systems otherwise negative.      GENERAL:  This is a pleasant male who is in no acute distress.  HEENT:   No trauma appreciated.  Neck is supple.  ABDOMEN: Soft  EXTREMITIES: No edema   BACK: Normal alignment.  SKIN: Normal by inspection.    MENTAL STATUS: Alert and oriented X3  Speech -  seems mildly dysarthric, language and cognition are generally intact. Judgment and insight normal.   CRANIAL NERVES: Pupils are equal, round and reactive to light and accommodation; extraocular movements are full, there is no significant  nystagmus; upper and lower facial muscles are normal in strength and symmetric, there is no flattening of the nasolabial folds; tongue is midline; uvula is midline; shoulder elevation is normal.  MOTOR: Normal tone, bulk and strength; no pronator drift.  COORDINATION: Left finger to nose is normal, right finger to nose is normal, No rest tremor; no intention tremor; no postural tremor; no bradykinesia.  REFLEXES: Deep tendon reflexes are symmetrical and normal. Plantar responses are flexor bilaterally.   SENSATION: Normal to light touch and temperature.  No clear evidence of neglect.    NIH stroke scale 1.   Blood pressure (!) 178/64, pulse (!) 58, temperature 98.2 F (36.8 C), temperature source Oral, resp. rate 16, height 5' 7" (1.702 m), weight 157 lb 10.1 oz (71.5 kg), SpO2 97 %.  Past Medical History:  Diagnosis Date  . Coronary artery disease    DES to circumflex 2006  . Essential hypertension   . Hyperlipidemia   . Rheumatic fever   . Type 2 diabetes mellitus ([place])     Past Surgical History:  Procedure Laterality Date  . APPENDECTOMY    . CORONARY ANGIOPLASTY WITH STENT PLACEMENT      History reviewed. No pertinent family history.  Social History:  reports that he quit smoking about 12 years ago. His smoking use included cigarettes. He has never used smokeless tobacco. He reports that he does not drink alcohol or use drugs.  Allergies: No  Known Allergies  Medications: Prior to Admission medications   Medication Sig Start Date End Date Taking? Authorizing Provider  aspirin EC 325 MG tablet Take 325 mg by mouth daily.   Yes [provider]  clopidogrel (PLAVIX) 75 MG tablet TAKE 1 TABLET BY MOUTH DAILY. 06/09/17  Yes Satira Sark, MD  cyanocobalamin (,VITAMIN B-12,) 1000 MCG/ML injection Inject 1,000 mcg into the muscle every 30 (thirty) days.  09/14/17  Yes [provider]  isosorbide mononitrate (IMDUR) 30 MG 24 hr tablet Take 1 tablet (30 mg  total) by mouth 2 (two) times daily. 12/24/16 11/08/17 Yes Satira Sark, MD  metFORMIN (GLUCOPHAGE) 1000 MG tablet Take 1,000 mg by mouth 2 (two) times daily with a meal.     Yes [provider]  metoprolol tartrate (LOPRESSOR) 25 MG tablet TAKE 1 TABLET BY MOUTH 2 TIMES DAILY. 06/09/17  Yes Satira Sark, MD  nitroGLYCERIN (NITROSTAT) 0.4 MG SL tablet Place 0.4 mg under the tongue every 5 (five) minutes as needed for chest pain.   Yes [provider]  OZEMPIC 0.25 or 0.5 MG/DOSE SOPN once a week. 11/05/17  Yes [provider]  simvastatin (ZOCOR) 20 MG tablet TAKE 1 TABLET BY MOUTH DAILY. 06/09/17  Yes Satira Sark, MD    Scheduled Meds: . aspirin  300 mg Rectal Daily   Or  . aspirin  325 mg Oral Daily  . enoxaparin (LOVENOX) injection  40 mg Subcutaneous Q24H  . insulin aspart  0-9 Units Subcutaneous Q4H   Continuous Infusions: . sodium chloride 100 mL/hr at 11/08/17 1600   PRN Meds:.acetaminophen **OR** acetaminophen (TYLENOL) oral liquid 160 mg/5 mL **OR** acetaminophen, senna-docusate     Results for orders placed or performed during the hospital encounter of 11/08/17 (from the past 48 hour(s))  CBG monitoring, ED     Status: Abnormal   Collection Time: 11/08/17 12:21 PM  Result Value Ref Range   Glucose-Capillary 303 (H) 65 - 99 mg/dL  Comprehensive metabolic panel     Status: Abnormal   Collection Time: 11/08/17 12:29 PM  Result Value Ref Range   Sodium 135 135 - 145 mmol/L   Potassium 4.5 3.5 - 5.1 mmol/L   Chloride 102 101 - 111 mmol/L   CO2 23 22 - 32 mmol/L   Glucose, Bld 309 (H) 65 - 99 mg/dL   BUN 14 6 - 20 mg/dL   Creatinine, Ser 1.08 0.61 - 1.24 mg/dL   Calcium 9.4 8.9 - 10.3 mg/dL   Total Protein 6.9 6.5 - 8.1 g/dL   Albumin 3.9 3.5 - 5.0 g/dL   AST 17 15 - 41 U/L   ALT 15 (L) 17 - 63 U/L   Alkaline Phosphatase 48 38 - 126 U/L   Total Bilirubin 1.1 0.3 - 1.2 mg/dL   GFR calc non Af Amer >60 >60 mL/min   GFR calc Af  Amer >60 >60 mL/min    Comment: (NOTE) The eGFR has been calculated using the CKD EPI equation. This calculation has not been validated in all clinical situations. eGFR's persistently <60 mL/min signify possible Chronic Kidney Disease.    Anion gap 10 5 - 15    Comment: Performed at [facility], [address] [place], [place] [address]  CBC     Status: None   Collection Time: 11/08/17 12:29 PM  Result Value Ref Range   WBC 9.8 4.0 - 10.5 K/uL   RBC 4.52 4.22 - 5.81 MIL/uL   Hemoglobin 13.5  13.0 - 17.0 g/dL   HCT 39.9 39.0 - 52.0 %   MCV 88.3 78.0 - 100.0 fL   MCH 29.9 26.0 - 34.0 pg   MCHC 33.8 30.0 - 36.0 g/dL   RDW 12.5 11.5 - 15.5 %   Platelets 275 150 - 400 K/uL    Comment: Performed at [facility], [address] [place], [place] [address]  Ethanol     Status: None   Collection Time: 11/08/17 12:29 PM  Result Value Ref Range   Alcohol, Ethyl (B) <10 <10 mg/dL    Comment: Performed at [facility], [address] [place], [place] [address]  Protime-INR     Status: None   Collection Time: 11/08/17 12:29 PM  Result Value Ref Range   Prothrombin Time 13.5 11.4 - 15.2 seconds   INR 1.04     Comment: Performed at [facility], [address], [place], [place] [address]  APTT     Status: None   Collection Time: 11/08/17 12:29 PM  Result Value Ref Range   aPTT 24 24 - 36 seconds    Comment: Performed at [facility], [address] [place], [place] [address]  Differential     Status: None   Collection Time: 11/08/17 12:29 PM  Result Value Ref Range   Neutrophils Relative % 76 %   Neutro Abs 7.2 1.7 - 7.7 K/uL   Lymphocytes Relative 17 %   Lymphs Abs 1.6 0.7 - 4.0 K/uL   Monocytes Relative 6 %   Monocytes Absolute 0.6 0.1 - 1.0 K/uL   Eosinophils Relative 1 %   Eosinophils Absolute 0.1 0.0 - 0.7 K/uL   Basophils Relative 0 %   Basophils Absolute 0.0 0.0 - 0.1 K/uL    Comment: Performed at [facility], [address] [place], [place] [address]  Urine rapid drug  screen (hosp performed)not at [facility]     Status: None   Collection Time: 11/08/17 12:29 PM  Result Value Ref Range   Opiates NONE DETECTED NONE DETECTED   Cocaine NONE DETECTED NONE DETECTED   Benzodiazepines NONE DETECTED NONE DETECTED   Amphetamines NONE DETECTED NONE DETECTED   Tetrahydrocannabinol NONE DETECTED NONE DETECTED   Barbiturates NONE DETECTED NONE DETECTED    Comment: (NOTE) DRUG SCREEN FOR MEDICAL PURPOSES ONLY.  IF CONFIRMATION IS NEEDED FOR ANY PURPOSE, NOTIFY LAB WITHIN 5 DAYS. LOWEST DETECTABLE LIMITS FOR URINE DRUG SCREEN Drug Class                     Cutoff (ng/mL) Amphetamine and metabolites    1000 Barbiturate and metabolites    200 Benzodiazepine                 211 Tricyclics and metabolites     300 Opiates and metabolites        300 Cocaine and metabolites        300 THC                            50 Performed at [facility], [address] [place], [place] [address]   Urinalysis, Routine w reflex microscopic     Status: Abnormal   Collection Time: 11/08/17 12:29 PM  Result Value Ref Range   Color, Urine YELLOW YELLOW   APPearance CLEAR CLEAR   Specific Gravity, Urine 1.021 1.005 - 1.030   pH 5.0 5.0 - 8.0   Glucose, UA >=500 (A) NEGATIVE mg/dL  Hgb urine dipstick NEGATIVE NEGATIVE   Bilirubin Urine NEGATIVE NEGATIVE   Ketones, ur 5 (A) NEGATIVE mg/dL   Protein, ur NEGATIVE NEGATIVE mg/dL   Nitrite NEGATIVE NEGATIVE   Leukocytes, UA NEGATIVE NEGATIVE   RBC / HPF 0-5 0 - 5 RBC/hpf   WBC, UA 0-5 0 - 5 WBC/hpf   Bacteria, UA NONE SEEN NONE SEEN   Squamous Epithelial / LPF 0-5 0 - 5   Mucus PRESENT     Comment: Performed at [facility], [address], [place], [place] [address]  Troponin I     Status: None   Collection Time: 11/08/17 12:29 PM  Result Value Ref Range   Troponin I <0.03 <0.03 ng/mL    Comment: Performed at [facility], [address], [place], [place] [address]  Glucose, capillary     Status: None   Collection Time: 11/08/17   5:06 PM  Result Value Ref Range   Glucose-Capillary 99 65 - 99 mg/dL  Glucose, capillary     Status: Abnormal   Collection Time: 11/08/17  7:46 PM  Result Value Ref Range   Glucose-Capillary 102 (H) 65 - 99 mg/dL  Glucose, capillary     Status: Abnormal   Collection Time: 11/09/17 12:10 AM  Result Value Ref Range   Glucose-Capillary 111 (H) 65 - 99 mg/dL  Glucose, capillary     Status: Abnormal   Collection Time: 11/09/17  4:20 AM  Result Value Ref Range   Glucose-Capillary 115 (H) 65 - 99 mg/dL  Lipid panel     Status: Abnormal   Collection Time: 11/09/17  4:47 AM  Result Value Ref Range   Cholesterol 96 0 - 200 mg/dL   Triglycerides 122 <150 mg/dL   HDL 36 (L) >40 mg/dL   Total CHOL/HDL Ratio 2.7 RATIO   VLDL 24 0 - 40 mg/dL   LDL Cholesterol 36 0 - 99 mg/dL    Comment:        Total Cholesterol/HDL:CHD Risk Coronary Heart Disease Risk Table                     Men   Women  1/2 Average Risk   3.4   3.3  Average Risk       5.0   4.4  2 X Average Risk   9.6   7.1  3 X Average Risk  23.4   11.0        Use the calculated Patient Ratio above and the CHD Risk Table to determine the patient's CHD Risk.        ATP III CLASSIFICATION (LDL):  <100     mg/dL   Optimal  100-129  mg/dL   Near or Above                    Optimal  130-159  mg/dL   Borderline  160-189  mg/dL   High  >190     mg/dL   Very High Performed at [facility], [address] [place],  [address]   Glucose, capillary     Status: Abnormal   Collection Time: 11/09/17  7:41 AM  Result Value Ref Range   Glucose-Capillary 130 (H) 65 - 99 mg/dL   Comment 1 Notify RN    Comment 2 Document in Chart     Studies/Results:  HEAD CT FINDINGS: Brain: There is mild to moderate diffuse atrophy. There is no intracranial mass, hemorrhage, extra-axial fluid collection, or midline shift. There  is patchy small vessel disease in the centra semiovale bilaterally. There is evidence of an age uncertain infarct at  the right frontoparietal junction level. No other focal infarct evident.  Vascular: No hyperdense vessel. Calcification in both carotid arteries evident.  Skull: Bony calvarium appears intact.  Sinuses/Orbits: There is mucosal thickening in multiple ethmoid air cells. There is a small retention cyst in the posteromedial right maxillary antrum. Other visualized paranasal sinuses are clear. Orbits appear symmetric bilaterally.  Other: Mastoid air cells are clear.  IMPRESSION: Atrophy with patchy periventricular small vessel disease. Age uncertain but potentially recent infarct at the right frontal-parietal junction.  No mass or hemorrhage. Foci of arterial vascular calcification noted. There are foci of paranasal sinus disease.      Head CT scan is reviewed in person and shows moderate global atrophy.  There is severe deep white matter and the periventricular leukoencephalopathy.  There is evidence of focal hypodensity involving the posterior frontal and parietal region on the left side suggestive of subacute infarct.       A. Merlene Laughter, M.D.  Diplomate, Tax adviser of Psychiatry and Neurology ( Neurology). 11/09/2017, 8:20 AM

## 2017-11-09 NOTE — Procedures (Signed)
  [place] A. Merlene Laughter, MD     [email or web address]           HISTORY:   This is a 82 year old man who presents with acute confusional state.  The study is being done to evaluate for seizures as the etiology.  MEDICATIONS: Scheduled Meds: . aspirin  300 mg Rectal Daily   Or  . aspirin  325 mg Oral Daily  . enoxaparin (LOVENOX) injection  40 mg Subcutaneous Q24H  . insulin aspart  0-9 Units Subcutaneous Q4H   Continuous Infusions: . sodium chloride 100 mL/hr at 11/09/17 1252   PRN Meds:.acetaminophen **OR** acetaminophen (TYLENOL) oral liquid 160 mg/5 mL **OR** acetaminophen, senna-docusate  Prior to Admission medications   Medication Sig Start Date End Date Taking? Authorizing Provider  aspirin EC 325 MG tablet Take 325 mg by mouth daily.   Yes [provider]  clopidogrel (PLAVIX) 75 MG tablet TAKE 1 TABLET BY MOUTH DAILY. 06/09/17  Yes Satira Sark, MD  cyanocobalamin (,VITAMIN B-12,) 1000 MCG/ML injection Inject 1,000 mcg into the muscle every 30 (thirty) days.  09/14/17  Yes [provider]  isosorbide mononitrate (IMDUR) 30 MG 24 hr tablet Take 1 tablet (30 mg total) by mouth 2 (two) times daily. 12/24/16 11/08/17 Yes Satira Sark, MD  metFORMIN (GLUCOPHAGE) 1000 MG tablet Take 1,000 mg by mouth 2 (two) times daily with a meal.     Yes [provider]  metoprolol tartrate (LOPRESSOR) 25 MG tablet TAKE 1 TABLET BY MOUTH 2 TIMES DAILY. 06/09/17  Yes Satira Sark, MD  nitroGLYCERIN (NITROSTAT) 0.4 MG SL tablet Place 0.4 mg under the tongue every 5 (five) minutes as needed for chest pain.   Yes [provider]  OZEMPIC 0.25 or 0.5 MG/DOSE SOPN once a week. 11/05/17  Yes [provider]  simvastatin (ZOCOR) 20 MG tablet TAKE 1 TABLET BY MOUTH DAILY. 06/09/17  Yes Satira Sark, MD      ANALYSIS: A 16 channel recording using standard 10 20 measurements is conducted for 22 minutes.  The background  activity gets as high as 8.5 hertz.  There is sleep architecture observed with K complexes and some spindles.  Photic stimulation and hyperventilation were not conducted.  Focal and lateralized slowing not observed.  There are 2 episodes of sharp wave activity that phase reverse at T3.    IMPRESSION: 1.  This recording is slightly abnormal showing rare left temporal epileptiform discharges.  The patient will be considered for low-dose seizure medication given the EEG and semiology of his spell.      Desani Sprung A. Merlene Laughter, M.D.  Diplomate, Tax adviser of Psychiatry and Neurology ( Neurology).
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
## 2017-11-09 NOTE — Progress Notes (Signed)
EEG completed, results pending. 

## 2017-11-09 NOTE — Progress Notes (Signed)
*  PRELIMINARY RESULTS* Echocardiogram 2D Echocardiogram has been performed.  Jacob Little 11/09/2017, 11:39 AM

## 2017-11-09 NOTE — Plan of Care (Signed)
  Problem: Acute Rehab PT Goals(only PT should resolve) Goal: Pt Will Go Supine/Side To Sit Outcome: Progressing Flowsheets (Taken 11/09/2017 1106) Pt will go Supine/Side to Sit: Independently Goal: Patient Will Transfer Sit To/From Stand Outcome: Progressing Flowsheets (Taken 11/09/2017 1106) Patient will transfer sit to/from stand: Independently Goal: Pt Will Transfer Bed To Chair/Chair To Bed Outcome: Progressing Flowsheets (Taken 11/09/2017 1106) Pt will Transfer Bed to Chair/Chair to Bed: Independently Goal: Pt Will Ambulate Outcome: Progressing Flowsheets (Taken 11/09/2017 1106) Pt will Ambulate: Independently;> 125 feet   11:07 AM, 11/09/17 Lonell Grandchild, MPT Physical Therapist with [facility] [phone] office [phone] mobile phone

## 2017-11-09 NOTE — Care Management Note (Signed)
Case Management Note  Patient Details  Name: Jacob Little MRN: [id number] Date of Birth: [birth year]  Subjective/Objective:    Admitted with CVA. Pt confused, information received information from daughter at bedside. Pt lives alone. Does not clean, does not cook, eat very poorly. Pt has been falling a lot recently. Pt has PCP but per daughter "would not have insurance if [she] had not signed him up". Pt owns his home, has no savings. He has two daughters one daughter recently has "mental breakdown" and now  Jacob Little (daughter at bedside) is caring for her children. Jacob Little works a full time job. She does not have a good or close relationship with her father. She will not go out of her way to care for him. He is not allowed to stay in her home. Pt ambulatory and daughter was in the room when PT did their assessment, recommending HH PT. OT recommending SNF. Pt cognitively unsafe to be alone, per daughter pt will have not one with him 24/7 at DC.       Action/Plan: CM discussed uncertainty over whether or not HTA would approve SNF with only a OT recommendation. CM has asked CSW to contact them and see.   CM encouraged daughter to contact both DSS and ADTS to determine if pt is eligible for any assistance or programs.   CM explained what Medicare will pay for in regards to [facility] and the services we could order for him at DC.   CM tried to given daughter her phone number to stay in communication after calling ADTS and DSS but daughter declined stating "Why? There is nothing you can do to help."  Expected Discharge Date:      11/11/17            Expected Discharge Plan:     In-House Referral:  Clinical Social Work  Discharge planning Services  CM Consult  Post Acute Care Choice:    Choice offered to:     DME Arranged:    DME Agency:     HH Arranged:    HH Agency:     Status of Service:  In process, will continue to follow  If discussed at Long Length of Stay Meetings, dates discussed:     Additional Comments:  Sherald Barge, RN 11/09/2017, 11:30 AM

## 2017-11-09 NOTE — Clinical Social Work Note (Signed)
LCSW spoke with patient's daughter and advised that [employer] stated that patient's Medicare part B would pay for him to go to SNF, however they would be responsible for room and board.  Ms. Jacob Little stated "well he'll just go home."    LCSW signing off.    Roderic Lammert, Clydene Pugh, LCSW

## 2017-11-10 DIAGNOSIS — E785 Hyperlipidemia, unspecified: Secondary | ICD-10-CM

## 2017-11-10 DIAGNOSIS — G9340 Encephalopathy, unspecified: Secondary | ICD-10-CM

## 2017-11-10 DIAGNOSIS — I10 Essential (primary) hypertension: Secondary | ICD-10-CM

## 2017-11-10 DIAGNOSIS — I2510 Atherosclerotic heart disease of native coronary artery without angina pectoris: Secondary | ICD-10-CM

## 2017-11-10 DIAGNOSIS — R4182 Altered mental status, unspecified: Secondary | ICD-10-CM | POA: Diagnosis not present

## 2017-11-10 DIAGNOSIS — R2689 Other abnormalities of gait and mobility: Secondary | ICD-10-CM | POA: Diagnosis not present

## 2017-11-10 DIAGNOSIS — E0865 Diabetes mellitus due to underlying condition with hyperglycemia: Secondary | ICD-10-CM

## 2017-11-10 DIAGNOSIS — I63512 Cerebral infarction due to unspecified occlusion or stenosis of left middle cerebral artery: Secondary | ICD-10-CM

## 2017-11-10 LAB — GLUCOSE, CAPILLARY
GLUCOSE-CAPILLARY: 137 mg/dL — AB (ref 65–99)
GLUCOSE-CAPILLARY: 138 mg/dL — AB (ref 65–99)
GLUCOSE-CAPILLARY: 194 mg/dL — AB (ref 65–99)
Glucose-Capillary: 105 mg/dL — ABNORMAL HIGH (ref 65–99)
Glucose-Capillary: 131 mg/dL — ABNORMAL HIGH (ref 65–99)
Glucose-Capillary: 143 mg/dL — ABNORMAL HIGH (ref 65–99)

## 2017-11-10 LAB — VITAMIN B12: VITAMIN B 12: 659 pg/mL (ref 180–914)

## 2017-11-10 LAB — TSH: TSH: 2.783 u[IU]/mL (ref 0.350–4.500)

## 2017-11-10 MED ORDER — ISOSORBIDE MONONITRATE ER 60 MG PO TB24
30.0000 mg | ORAL_TABLET | Freq: Every day | ORAL | Status: DC
  Administered 2017-11-11 – 2017-11-12 (×2): 30 mg via ORAL
  Filled 2017-11-10 (×2): qty 1

## 2017-11-10 MED ORDER — DIVALPROEX SODIUM 250 MG PO DR TAB
250.0000 mg | DELAYED_RELEASE_TABLET | Freq: Three times a day (TID) | ORAL | Status: DC
  Administered 2017-11-10 – 2017-11-12 (×5): 250 mg via ORAL
  Filled 2017-11-10 (×5): qty 1

## 2017-11-10 MED ORDER — LORAZEPAM 2 MG/ML IJ SOLN
1.0000 mg | INTRAMUSCULAR | Status: DC | PRN
  Administered 2017-11-10: 1 mg via INTRAVENOUS
  Filled 2017-11-10: qty 1

## 2017-11-10 MED ORDER — QUETIAPINE FUMARATE 25 MG PO TABS
12.5000 mg | ORAL_TABLET | Freq: Two times a day (BID) | ORAL | Status: DC
  Administered 2017-11-10 – 2017-11-12 (×5): 12.5 mg via ORAL
  Filled 2017-11-10 (×5): qty 1

## 2017-11-10 MED ORDER — METOPROLOL TARTRATE 25 MG PO TABS
12.5000 mg | ORAL_TABLET | Freq: Two times a day (BID) | ORAL | Status: DC
  Administered 2017-11-10 – 2017-11-12 (×4): 12.5 mg via ORAL
  Filled 2017-11-10 (×4): qty 1
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
## 2017-11-10 NOTE — Progress Notes (Signed)
[place] A. Merlene Laughter, MD     [email or web address]          Jacob Little is an 82 y.o. male.   Assessment/Plan:  1. Altered mental status of unclear etiology: The patient has 2 tiny infarcts which I am doubtful could explain the patient's encephalopathy/confusion.  EEG shows risk of seizures.  It is possible the patient may have had unwitnessed seizures and therefore he will be started low dose Sz med. He was initially started on Keppra but given significant behavioral problems, I think Depakote will be a better option.  He will be switched to Depakote 250 -  3 times a day.  2.  Extensive white matter disease which increase the risk of vascular dementia and gait impairment:  Continue with aspirin and Plavix now.  However, we should discontinue the Plavix on discharge especially given MRI showed multiple microhemorrhage.  A 30-day event monitor is recommended to evaluate for potential cardioembolic from Afib. Additional  Dementia labs will be obtained.  The patient is being assessed by physical and occupational therapy.  3.  Remote right posterior frontal cortical infarct: 30-day event monitor is recommended.  MRA shows no significant large vessel     The history is obtained from the daughter who is in the room.  She reports that he has been having memory problems.  The appears to be evidence of the patient having impulsivity, behavioral problems and irritability.  He does appears to be irritable this morning. Patient's mother also apparently had significant cognitive impairment / dementia. 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   GENERAL:  This is a pleasant male who is in no acute distress.  He is a somewhat irritated today and impulsive.  HEENT:   No trauma appreciated.  Neck is supple.  ABDOMEN: Soft  EXTREMITIES: No edema   BACK: Normal alignment.  SKIN: Normal by inspection.      MENTAL STATUS: Alert and oriented X3  Speech -  seems mildly dysarthric, language and cognition are generally intact. Judgment and insight normal.   CRANIAL NERVES: Pupils are equal, round and reactive to light and accommodation; extraocular movements are full, there is no significant nystagmus; upper and lower facial muscles are normal in strength and symmetric, there is no flattening of the nasolabial folds; tongue is midline; uvula is midline; shoulder elevation is normal.  MOTOR: Normal tone, bulk and strength; no pronator drift.  COORDINATION: Left finger to nose is normal, right finger to nose is normal, No rest tremor; no intention tremor; no postural tremor; no bradykinesia.  REFLEXES: Deep tendon reflexes are symmetrical and normal. Plantar responses are flexor bilaterally.   SENSATION: Normal to light touch and temperature.  No clear evidence of neglect.      Objective: Vital signs in last 24 hours: Temp:  [97.4 F (36.3 C)-98.5 F (36.9 C)] 97.4 F (36.3 C) (05/28 2230) Pulse Rate:  [45-85] 45 (05/28 2230) Resp:  [16] 16 (05/28 1815) BP: (141-182)/(64-85) 141/64 (05/28 2230) SpO2:  [95 %-99 %] 95 % (05/28 2230)  Intake/Output from previous day: 05/28 0701 - 05/29 0700 In: -  Out: 100 [Urine:100] Intake/Output this shift: No intake/output data recorded. Nutritional status:  Diet Order           Diet Carb Modified Fluid consistency: Thin; Room service appropriate? Yes  Diet effective now           Lab Results: Results for  orders placed or performed during the hospital encounter of 11/08/17 (from the past 48 hour(s))  CBG monitoring, ED     Status: Abnormal   Collection Time: 11/08/17 12:21 PM  Result Value Ref Range   Glucose-Capillary 303 (H) 65 - 99 mg/dL  Comprehensive metabolic panel     Status: Abnormal   Collection Time: 11/08/17 12:29 PM  Result Value Ref Range   Sodium 135 135 - 145 mmol/L   Potassium 4.5 3.5 - 5.1 mmol/L   Chloride 102 101 -  111 mmol/L   CO2 23 22 - 32 mmol/L   Glucose, Bld 309 (H) 65 - 99 mg/dL   BUN 14 6 - 20 mg/dL   Creatinine, Ser 1.08 0.61 - 1.24 mg/dL   Calcium 9.4 8.9 - 10.3 mg/dL   Total Protein 6.9 6.5 - 8.1 g/dL   Albumin 3.9 3.5 - 5.0 g/dL   AST 17 15 - 41 U/L   ALT 15 (L) 17 - 63 U/L   Alkaline Phosphatase 48 38 - 126 U/L   Total Bilirubin 1.1 0.3 - 1.2 mg/dL   GFR calc non Af Amer >60 >60 mL/min   GFR calc Af Amer >60 >60 mL/min    Comment: (NOTE) The eGFR has been calculated using the CKD EPI equation. This calculation has not been validated in all clinical situations. eGFR's persistently <60 mL/min signify possible Chronic Kidney Disease.    Anion gap 10 5 - 15    Comment: Performed at [facility], [address], [place], [place] [address]  CBC     Status: None   Collection Time: 11/08/17 12:29 PM  Result Value Ref Range   WBC 9.8 4.0 - 10.5 K/uL   RBC 4.52 4.22 - 5.81 MIL/uL   Hemoglobin 13.5 13.0 - 17.0 g/dL   HCT 39.9 39.0 - 52.0 %   MCV 88.3 78.0 - 100.0 fL   MCH 29.9 26.0 - 34.0 pg   MCHC 33.8 30.0 - 36.0 g/dL   RDW 12.5 11.5 - 15.5 %   Platelets 275 150 - 400 K/uL    Comment: Performed at [facility], [address], [place], [place] [address]  Ethanol     Status: None   Collection Time: 11/08/17 12:29 PM  Result Value Ref Range   Alcohol, Ethyl (B) <10 <10 mg/dL    Comment: Performed at [facility], [address], [place], [place] [address]  Protime-INR     Status: None   Collection Time: 11/08/17 12:29 PM  Result Value Ref Range   Prothrombin Time 13.5 11.4 - 15.2 seconds   INR 1.04     Comment: Performed at [facility], [address], [place], [place] [address]  APTT     Status: None   Collection Time: 11/08/17 12:29 PM  Result Value Ref Range   aPTT 24 24 - 36 seconds    Comment: Performed at [facility], [address], [place], [place] [address]  Differential     Status: None   Collection Time: 11/08/17 12:29 PM  Result Value Ref Range   Neutrophils  Relative % 76 %   Neutro Abs 7.2 1.7 - 7.7 K/uL   Lymphocytes Relative 17 %   Lymphs Abs 1.6 0.7 - 4.0 K/uL   Monocytes Relative 6 %   Monocytes Absolute 0.6 0.1 - 1.0 K/uL   Eosinophils Relative 1 %   Eosinophils Absolute 0.1 0.0 - 0.7 K/uL   Basophils Relative 0 %   Basophils Absolute 0.0  0.0 - 0.1 K/uL    Comment: Performed at [facility], [address] [place] [address], [place], [place] [address]  Urine rapid drug screen (hosp performed)not at [facility]     Status: None   Collection Time: 11/08/17 12:29 PM  Result Value Ref Range   Opiates NONE DETECTED NONE DETECTED   Cocaine NONE DETECTED NONE DETECTED   Benzodiazepines NONE DETECTED NONE DETECTED   Amphetamines NONE DETECTED NONE DETECTED   Tetrahydrocannabinol NONE DETECTED NONE DETECTED   Barbiturates NONE DETECTED NONE DETECTED    Comment: (NOTE) DRUG SCREEN FOR MEDICAL PURPOSES ONLY.  IF CONFIRMATION IS NEEDED FOR ANY PURPOSE, NOTIFY LAB WITHIN 5 DAYS. LOWEST DETECTABLE LIMITS FOR URINE DRUG SCREEN Drug Class                     Cutoff (ng/mL) Amphetamine and metabolites    1000 Barbiturate and metabolites    200 Benzodiazepine                 629 Tricyclics and metabolites     300 Opiates and metabolites        300 Cocaine and metabolites        300 THC                            50 Performed at [facility], [address], [place], [place] [address]   Urinalysis, Routine w reflex microscopic     Status: Abnormal   Collection Time: 11/08/17 12:29 PM  Result Value Ref Range   Color, Urine YELLOW YELLOW   APPearance CLEAR CLEAR   Specific Gravity, Urine 1.021 1.005 - 1.030   pH 5.0 5.0 - 8.0   Glucose, UA >=500 (A) NEGATIVE mg/dL   Hgb urine dipstick NEGATIVE NEGATIVE   Bilirubin Urine NEGATIVE NEGATIVE   Ketones, ur 5 (A) NEGATIVE mg/dL   Protein, ur NEGATIVE NEGATIVE mg/dL   Nitrite NEGATIVE NEGATIVE   Leukocytes, UA NEGATIVE NEGATIVE   RBC / HPF 0-5 0 - 5 RBC/hpf   WBC, UA 0-5 0 - 5 WBC/hpf   Bacteria, UA NONE SEEN NONE  SEEN   Squamous Epithelial / LPF 0-5 0 - 5   Mucus PRESENT     Comment: Performed at [facility], [address] [place],  [address]  Troponin I     Status: None   Collection Time: 11/08/17 12:29 PM  Result Value Ref Range   Troponin I <0.03 <0.03 ng/mL    Comment: Performed at [facility], [address] [place], [place] [address]  Glucose, capillary     Status: None   Collection Time: 11/08/17  5:06 PM  Result Value Ref Range   Glucose-Capillary 99 65 - 99 mg/dL  Glucose, capillary     Status: Abnormal   Collection Time: 11/08/17  7:46 PM  Result Value Ref Range   Glucose-Capillary 102 (H) 65 - 99 mg/dL  Glucose, capillary     Status: Abnormal   Collection Time: 11/09/17 12:10 AM  Result Value Ref Range   Glucose-Capillary 111 (H) 65 - 99 mg/dL  Glucose, capillary     Status: Abnormal   Collection Time: 11/09/17  4:20 AM  Result Value Ref Range   Glucose-Capillary 115 (H) 65 - 99 mg/dL  Hemoglobin A1c     Status: Abnormal   Collection Time: 11/09/17  4:47 AM  Result Value Ref Range   Hgb A1c MFr Bld 9.5 (H) 4.8 - 5.6 %  Comment: (NOTE) Pre diabetes:          5.7%-6.4% Diabetes:              >6.4% Glycemic control for   <7.0% adults with diabetes    Mean Plasma Glucose 225.95 mg/dL    Comment: Performed at [facility] [address], [place], [place] [address]  Lipid panel     Status: Abnormal   Collection Time: 11/09/17  4:47 AM  Result Value Ref Range   Cholesterol 96 0 - 200 mg/dL   Triglycerides 122 <150 mg/dL   HDL 36 (L) >40 mg/dL   Total CHOL/HDL Ratio 2.7 RATIO   VLDL 24 0 - 40 mg/dL   LDL Cholesterol 36 0 - 99 mg/dL    Comment:        Total Cholesterol/HDL:CHD Risk Coronary Heart Disease Risk Table                     Men   Women  1/2 Average Risk   3.4   3.3  Average Risk       5.0   4.4  2 X Average Risk   9.6   7.1  3 X Average Risk  23.4   11.0        Use the calculated Patient Ratio above and the CHD Risk Table to determine  the patient's CHD Risk.        ATP III CLASSIFICATION (LDL):  <100     mg/dL   Optimal  100-129  mg/dL   Near or Above                    Optimal  130-159  mg/dL   Borderline  160-189  mg/dL   High  >190     mg/dL   Very High Performed at [facility], [address] [place], [place] [address]   Glucose, capillary     Status: Abnormal   Collection Time: 11/09/17  7:41 AM  Result Value Ref Range   Glucose-Capillary 130 (H) 65 - 99 mg/dL   Comment 1 Notify RN    Comment 2 Document in Chart   Glucose, capillary     Status: Abnormal   Collection Time: 11/09/17 11:40 AM  Result Value Ref Range   Glucose-Capillary 128 (H) 65 - 99 mg/dL   Comment 1 Notify RN    Comment 2 Document in Chart   Glucose, capillary     Status: Abnormal   Collection Time: 11/09/17  4:08 PM  Result Value Ref Range   Glucose-Capillary 235 (H) 65 - 99 mg/dL   Comment 1 Notify RN    Comment 2 Document in Chart   Glucose, capillary     Status: Abnormal   Collection Time: 11/09/17  9:01 PM  Result Value Ref Range   Glucose-Capillary 132 (H) 65 - 99 mg/dL  Glucose, capillary     Status: Abnormal   Collection Time: 11/10/17 12:32 AM  Result Value Ref Range   Glucose-Capillary 105 (H) 65 - 99 mg/dL  Glucose, capillary     Status: Abnormal   Collection Time: 11/10/17  3:24 AM  Result Value Ref Range   Glucose-Capillary 143 (H) 65 - 99 mg/dL  Glucose, capillary     Status: Abnormal   Collection Time: 11/10/17  8:17 AM  Result Value Ref Range   Glucose-Capillary 131 (H) 65 - 99 mg/dL   Comment 1 Notify RN    Comment 2  Document in Chart     Lipid Panel Recent Labs    11/09/17 0447  CHOL 96  TRIG 122  HDL 36*  CHOLHDL 2.7  VLDL 24  LDLCALC 36    Studies/Results:   BRAIN MRI MRA NECK MRA FINDINGS: MRI HEAD FINDINGS  Brain: A punctate acute nonhemorrhagic infarct is present in the lateral posterior right occipital lobe on image 76 of series 4. A white matter punctate nonhemorrhagic infarct is  noted anteriorly in the left frontal lobe on image 86 of series 4.  Periventricular lesions are consistent with remote chronic white matter disease. A remote posterior right frontal lobe infarct is noted. The ventricles are proportionate to the degree of atrophy. No significant extra-axial fluid collection is present.  Extensive punctate subcortical susceptibility is noted. There is evidence for remote hemorrhage the left superior frontal sulcus.  Internal auditory canals are within normal limits bilaterally. The brainstem and cerebellum are normal.  Vascular: Flow is present in the major intracranial arteries.  Skull and upper cervical spine: The skull base is within normal limits. The craniocervical junction is normal. A 1.5 cm cyst is noted at the foramen cecum of the maxilla.  Sinuses/Orbits: Mild mucosal thickening is present in the ethmoid air cells bilaterally. No fluid levels are present. Bilateral lens replacements are present. Globes and orbits are otherwise within normal limits.  MRA HEAD FINDINGS  The internal carotid arteries are within normal limits from the skull base through the ICA termini bilaterally. The A1 and M1 segments are normal. The anterior communicating artery is patent. Proximal left MCA bifurcation is a normal variant. ACA and MCA branch vessels are within normal limits.  The right vertebral artery is the dominant vessel. PICA origins are below the field of view. The basilar artery is normal. Both posterior cerebral arteries originate from the basilar tip. Signal loss in the proximal posterior cerebral arteries bilaterally is likely artifactual.  MRA NECK FINDINGS  Time-of-flight images in the neck are significantly degraded by patient motion through the carotid bifurcations. No definite flow disturbance is present on either side. Flow is antegrade within the vertebral arteries bilaterally. There is tortuosity of the distal cervical  right ICA without focal stenosis.  IMPRESSION: 1. Punctate posterolateral right occipital lobe infarct. 2. Punctate white matter infarct in the anterior left frontal lobe. 3. Extensive atrophy and white matter disease is consistent with small vessel disease elsewhere. 4. MRA circle-of-Willis demonstrates mild distal small vessel disease without a significant proximal stenosis, aneurysm, or branch vessel occlusion. 5. MRA neck is significantly degraded by patient motion. No definite stenosis is present.   The MRI and MRA are reviewed in person.  No large vessel disease is appreciated.  They are 2 tiny areas of increased signal on DWI involving the right temporal region and left frontal region.  The left frontal region seems more subacute while the right lesion seems more acute.  There are multiple areas of low signal intensity indicating multiple areas of microvascular hemorrhage that are remote.  There is moderate increased signal on FLAIR imaging indicate a moderate periventricular deep white matter microvascular ischemic changes.  There is a distinct area of increased signal on flair involving the right posterior frontal region wedge-shaped and associated with subtle evidence of encephalomalacia indicating a remote cortical infarct.   Medications:  Scheduled Meds: . aspirin  300 mg Rectal Daily   Or  . aspirin  325 mg Oral Daily  . enoxaparin (LOVENOX) injection  40 mg Subcutaneous Q24H  .  insulin aspart  0-9 Units Subcutaneous Q4H  . levETIRAcetam  250 mg Oral BID   Continuous Infusions: . sodium chloride 100 mL/hr at 11/10/17 0033   PRN Meds:.acetaminophen **OR** acetaminophen (TYLENOL) oral liquid 160 mg/5 mL **OR** acetaminophen, LORazepam, senna-docusate     LOS: 1 day   Ladarian Bonczek A. Merlene Laughter, M.D.  Diplomate, Tax adviser of Psychiatry and Neurology ( Neurology).

## 2017-11-10 NOTE — Clinical Social Work Note (Signed)
LCSW following. Pt discussed in Progression today. Per MD, pt is not safe to dc home alone. PT is recommending HH PT, OT recommending SNF. Pt's insurance will not pay for SNF stay based on these recommendations. Pt reportedly does not have the funds to pay privately for care.   Spoke with pt's daughter, Brayton Layman, by phone to discuss dc planning. Brayton Layman is willing to look into the option of Assisted Living facility placement for pt. LCSW spoke with Estill Bamberg at [facility] in [place] to inquire on options for pt. Per Estill Bamberg, they would need to assess pt here at the hospital and pt's daughter would need to start the Medicaid application process. If they can accept pt and Medicaid worker feels pt will qualify, they can admit pt with only the $1600 community fee due at the time of admission.   Updated pt's daughter, Brayton Layman, on above and provided contact numbers for [facility] [place] [facility] and also [facility]. Brayton Layman states she will call both and work on plan. Brayton Layman has contact numbers for CSW and will stay in touch.   Will follow.
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
## 2017-11-11 LAB — GLUCOSE, CAPILLARY
GLUCOSE-CAPILLARY: 117 mg/dL — AB (ref 65–99)
GLUCOSE-CAPILLARY: 134 mg/dL — AB (ref 65–99)
GLUCOSE-CAPILLARY: 150 mg/dL — AB (ref 65–99)
GLUCOSE-CAPILLARY: 258 mg/dL — AB (ref 65–99)
Glucose-Capillary: 225 mg/dL — ABNORMAL HIGH (ref 65–99)

## 2017-11-11 LAB — RPR: RPR Ser Ql: NONREACTIVE

## 2017-11-11 LAB — HOMOCYSTEINE: Homocysteine: 8.4 umol/L (ref 0.0–15.0)

## 2017-11-11 NOTE — [place] FL2 (Signed)
[place] MEDICAID FL2 LEVEL OF CARE SCREENING TOOL     IDENTIFICATION  Patient Name: Jacob Little Birthdate: [birth year] Sex: male Admission Date (Current Location): 11/08/2017  [facility] and [place] Number:  [employer] and Address:  [facility] [address], [place]      Provider Number: [id number]  Attending Physician Name and Address:  Barton Dubois, MD  Relative Name and Phone Number:  Melburn Popper [phone]    Current Level of Care: Hospital Recommended Level of Care: [place] Prior Approval Number:    Date Approved/Denied:   PASRR Number: [id number]  Discharge Plan: SNF    Current Diagnoses: Patient Active Problem List   Diagnosis Date Noted  . Acute embolic stroke ([place]) 84/69/6295  . Acute encephalopathy 11/08/2017  . Acute ischemic stroke ([place]) 11/08/2017  . Essential hypertension 11/08/2017  . Diabetes mellitus with hyperglycemia, without long-term current use of insulin ([place]) 11/08/2017  . Acute ischemic left MCA stroke ([place]) 11/08/2017  . DENTAL PAIN 05/12/2010  . CORONARY ATHEROSCLEROSIS NATIVE CORONARY ARTERY 07/22/2009  . DM 07/12/2009  . HYPERLIPIDEMIA 07/12/2009  . HYPERTENSION 07/12/2009  . ANGINA, ATYPICAL 07/12/2009    Orientation RESPIRATION BLADDER Height & Weight     Self, Situation, Place  Normal Continent Weight: 157 lb 10.1 oz (71.5 kg) Height:  5\' 7"  (170.2 cm)  BEHAVIORAL SYMPTOMS/MOOD NEUROLOGICAL BOWEL NUTRITION STATUS      Continent Diet(see dc summary)  AMBULATORY STATUS COMMUNICATION OF NEEDS Skin   Extensive Assist Verbally Normal                       Personal Care Assistance Level of Assistance  Bathing, Feeding, Dressing Bathing Assistance: Limited assistance Feeding assistance: Limited assistance Dressing Assistance: Limited assistance     Functional Limitations Info  Sight, Hearing, Speech Sight Info: Adequate Hearing Info: Adequate Speech  Info: Adequate    SPECIAL CARE FACTORS FREQUENCY  PT (By licensed PT), OT (By licensed OT)     PT Frequency: 5 times week OT Frequency: 3-5 times week            Contractures Contractures Info: Not present    Additional Factors Info  Code Status, Allergies Code Status Info: full Allergies Info: No Known Allergies           Current Medications (11/11/2017):  This is the current hospital active medication list Current Facility-Administered Medications  Medication Dose Route Frequency Provider Last Rate Last Dose  . 0.9 %  sodium chloride infusion   Intravenous Continuous Francine Graven, DO 100 mL/hr at 11/10/17 1156    . acetaminophen (TYLENOL) tablet 650 mg  650 mg Oral Q4H PRN Dhungel, Nishant, MD       Or  . acetaminophen (TYLENOL) solution 650 mg  650 mg Per Tube Q4H PRN Dhungel, Nishant, MD       Or  . acetaminophen (TYLENOL) suppository 650 mg  650 mg Rectal Q4H PRN Dhungel, Nishant, MD      . aspirin tablet 325 mg  325 mg Oral Daily Dhungel, Nishant, MD   325 mg at 11/11/17 1042  . divalproex (DEPAKOTE) DR tablet 250 mg  250 mg Oral Q8H [place], Kofi, MD   250 mg at 11/10/17 2150  . enoxaparin (LOVENOX) injection 40 mg  40 mg Subcutaneous Q24H Dhungel, Nishant, MD   40 mg at 11/10/17 1743  . insulin aspart (novoLOG) injection 0-9 Units  0-9 Units Subcutaneous Q4H Dhungel, Nishant, MD  1 Units at 11/11/17 0100  . isosorbide mononitrate (IMDUR) 24 hr tablet 30 mg  30 mg Oral Daily Barton Dubois, MD   30 mg at 11/11/17 1043  . metoprolol tartrate (LOPRESSOR) tablet 12.5 mg  12.5 mg Oral BID Barton Dubois, MD   12.5 mg at 11/11/17 1043  . QUEtiapine (SEROQUEL) tablet 12.5 mg  12.5 mg Oral BID Barton Dubois, MD   12.5 mg at 11/11/17 1042  . senna-docusate (Senokot-S) tablet 1 tablet  1 tablet Oral QHS PRN Dhungel, Nishant, MD         Discharge Medications: Please see discharge summary for a list of discharge medications.  Relevant Imaging Results:  Relevant  Lab Results:   Additional Information SSN: [id number]  Shade Flood, LCSW

## 2017-11-11 NOTE — Clinical Social Work Note (Signed)
LCSW following. Per PT, pt would benefit from SNF PT rehab along with OT. Discussed with pt's daughter who is in agreement. Referrals sent to facilities in [place] and [place]. Contacted pt's insurance to request authorization. RN CM there indicated pt would likely be reviewed by the medical director and it was unlikely there would be a determination today.  Pt's daughter indicated that if insurance denies SNF, she will likely have pt return home at dc as she cannot get the ALF situation arranged immediately. She also stated that she doesn't feel like she can place him in [place] so she would need to find a closer location that could take Medicaid. Daughter indicated that she would see if pt's brother can help at pt's home if that ends up being the plan.   Will follow.
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
## 2017-11-12 DIAGNOSIS — R269 Unspecified abnormalities of gait and mobility: Secondary | ICD-10-CM | POA: Diagnosis not present

## 2017-11-12 DIAGNOSIS — R1312 Dysphagia, oropharyngeal phase: Secondary | ICD-10-CM | POA: Diagnosis not present

## 2017-11-12 DIAGNOSIS — E1165 Type 2 diabetes mellitus with hyperglycemia: Secondary | ICD-10-CM | POA: Diagnosis not present

## 2017-11-12 DIAGNOSIS — F0151 Vascular dementia with behavioral disturbance: Secondary | ICD-10-CM

## 2017-11-12 DIAGNOSIS — I2510 Atherosclerotic heart disease of native coronary artery without angina pectoris: Secondary | ICD-10-CM | POA: Diagnosis not present

## 2017-11-12 DIAGNOSIS — E0865 Diabetes mellitus due to underlying condition with hyperglycemia: Secondary | ICD-10-CM | POA: Diagnosis not present

## 2017-11-12 DIAGNOSIS — I63412 Cerebral infarction due to embolism of left middle cerebral artery: Secondary | ICD-10-CM | POA: Diagnosis not present

## 2017-11-12 DIAGNOSIS — F01518 Vascular dementia, unspecified severity, with other behavioral disturbance: Secondary | ICD-10-CM

## 2017-11-12 DIAGNOSIS — M6281 Muscle weakness (generalized): Secondary | ICD-10-CM | POA: Diagnosis not present

## 2017-11-12 DIAGNOSIS — G9340 Encephalopathy, unspecified: Secondary | ICD-10-CM | POA: Diagnosis not present

## 2017-11-12 DIAGNOSIS — I10 Essential (primary) hypertension: Secondary | ICD-10-CM | POA: Diagnosis not present

## 2017-11-12 DIAGNOSIS — I63512 Cerebral infarction due to unspecified occlusion or stenosis of left middle cerebral artery: Secondary | ICD-10-CM | POA: Diagnosis not present

## 2017-11-12 DIAGNOSIS — R2681 Unsteadiness on feet: Secondary | ICD-10-CM | POA: Diagnosis not present

## 2017-11-12 DIAGNOSIS — E785 Hyperlipidemia, unspecified: Secondary | ICD-10-CM | POA: Diagnosis not present

## 2017-11-12 DIAGNOSIS — R41841 Cognitive communication deficit: Secondary | ICD-10-CM | POA: Diagnosis not present

## 2017-11-12 LAB — GLUCOSE, CAPILLARY: Glucose-Capillary: 151 mg/dL — ABNORMAL HIGH (ref 65–99)

## 2017-11-12 MED ORDER — QUETIAPINE FUMARATE 25 MG PO TABS: 12.5000 mg | ORAL_TABLET | Freq: Two times a day (BID) | ORAL | Status: DC

## 2017-11-12 MED ORDER — DIVALPROEX SODIUM 250 MG PO DR TAB: 250.0000 mg | DELAYED_RELEASE_TABLET | Freq: Three times a day (TID) | ORAL | Status: DC

## 2017-11-12 MED ORDER — ISOSORBIDE MONONITRATE ER 30 MG PO TB24: 30.0000 mg | ORAL_TABLET | Freq: Every day | ORAL | 3 refills | Status: DC

## 2017-11-12 MED ORDER — ACETAMINOPHEN 325 MG PO TABS: 650.0000 mg | ORAL_TABLET | ORAL | Status: AC | PRN

## 2017-11-12 MED ORDER — OZEMPIC (0.25 OR 0.5 MG/DOSE) 2 MG/1.5ML ~~LOC~~ SOPN: 0.2500 mg | PEN_INJECTOR | SUBCUTANEOUS | Status: DC

## 2017-11-12 MED ORDER — METOPROLOL TARTRATE 25 MG PO TABS: 12.5000 mg | ORAL_TABLET | Freq: Two times a day (BID) | ORAL | Status: AC

## 2017-11-12 NOTE — Progress Notes (Signed)
IV discontinued,catheter intact. Patient being discharged to [facility], attempted 7 times to call and give report,no success. [place] provided transportation to awaiting facility.

## 2017-11-12 NOTE — Care Management Important Message (Signed)
Important Message  Patient Details  Name: GABRIELL CASIMIR MRN: [id number] Date of Birth: [birth year]   Medicare Important Message Given:  Yes    Shelda Altes 11/12/2017, 12:40 PM

## 2017-11-12 NOTE — Clinical Social Work Placement (Signed)
   CLINICAL SOCIAL WORK PLACEMENT  NOTE  Date:  11/12/2017  Patient Details  Name: LOVELLE LEMA MRN: [id number] Date of Birth: [birth year]  Clinical Social Work is seeking post-discharge placement for this patient at the [place] level of care (*CSW will initial, date and re-position this form in  chart as items are completed):  Yes   Patient/family provided with [place] Work Department's list of facilities offering this level of care within the geographic area requested by the patient (or if unable, by the patient's family).  Yes   Patient/family informed of their freedom to choose among providers that offer the needed level of care, that participate in Medicare, Medicaid or managed care program needed by the patient, have an available bed and are willing to accept the patient.  Yes   Patient/family informed of [facility]'s ownership interest in [facility] and [facility], as well as of the fact that they are under no obligation to receive care at these facilities.  PASRR submitted to EDS on 11/11/17     PASRR number received on 11/11/17     Existing PASRR number confirmed on 11/11/17     FL2 transmitted to all facilities in geographic area requested by pt/family on 11/11/17     FL2 transmitted to all facilities within larger geographic area on       Patient informed that his/her managed care company has contracts with or will negotiate with certain facilities, including the following:        Yes   Patient/family informed of bed offers received.  Patient chooses bed at [facility]     Physician recommends and patient chooses bed at      Patient to be transferred to [facility] on 11/12/17.  Patient to be transferred to facility by [facility]     Patient family notified on 11/12/17 of transfer.  Name of family member notified:  [facility]     PHYSICIAN       Additional Comment: Pt authorized by insurance for  SNF rehab. Updated daughter, MD, and Gerald Stabs at [facility]. RN will call report. Transport to be arranged. DC clinical will be sent through the hub. No other LCSW needs for dc.   _______________________________________________ Shade Flood, LCSW 11/12/2017, 1:59 PM
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
## 2017-11-12 NOTE — Clinical Social Work Note (Signed)
LCSW following Spoke with Personnel officer and pt has not yet been reviewed. They are hopeful to have a decision later today but otherwise it will be tomorrow. Spoke with pt's daughter to update on bed offers and she would like for pt to go to [facility].   Spoke with Jerene Pitch at [employer] [phone] and was informed that pt does have [place] Medicaid and if he needs LTC, they can pursue a changeover. If pt needs ALF after rehab, daughter can initiate a Special Assistance application.  Updated daughter on above. Covering LCSW will follow for further assistance with dc planning.

## 2017-11-12 NOTE — Discharge Instructions (Signed)
Your Diabetes level also known as an A1c was 9.5% this admission. This is a measurement of your average "sugar" levels over the past 2-3 months.  Your A1c needs to be down around 7%, which is an average of 150 when you stick your finger to check your blood sugar.  You will possibly need another medication added by your primary care doctor to manage your diabetes if there is nothing you can change with your diet or being more physically active.

## 2017-11-12 NOTE — Progress Notes (Signed)
Inpatient Diabetes Program Recommendations  AACE/ADA: New Consensus Statement on Inpatient Glycemic Control (2015)  Target Ranges:  Prepandial:   less than 140 mg/dL      Peak postprandial:   less than 180 mg/dL (1-2 hours)      Critically ill patients:  140 - 180 mg/dL   Results for CONWAY, FEDORA (MRN [id number]) as of 11/12/2017 10:34  Ref. Range 11/11/2017 08:06 11/11/2017 12:00 11/11/2017 17:21 11/11/2017 21:04 11/12/2017 08:07  Glucose-Capillary Latest Ref Range: 65 - 99 mg/dL 150 (H) 225 (H) 117 (H) 258 (H) 151 (H)    Review of Glycemic Control  Diabetes history: DM 2 Outpatient Diabetes medications: Metformin 1,000 mg BID Current orders for Inpatient glycemic control: Novolog Sensitive Correction 0-9 units Q4 hours  Inpatient Diabetes Program Recommendations:    Consider Novolog Moderate Correction 0-15 units. Noted A1c 9.5% this admission, will possibly need another agent added by PCP for outpatient management.  Thanks,  Tama Headings RN, MSN, BC-ADM, [facility] Inpatient Diabetes Coordinator Team Pager [phone] (8a-5p)

## 2017-11-15 DIAGNOSIS — I2510 Atherosclerotic heart disease of native coronary artery without angina pectoris: Secondary | ICD-10-CM | POA: Diagnosis not present

## 2017-11-15 DIAGNOSIS — I10 Essential (primary) hypertension: Secondary | ICD-10-CM | POA: Diagnosis not present

## 2017-11-15 DIAGNOSIS — I63412 Cerebral infarction due to embolism of left middle cerebral artery: Secondary | ICD-10-CM | POA: Diagnosis not present

## 2017-11-15 DIAGNOSIS — E1165 Type 2 diabetes mellitus with hyperglycemia: Secondary | ICD-10-CM | POA: Diagnosis not present

## 2017-11-17 DIAGNOSIS — I10 Essential (primary) hypertension: Secondary | ICD-10-CM | POA: Diagnosis not present

## 2017-11-17 DIAGNOSIS — I2510 Atherosclerotic heart disease of native coronary artery without angina pectoris: Secondary | ICD-10-CM | POA: Diagnosis not present

## 2017-11-17 DIAGNOSIS — I63412 Cerebral infarction due to embolism of left middle cerebral artery: Secondary | ICD-10-CM | POA: Diagnosis not present

## 2017-11-17 DIAGNOSIS — E1165 Type 2 diabetes mellitus with hyperglycemia: Secondary | ICD-10-CM | POA: Diagnosis not present

## 2017-11-18 DIAGNOSIS — I63412 Cerebral infarction due to embolism of left middle cerebral artery: Secondary | ICD-10-CM | POA: Diagnosis not present

## 2017-11-18 DIAGNOSIS — I10 Essential (primary) hypertension: Secondary | ICD-10-CM | POA: Diagnosis not present

## 2017-11-18 DIAGNOSIS — I2510 Atherosclerotic heart disease of native coronary artery without angina pectoris: Secondary | ICD-10-CM | POA: Diagnosis not present

## 2017-11-18 DIAGNOSIS — E1165 Type 2 diabetes mellitus with hyperglycemia: Secondary | ICD-10-CM | POA: Diagnosis not present

## 2017-11-19 ENCOUNTER — Other Ambulatory Visit: Payer: Self-pay | Admitting: *Deleted

## 2017-11-19 DIAGNOSIS — E785 Hyperlipidemia, unspecified: Secondary | ICD-10-CM | POA: Diagnosis not present

## 2017-11-19 DIAGNOSIS — I700 Atherosclerosis of aorta: Secondary | ICD-10-CM | POA: Diagnosis not present

## 2017-11-19 DIAGNOSIS — M6281 Muscle weakness (generalized): Secondary | ICD-10-CM | POA: Diagnosis not present

## 2017-11-19 DIAGNOSIS — I25119 Atherosclerotic heart disease of native coronary artery with unspecified angina pectoris: Secondary | ICD-10-CM | POA: Diagnosis not present

## 2017-11-19 DIAGNOSIS — Z87891 Personal history of nicotine dependence: Secondary | ICD-10-CM | POA: Diagnosis not present

## 2017-11-19 DIAGNOSIS — E1165 Type 2 diabetes mellitus with hyperglycemia: Secondary | ICD-10-CM | POA: Diagnosis not present

## 2017-11-19 DIAGNOSIS — F0151 Vascular dementia with behavioral disturbance: Secondary | ICD-10-CM | POA: Diagnosis not present

## 2017-11-19 DIAGNOSIS — Z7984 Long term (current) use of oral hypoglycemic drugs: Secondary | ICD-10-CM | POA: Diagnosis not present

## 2017-11-19 DIAGNOSIS — Z7982 Long term (current) use of aspirin: Secondary | ICD-10-CM | POA: Diagnosis not present

## 2017-11-19 DIAGNOSIS — I119 Hypertensive heart disease without heart failure: Secondary | ICD-10-CM | POA: Diagnosis not present

## 2017-11-19 DIAGNOSIS — I69398 Other sequelae of cerebral infarction: Secondary | ICD-10-CM | POA: Diagnosis not present

## 2017-11-19 DIAGNOSIS — I69391 Dysphagia following cerebral infarction: Secondary | ICD-10-CM | POA: Diagnosis not present

## 2017-11-19 DIAGNOSIS — I771 Stricture of artery: Secondary | ICD-10-CM | POA: Diagnosis not present

## 2017-11-19 DIAGNOSIS — G9340 Encephalopathy, unspecified: Secondary | ICD-10-CM | POA: Diagnosis not present

## 2017-11-19 DIAGNOSIS — J439 Emphysema, unspecified: Secondary | ICD-10-CM | POA: Diagnosis not present

## 2017-11-19 NOTE — Patient Outreach (Signed)
[facility]) Care Management  11/19/2017  MATEUS REWERTS 01-24-1936 [id number]   Transition of Care Referral   Referral Date:  11/20/27  Referral Source: Insurance HTA referral  Date of Admission: admitted to hospital 11/08/17, d/c to snf on 11/12/17  Diagnosis: Acute ischemic Left MCA stroke  Date of Discharge: 11/18/17  Facility:  [facility]/[place]  Insurance: HTA  Outreach attempt # 1 to patient. No answer and unable to leave voicemail message.  Plan:  [facility] RN CM sent an unsuccessful outreach letter and scheduled this patient for another call attempt within 3 business days  Ashawna Hanback L. Lavina Hamman, RN, BSN, CCM [facility] Telephonic Care Management Care Coordinator Direct number [phone]  Main [facility] number [phone] Fax number [phone]

## 2017-11-22 ENCOUNTER — Other Ambulatory Visit: Payer: Self-pay | Admitting: *Deleted

## 2017-11-22 NOTE — Patient Outreach (Signed)
[place] [facility]) Care Management  11/22/2017  KANNON GRANDERSON 27-Feb-1936 [id number]   Transition of Care Referral  Referral Date:  11/20/27  Referral Source: Insurance HTA referral  Date of Admission: admitted to hospital 11/08/17, d/c to snf on 11/12/17  Diagnosis: Acute ischemic Left MCA stroke  Date of Discharge: 11/18/17  Facility:  [facility]/[place]  Insurance: HTA  Outreach attempt # 2 to patient. No answer and unable to leave voicemail message at the number listed in Epic as Mr [place] home and mobile number.  Plan:  [facility] RN CM scheduled this patient for another call attempt within 3 business days An unsuccessful outreach letter was sent on 11/19/17 CM made an attempt to call Dr Willey Blade office but unable to reach staff- busy x 2 and Cm unable to leave a message Cm returned a call to Dr Willey Blade after lunch hour and spoke with Kenney Houseman S who confirms pt is scheduled for appointment on November 26 2017 and she will speak with him at that time about [facility] services and encourage him to call [facility] CM at the number provided   Kimberly L. Lavina Hamman, RN, BSN, CCM [facility] Telephonic Care Management Care Coordinator Direct number [phone]  Main [facility] number [phone]

## 2017-11-25 ENCOUNTER — Other Ambulatory Visit: Payer: Self-pay | Admitting: *Deleted
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
## 2017-11-26 DIAGNOSIS — Z6830 Body mass index (BMI) 30.0-30.9, adult: Secondary | ICD-10-CM | POA: Diagnosis not present

## 2017-11-26 DIAGNOSIS — G40909 Epilepsy, unspecified, not intractable, without status epilepticus: Secondary | ICD-10-CM | POA: Diagnosis not present

## 2017-12-10 DIAGNOSIS — G4089 Other seizures: Secondary | ICD-10-CM | POA: Diagnosis not present

## 2017-12-10 DIAGNOSIS — R413 Other amnesia: Secondary | ICD-10-CM | POA: Diagnosis not present

## 2017-12-10 DIAGNOSIS — R197 Diarrhea, unspecified: Secondary | ICD-10-CM | POA: Diagnosis not present

## 2017-12-24 DIAGNOSIS — R197 Diarrhea, unspecified: Secondary | ICD-10-CM | POA: Diagnosis not present

## 2017-12-24 DIAGNOSIS — E119 Type 2 diabetes mellitus without complications: Secondary | ICD-10-CM | POA: Diagnosis not present

## 2018-01-05 ENCOUNTER — Encounter (HOSPITAL_COMMUNITY): Payer: Self-pay | Admitting: Emergency Medicine

## 2018-01-05 ENCOUNTER — Observation Stay (HOSPITAL_COMMUNITY): Payer: PPO

## 2018-01-05 ENCOUNTER — Observation Stay (HOSPITAL_COMMUNITY)
Admission: EM | Admit: 2018-01-05 | Discharge: 2018-01-07 | Disposition: A | Discharge status: Home / Self Care | Payer: PPO | Attending: Internal Medicine | Admitting: Internal Medicine

## 2018-01-05 ENCOUNTER — Other Ambulatory Visit: Payer: Self-pay

## 2018-01-05 ENCOUNTER — Emergency Department (HOSPITAL_COMMUNITY): Payer: PPO

## 2018-01-05 DIAGNOSIS — E0865 Diabetes mellitus due to underlying condition with hyperglycemia: Secondary | ICD-10-CM

## 2018-01-05 DIAGNOSIS — I10 Essential (primary) hypertension: Secondary | ICD-10-CM | POA: Diagnosis not present

## 2018-01-05 DIAGNOSIS — Z79899 Other long term (current) drug therapy: Secondary | ICD-10-CM | POA: Diagnosis not present

## 2018-01-05 DIAGNOSIS — F0151 Vascular dementia with behavioral disturbance: Secondary | ICD-10-CM | POA: Insufficient documentation

## 2018-01-05 DIAGNOSIS — N183 Chronic kidney disease, stage 3 unspecified: Secondary | ICD-10-CM | POA: Diagnosis present

## 2018-01-05 DIAGNOSIS — E860 Dehydration: Secondary | ICD-10-CM | POA: Diagnosis not present

## 2018-01-05 DIAGNOSIS — R197 Diarrhea, unspecified: Secondary | ICD-10-CM | POA: Diagnosis not present

## 2018-01-05 DIAGNOSIS — F0390 Unspecified dementia without behavioral disturbance: Secondary | ICD-10-CM | POA: Diagnosis not present

## 2018-01-05 DIAGNOSIS — E785 Hyperlipidemia, unspecified: Secondary | ICD-10-CM | POA: Diagnosis not present

## 2018-01-05 DIAGNOSIS — I2510 Atherosclerotic heart disease of native coronary artery without angina pectoris: Secondary | ICD-10-CM | POA: Diagnosis not present

## 2018-01-05 DIAGNOSIS — Z955 Presence of coronary angioplasty implant and graft: Secondary | ICD-10-CM | POA: Insufficient documentation

## 2018-01-05 DIAGNOSIS — E1165 Type 2 diabetes mellitus with hyperglycemia: Secondary | ICD-10-CM | POA: Diagnosis not present

## 2018-01-05 DIAGNOSIS — R0902 Hypoxemia: Secondary | ICD-10-CM | POA: Diagnosis not present

## 2018-01-05 DIAGNOSIS — N179 Acute kidney failure, unspecified: Secondary | ICD-10-CM | POA: Diagnosis not present

## 2018-01-05 DIAGNOSIS — I129 Hypertensive chronic kidney disease with stage 1 through stage 4 chronic kidney disease, or unspecified chronic kidney disease: Secondary | ICD-10-CM | POA: Insufficient documentation

## 2018-01-05 DIAGNOSIS — Z87891 Personal history of nicotine dependence: Secondary | ICD-10-CM | POA: Diagnosis not present

## 2018-01-05 DIAGNOSIS — K625 Hemorrhage of anus and rectum: Secondary | ICD-10-CM | POA: Diagnosis present

## 2018-01-05 DIAGNOSIS — E872 Acidosis, unspecified: Secondary | ICD-10-CM | POA: Diagnosis present

## 2018-01-05 DIAGNOSIS — E119 Type 2 diabetes mellitus without complications: Secondary | ICD-10-CM | POA: Diagnosis not present

## 2018-01-05 DIAGNOSIS — Z7982 Long term (current) use of aspirin: Secondary | ICD-10-CM | POA: Diagnosis not present

## 2018-01-05 DIAGNOSIS — I951 Orthostatic hypotension: Secondary | ICD-10-CM

## 2018-01-05 DIAGNOSIS — F01518 Vascular dementia, unspecified severity, with other behavioral disturbance: Secondary | ICD-10-CM | POA: Diagnosis present

## 2018-01-05 DIAGNOSIS — R531 Weakness: Principal | ICD-10-CM

## 2018-01-05 HISTORY — DX: Cerebral infarction, unspecified: I63.9

## 2018-01-05 LAB — URINALYSIS, ROUTINE W REFLEX MICROSCOPIC
Bilirubin Urine: NEGATIVE
GLUCOSE, UA: NEGATIVE mg/dL
Hgb urine dipstick: NEGATIVE
KETONES UR: 5 mg/dL — AB
Nitrite: NEGATIVE
PH: 5 (ref 5.0–8.0)
Protein, ur: NEGATIVE mg/dL
Specific Gravity, Urine: 1.023 (ref 1.005–1.030)

## 2018-01-05 LAB — CBC
HEMATOCRIT: 35.1 % — AB (ref 39.0–52.0)
Hemoglobin: 11.6 g/dL — ABNORMAL LOW (ref 13.0–17.0)
MCH: 30.4 pg (ref 26.0–34.0)
MCHC: 33 g/dL (ref 30.0–36.0)
MCV: 91.9 fL (ref 78.0–100.0)
Platelets: 318 10*3/uL (ref 150–400)
RBC: 3.82 MIL/uL — ABNORMAL LOW (ref 4.22–5.81)
RDW: 14 % (ref 11.5–15.5)
WBC: 14.2 10*3/uL — ABNORMAL HIGH (ref 4.0–10.5)

## 2018-01-05 LAB — TROPONIN I: Troponin I: <0.03 ng/mL (ref <0.03)

## 2018-01-05 LAB — TYPE AND SCREEN
ABO/RH(D): O POS
Antibody Screen: NEGATIVE

## 2018-01-05 LAB — COMPREHENSIVE METABOLIC PANEL
ALBUMIN: 2.9 g/dL — AB (ref 3.5–5.0)
ALT: 18 U/L (ref 0–44)
AST: 18 U/L (ref 15–41)
Alkaline Phosphatase: 46 U/L (ref 38–126)
Anion gap: 9 (ref 5–15)
BUN: 22 mg/dL (ref 8–23)
CHLORIDE: 102 mmol/L (ref 98–111)
CO2: 24 mmol/L (ref 22–32)
Calcium: 8.6 mg/dL — ABNORMAL LOW (ref 8.9–10.3)
Creatinine, Ser: 1.27 mg/dL — ABNORMAL HIGH (ref 0.61–1.24)
GFR calc Af Amer: 60 mL/min — ABNORMAL LOW (ref >60)
GFR calc non Af Amer: 52 mL/min — ABNORMAL LOW (ref >60)
GLUCOSE: 161 mg/dL — AB (ref 70–99)
POTASSIUM: 4.6 mmol/L (ref 3.5–5.1)
Sodium: 135 mmol/L (ref 135–145)
Total Bilirubin: 0.8 mg/dL (ref 0.3–1.2)
Total Protein: 6.5 g/dL (ref 6.5–8.1)

## 2018-01-05 LAB — GLUCOSE, CAPILLARY
GLUCOSE-CAPILLARY: 152 mg/dL — AB (ref 70–99)
GLUCOSE-CAPILLARY: 154 mg/dL — AB (ref 70–99)

## 2018-01-05 LAB — MAGNESIUM: Magnesium: 1.3 mg/dL — ABNORMAL LOW (ref 1.7–2.4)

## 2018-01-05 LAB — I-STAT CG4 LACTIC ACID, ED: Lactic Acid, Venous: 2.86 mmol/L (ref 0.5–1.9)

## 2018-01-05 LAB — POC OCCULT BLOOD, ED: Fecal Occult Bld: NEGATIVE

## 2018-01-05 LAB — TSH: TSH: 3.451 u[IU]/mL (ref 0.350–4.500)

## 2018-01-05 LAB — PHOSPHORUS: PHOSPHORUS: 2.4 mg/dL — AB (ref 2.5–4.6)

## 2018-01-05 MED ORDER — SODIUM CHLORIDE 0.9 % IV BOLUS
500.0000 mL | Freq: Once | INTRAVENOUS | Status: AC
  Administered 2018-01-05: 500 mL via INTRAVENOUS

## 2018-01-05 MED ORDER — DIVALPROEX SODIUM 250 MG PO DR TAB
250.0000 mg | DELAYED_RELEASE_TABLET | Freq: Three times a day (TID) | ORAL | Status: DC
  Administered 2018-01-05 – 2018-01-07 (×6): 250 mg via ORAL
  Filled 2018-01-05 (×6): qty 1

## 2018-01-05 MED ORDER — ASPIRIN EC 325 MG PO TBEC
325.0000 mg | DELAYED_RELEASE_TABLET | Freq: Every day | ORAL | Status: DC
  Administered 2018-01-06 – 2018-01-07 (×2): 325 mg via ORAL
  Filled 2018-01-05 (×2): qty 1

## 2018-01-05 MED ORDER — ONDANSETRON HCL 4 MG PO TABS: 4.0000 mg | ORAL_TABLET | Freq: Four times a day (QID) | ORAL | Status: DC | PRN

## 2018-01-05 MED ORDER — PANTOPRAZOLE SODIUM 40 MG PO TBEC
40.0000 mg | DELAYED_RELEASE_TABLET | Freq: Every day | ORAL | Status: DC
  Administered 2018-01-06 – 2018-01-07 (×2): 40 mg via ORAL
  Filled 2018-01-05 (×2): qty 1

## 2018-01-05 MED ORDER — ISOSORBIDE MONONITRATE ER 30 MG PO TB24
15.0000 mg | ORAL_TABLET | Freq: Two times a day (BID) | ORAL | Status: DC
  Administered 2018-01-05 – 2018-01-07 (×4): 15 mg via ORAL
  Filled 2018-01-05 (×4): qty 1

## 2018-01-05 MED ORDER — MAGNESIUM SULFATE 2 GM/50ML IV SOLN
2.0000 g | Freq: Once | INTRAVENOUS | Status: AC
  Administered 2018-01-05: 2 g via INTRAVENOUS
  Filled 2018-01-05: qty 50

## 2018-01-05 MED ORDER — ATORVASTATIN CALCIUM 20 MG PO TABS
20.0000 mg | ORAL_TABLET | Freq: Every day | ORAL | Status: DC
  Administered 2018-01-05 – 2018-01-07 (×3): 20 mg via ORAL
  Filled 2018-01-05 (×3): qty 1

## 2018-01-05 MED ORDER — INSULIN ASPART 100 UNIT/ML ~~LOC~~ SOLN
0.0000 [IU] | Freq: Three times a day (TID) | SUBCUTANEOUS | Status: DC
  Administered 2018-01-06: 2 [IU] via SUBCUTANEOUS

## 2018-01-05 MED ORDER — METOPROLOL TARTRATE 25 MG PO TABS
12.5000 mg | ORAL_TABLET | Freq: Two times a day (BID) | ORAL | Status: DC
  Administered 2018-01-05 – 2018-01-07 (×4): 12.5 mg via ORAL
  Filled 2018-01-05 (×4): qty 1

## 2018-01-05 MED ORDER — CYANOCOBALAMIN 1000 MCG/ML IJ SOLN: 1000.0000 ug | INTRAMUSCULAR | Status: DC

## 2018-01-05 MED ORDER — ACETAMINOPHEN 325 MG PO TABS
650.0000 mg | ORAL_TABLET | Freq: Four times a day (QID) | ORAL | Status: DC | PRN
Start: 2018-01-05 — End: 2018-01-07

## 2018-01-05 MED ORDER — ONDANSETRON HCL 4 MG/2ML IJ SOLN: 4.0000 mg | Freq: Four times a day (QID) | INTRAMUSCULAR | Status: DC | PRN

## 2018-01-05 MED ORDER — SODIUM CHLORIDE 0.9 % IV BOLUS
500.0000 mL | Freq: Once | INTRAVENOUS | Status: AC
Start: 2018-01-05 — End: 2018-01-05
  Administered 2018-01-05: 500 mL via INTRAVENOUS

## 2018-01-05 MED ORDER — SODIUM CHLORIDE 0.9 % IV SOLN
INTRAVENOUS | Status: DC
Start: 2018-01-05 — End: 2018-01-07
  Administered 2018-01-05 – 2018-01-07 (×4): via INTRAVENOUS

## 2018-01-05 NOTE — ED Notes (Signed)
Critical I-STAT Lab Value   Lactic Acid 2.86 mmol/L   01/05/2018 1119  MD Notified - Dr. Lita Mains, MD

## 2018-01-05 NOTE — ED Triage Notes (Signed)
Pt reports diarrhea since yesterday. Denies other symptoms besides weakness. Per caregiver normally up and walking but today to tired.

## 2018-01-05 NOTE — H&P (Signed)
History and Physical    Jacob Little PFX:[id number] DOB: [birth year] DOA: 01/05/2018  Referring MD/NP/PA: Dr. Lita Mains PCP: [name], MD  Patient coming from: SNF  Chief Complaint: generalized weakness, black stools at facility and decrease verbal interaction.  HPI: AMAIR SHROUT is a 82 y.o. male with PMH significant for hx of prior stroke, HTN, HLD, type 2 diabetes with renal manifestation, CKD stage 3 and CAD; who presented to ED from nursing home due to generalized weakness, inability to ambulate today and decrease verbal interaction. There is description of intermittent diarrhea and black stools.  Patient denies CP, nausea, vomiting, abd pain, SOB, HA's, hematemesis and dysuria.   Level 5 caveat present as patient had hx of underlying dementia and is failing to properly communicate or interact with [place].  In ED work up demonstrated elevated WBC's, increase renal function from baseline, Hgb of 11.3; neg UA and neg CXR. CT head w/o acute intracranial abnormalities. FOBT done while in ED neg.  Past Medical/Surgical History: Past Medical History:  Diagnosis Date  . Coronary artery disease    DES to circumflex 2006  . Essential hypertension   . Hyperlipidemia   . Rheumatic fever   . Stroke ([place])   . Type 2 diabetes mellitus ([place])     Past Surgical History:  Procedure Laterality Date  . APPENDECTOMY    . CORONARY ANGIOPLASTY WITH STENT PLACEMENT      Social History:  reports that he quit smoking about 12 years ago. His smoking use included cigarettes. He has never used smokeless tobacco. He reports that he does not drink alcohol or use drugs.  Allergies: No Known Allergies  Family History:  Patient unable to assist with it given underlying hx of dementia. Family at bedside (his sister) expressed hx of hypothyroidism, B12 deficiency and CVA's on mother side. Also with HTN and HLD on his father side.  Prior to Admission medications   Medication Sig Start Date End Date  Taking? Authorizing Provider  acetaminophen (TYLENOL) 325 MG tablet Take 2 tablets (650 mg total) by mouth every 4 (four) hours as needed for mild pain (or temp > 37.5 C (99.5 F)). 11/12/17  Yes Barton Dubois, MD  aspirin EC 325 MG tablet Take 325 mg by mouth daily.   Yes [provider]  atorvastatin (LIPITOR) 20 MG tablet Take 20 mg by mouth daily.   Yes [provider]  cyanocobalamin (,VITAMIN B-12,) 1000 MCG/ML injection Inject 1,000 mcg into the muscle every 30 (thirty) days.  09/14/17  Yes [provider]  divalproex (DEPAKOTE) 250 MG DR tablet Take 1 tablet (250 mg total) by mouth every 8 (eight) hours. 11/12/17  Yes Barton Dubois, MD  isosorbide mononitrate (IMDUR) 30 MG 24 hr tablet Take 1 tablet (30 mg total) by mouth daily. 11/12/17 02/10/18 Yes Barton Dubois, MD  metFORMIN (GLUCOPHAGE) 500 MG tablet Take 500 mg by mouth 2 (two) times daily with a meal.   Yes [provider]  metoprolol tartrate (LOPRESSOR) 25 MG tablet Take 0.5 tablets (12.5 mg total) by mouth 2 (two) times daily. 11/12/17  Yes Barton Dubois, MD  nitroGLYCERIN (NITROSTAT) 0.4 MG SL tablet Place 0.4 mg under the tongue every 5 (five) minutes as needed for chest pain.   Yes [provider]  isosorbide mononitrate (IMDUR) 30 MG 24 hr tablet Take 1 tablet (30 mg total) by mouth 2 (two) times daily. 12/24/16 11/08/17  Satira Sark, MD  OZEMPIC 0.25 or 0.5 MG/DOSE SOPN Inject 0.25  mg into the skin once a week. 11/12/17   Barton Dubois, MD    Review of Systems:  Except for feeling weak and according to patient's family being less interactive. Otherwise neg except as mentioned on HPI.   Physical Exam: Vitals:   01/05/18 1446 01/05/18 1600 01/05/18 1731 01/05/18 1748  BP:  (!) 158/72    Pulse:  (!) 101    Resp:  16    Temp: 98.5 F (36.9 C) 97.7 F (36.5 C)    TempSrc: Oral Oral    SpO2:  100%    Weight:   67.4 kg (148 lb 9.4 oz)   Height:    5\' 9"  (1.753 m)     Constitutional: NAD, calm, and denying CP or SOB.patient is afebrile. Eyes: PERRL, lids and conjunctivae normal, no icterus, no nystagmus. ENMT: Mucous membranes are moist. Posterior pharynx clear of any exudate or lesions. No trush   Neck: normal, supple, no masses, no thyromegaly Respiratory: clear to auscultation bilaterally, no wheezing, no crackles. Normal respiratory effort. No accessory muscle use.  Cardiovascular: Regular rate and rhythm, no murmurs / rubs / gallops. No extremity edema. 2+ pedal pulses. No carotid bruits.  Abdomen: no tenderness, no masses palpated. No hepatosplenomegaly. Bowel sounds positive.  Musculoskeletal: no clubbing / cyanosis. No joint deformity upper and lower extremities. Good ROM, no contractures. Normal muscle tone.  Skin: no rashes, lesions, or open ulcers. Neurologic: CN 2-12 grossly intact. No pronation drift; positive decrease MS in the setting of poor effort.   Labs on Admission: I have personally reviewed the following labs and imaging studies  CBC: Recent Labs  Lab 01/05/18 1047  WBC 14.2*  HGB 11.6*  HCT 35.1*  MCV 91.9  PLT 102   Basic Metabolic Panel: Recent Labs  Lab 01/05/18 1047 01/05/18 1049  NA 135  --   K 4.6  --   CL 102  --   CO2 24  --   GLUCOSE 161*  --   BUN 22  --   CREATININE 1.27*  --   CALCIUM 8.6*  --   MG  --  1.3*   GFR: Estimated Creatinine Clearance: 44.2 mL/min (A) (by C-G formula based on SCr of 1.27 mg/dL (H)).   Liver Function Tests: Recent Labs  Lab 01/05/18 1047  AST 18  ALT 18  ALKPHOS 46  BILITOT 0.8  PROT 6.5  ALBUMIN 2.9*   No results for input(s): LIPASE, AMYLASE in the last 168 hours. No results for input(s): AMMONIA in the last 168 hours. Coagulation Profile: No results for input(s): INR, PROTIME in the last 168 hours. Cardiac Enzymes: Recent Labs  Lab 01/05/18 1113  TROPONINI <0.03   BHbA1C: Thyroid Function Tests: Recent Labs    01/05/18 1347  TSH 3.451    Urine analysis:    Component Value Date/Time   COLORURINE AMBER (A) 01/05/2018 1635   APPEARANCEUR HAZY (A) 01/05/2018 1635   LABSPEC 1.023 01/05/2018 1635   PHURINE 5.0 01/05/2018 1635   GLUCOSEU NEGATIVE 01/05/2018 1635   HGBUR NEGATIVE 01/05/2018 1635   BILIRUBINUR NEGATIVE 01/05/2018 1635   KETONESUR 5 (A) 01/05/2018 1635   PROTEINUR NEGATIVE 01/05/2018 1635   NITRITE NEGATIVE 01/05/2018 1635   LEUKOCYTESUR SMALL (A) 01/05/2018 1635    Radiological Exams on Admission: Dg Chest Port 1 View  Result Date: 01/05/2018 CLINICAL DATA:  Former smoker presenting with fatigue, diarrhea and hypoxia. Current history of hypertension and diabetes. Former smoker. EXAM: PORTABLE CHEST 1 VIEW COMPARISON:  11/08/2017,  11/23/2016 and earlier. FINDINGS: Cardiac silhouette upper normal in size for AP technique, unchanged. Thoracic aorta atherosclerotic, unchanged. Prominent central pulmonary arteries, unchanged. Mildly prominent bronchovascular markings diffusely and moderate central peribronchial thickening, unchanged. No new pulmonary parenchymal abnormalities. No visible pleural effusions. IMPRESSION: 1.  No acute cardiopulmonary disease. 2. Stable moderate changes of chronic bronchitis and/or asthma. Electronically Signed   By: Evangeline Dakin M.D.   On: 01/05/2018 12:00    EKG: Independently reviewed. No signs of acute ischemia.  Assessment/Plan 1-Generalized weakness -in patient with underlying dementia and prior stroke. -most likely deconditioning -will check CT head, B12 and TSH -PT eval and treatment in am -will provide IVF's resuscitation -replete electrolytes as needed. -encourage to follow increase PO intake.  2-HLD (hyperlipidemia) -holding statins for now -check lipid panel and follow LFT's  3-Essential hypertension -patient with soft BP and orthostatic changes on admission  -will cut imdur in half -give IVF's as mentioned above -continue metoprolol -follow orthostatic VS  in am   4-Diabetes mellitus with hyperglycemia, without long-term current use of insulin (HCC) -holding oral hypoglycemic agents while inpatient -check SSI -follow CBG's and adjust regimen as needed  -will check A1C  5-Vascular dementia with behavior disturbance -provide supportive care -resume home medications   6-Acute renal failure superimposed on stage 3 chronic kidney disease ([place]) -appears to be pre-renal in nature -family expressed patient no eating or drinking well lately at his facility (apparently he doesn't like the food) -will provide IVF -follow clinical response  -repeat BMET in am  7-Lactic acidosis and Diarrhea -no further BM's currently  continue IVF's -follow trend    DVT prophylaxis: SCD's. Code Status: [place] Family Communication: daughter and sister at bedside. Disposition Plan: plan is to provide supportive care and IVF's.  Returning back to SNF in the next 24-48 hours. Consults called: none  Admission status: observation  LOS < 2 midnights.   Time Spent: 70 minutes  Barton Dubois MD [facility] Pager [phone]  If 7PM-7AM, please contact night-coverage [email or web address] Password [facility]  01/05/2018, 6:48 PM

## 2018-01-05 NOTE — ED Provider Notes (Signed)
[facility] EMERGENCY DEPARTMENT Provider Note   CSN: [id number] Arrival date & time: 01/05/18  4970     History   Chief Complaint Chief Complaint  Patient presents with  . Rectal Bleeding    HPI Jacob Little is a 82 y.o. male.  HPI Patient has history of dementia and is not able to contribute to history.  Level 5 caveat applies.  Per EMS patient was generally weak and unable to walk.  Was also believed to be passing dark stool per nursing home staff. Past Medical History:  Diagnosis Date  . Coronary artery disease    DES to circumflex 2006  . Essential hypertension   . Hyperlipidemia   . Rheumatic fever   . Stroke ([place])   . Type 2 diabetes mellitus [facility])     Patient Active Problem List   Diagnosis Date Noted  . Generalized weakness 01/05/2018  . Acute renal failure superimposed on stage 3 chronic kidney disease ([place]) 01/05/2018  . Lactic acidosis 01/05/2018  . Diarrhea 01/05/2018  . Vascular dementia with behavior disturbance   . Acute embolic stroke ([place]) 26/37/8588  . Acute encephalopathy 11/08/2017  . Acute ischemic stroke ([place]) 11/08/2017  . Essential hypertension 11/08/2017  . Diabetes mellitus with hyperglycemia, without long-term current use of insulin ([place]) 11/08/2017  . Acute ischemic left MCA stroke ([place]) 11/08/2017  . DENTAL PAIN 05/12/2010  . CORONARY ATHEROSCLEROSIS NATIVE CORONARY ARTERY 07/22/2009  . DM 07/12/2009  . HLD (hyperlipidemia) 07/12/2009  . HYPERTENSION 07/12/2009  . ANGINA, ATYPICAL 07/12/2009    Past Surgical History:  Procedure Laterality Date  . APPENDECTOMY    . CORONARY ANGIOPLASTY WITH STENT PLACEMENT          Home Medications    Prior to Admission medications   Medication Sig Start Date End Date Taking? Authorizing Provider  acetaminophen (TYLENOL) 325 MG tablet Take 2 tablets (650 mg total) by mouth every 4 (four) hours as needed for mild pain (or temp > 37.5 C (99.5 F)). 11/12/17  Yes Barton Dubois, MD  aspirin  EC 325 MG tablet Take 325 mg by mouth daily.   Yes [provider]  atorvastatin (LIPITOR) 20 MG tablet Take 20 mg by mouth daily.   Yes [provider]  cyanocobalamin (,VITAMIN B-12,) 1000 MCG/ML injection Inject 1,000 mcg into the muscle every 30 (thirty) days.  09/14/17  Yes [provider]  divalproex (DEPAKOTE) 250 MG DR tablet Take 1 tablet (250 mg total) by mouth every 8 (eight) hours. 11/12/17  Yes Barton Dubois, MD  isosorbide mononitrate (IMDUR) 30 MG 24 hr tablet Take 1 tablet (30 mg total) by mouth daily. 11/12/17 02/10/18 Yes Barton Dubois, MD  metFORMIN (GLUCOPHAGE) 500 MG tablet Take 500 mg by mouth 2 (two) times daily with a meal.   Yes [provider]  metoprolol tartrate (LOPRESSOR) 25 MG tablet Take 0.5 tablets (12.5 mg total) by mouth 2 (two) times daily. 11/12/17  Yes Barton Dubois, MD  nitroGLYCERIN (NITROSTAT) 0.4 MG SL tablet Place 0.4 mg under the tongue every 5 (five) minutes as needed for chest pain.   Yes [provider]  isosorbide mononitrate (IMDUR) 30 MG 24 hr tablet Take 1 tablet (30 mg total) by mouth 2 (two) times daily. 12/24/16 11/08/17  Satira Sark, MD  OZEMPIC 0.25 or 0.5 MG/DOSE SOPN Inject 0.25 mg into the skin once a week. 11/12/17   Barton Dubois, MD    Family History History reviewed. No pertinent family history.  Social  History Social History   Tobacco Use  . Smoking status: Former Smoker    Types: Cigarettes    Last attempt to quit: 06/15/2005    Years since quitting: 12.5  . Smokeless tobacco: Never Used  Substance Use Topics  . Alcohol use: No    Alcohol/week: 0.0 oz  . Drug use: No     Allergies   Patient has no known allergies.   Review of Systems Review of Systems  Unable to perform ROS: Dementia     Physical Exam Updated Vital Signs BP 125/65 (BP Location: Left Arm)   Pulse 100   Temp 99 F (37.2 C) (Oral)   Resp 19   Ht 5\' 9"  (1.753 m)   Wt 67.4 kg (148 lb 9.4 oz)    SpO2 93%   BMI 21.94 kg/m   Physical Exam  Constitutional: He is oriented to person, place, and time. He appears well-developed and well-nourished. No distress.  HENT:  Head: Normocephalic and atraumatic.  Mouth/Throat: Oropharynx is clear and moist. No oropharyngeal exudate.  Eyes: Pupils are equal, round, and reactive to light. EOM are normal.  Neck: Normal range of motion. Neck supple. No JVD present.  Cardiovascular: Normal rate and regular rhythm. Exam reveals no gallop and no friction rub.  No murmur heard. Pulmonary/Chest: Effort normal and breath sounds normal. No stridor. No respiratory distress. He has no wheezes. He has no rales. He exhibits no tenderness.  Abdominal: Soft. Bowel sounds are normal. There is no tenderness. There is no rebound and no guarding.  Genitourinary:  Genitourinary Comments: Brown stool in rectum.  Guaiac negative.  Musculoskeletal: Normal range of motion. He exhibits no edema or tenderness.  No midline thoracic or lumbar tenderness.  No CVA tenderness.  No lower extremity swelling, asymmetry or tenderness.  Distal pulses intact.  Lymphadenopathy:    He has no cervical adenopathy.  Neurological: He is alert and oriented to person, place, and time.  Moves all extremities without focal deficit.  Sensation intact.  Following commands.  Mild confusion.  Skin: Skin is warm and dry. Capillary refill takes less than 2 seconds. No rash noted. No erythema.  Psychiatric: He has a normal mood and affect. His behavior is normal.  Nursing note and vitals reviewed.    ED Treatments / Results  Labs (all labs ordered are listed, but only abnormal results are displayed) Labs Reviewed  COMPREHENSIVE METABOLIC PANEL - Abnormal; Notable for the following components:      Result Value   Glucose, Bld 161 (*)    Creatinine, Ser 1.27 (*)    Calcium 8.6 (*)    Albumin 2.9 (*)    GFR calc non Af Amer 52 (*)    GFR calc Af Amer 60 (*)    All other components within  normal limits  CBC - Abnormal; Notable for the following components:   WBC 14.2 (*)    RBC 3.82 (*)    Hemoglobin 11.6 (*)    HCT 35.1 (*)    All other components within normal limits  URINALYSIS, ROUTINE W REFLEX MICROSCOPIC - Abnormal; Notable for the following components:   Color, Urine AMBER (*)    APPearance HAZY (*)    Ketones, ur 5 (*)    Leukocytes, UA SMALL (*)    Bacteria, UA RARE (*)    All other components within normal limits  MAGNESIUM - Abnormal; Notable for the following components:   Magnesium 1.3 (*)    All other components within normal  limits  CBC - Abnormal; Notable for the following components:   RBC 3.35 (*)    Hemoglobin 10.2 (*)    HCT 30.5 (*)    All other components within normal limits  BASIC METABOLIC PANEL - Abnormal; Notable for the following components:   Glucose, Bld 114 (*)    Calcium 8.0 (*)    All other components within normal limits  PHOSPHORUS - Abnormal; Notable for the following components:   Phosphorus 2.4 (*)    All other components within normal limits  GLUCOSE, CAPILLARY - Abnormal; Notable for the following components:   Glucose-Capillary 154 (*)    All other components within normal limits  GLUCOSE, CAPILLARY - Abnormal; Notable for the following components:   Glucose-Capillary 152 (*)    All other components within normal limits  I-STAT CG4 LACTIC ACID, ED - Abnormal; Notable for the following components:   Lactic Acid, Venous 2.86 (*)    All other components within normal limits  TROPONIN I  VITAMIN B12  TSH  LACTIC ACID, PLASMA  MAGNESIUM  HEMOGLOBIN A1C  POC OCCULT BLOOD, ED  TYPE AND SCREEN    EKG EKG Interpretation  Date/Time:  Wednesday January 05 2018 10:38:55 EDT Ventricular Rate:  83 PR Interval:    QRS Duration: 136 QT Interval:  384 QTC Calculation: 452 R Axis:   -86 Text Interpretation:  Sinus rhythm RBBB and LAFB No significant change since last tracing 08 Nov 2017 Confirmed by Rolland Porter [phone]) on  01/06/2018 3:34:22 AM   Radiology Ct Head Wo Contrast  Result Date: 01/05/2018 CLINICAL DATA:  Diarrhea since yesterday, weakness. History of stroke. EXAM: CT HEAD WITHOUT CONTRAST TECHNIQUE: Contiguous axial images were obtained from the base of the skull through the vertex without intravenous contrast. COMPARISON:  Head CT dated 11/08/2017.  Brain MRI dated 11/09/2017. FINDINGS: Brain: Generalized age related volume loss with commensurate dilatation of the ventricles and sulci. Chronic small vessel ischemic changes are again noted within the bilateral periventricular and subcortical white matter regions. No mass, hemorrhage, edema or other evidence of acute parenchymal abnormality. No extra-axial hemorrhage. Vascular: There are chronic calcified atherosclerotic changes of the large vessels at the skull base. No unexpected hyperdense vessel. Skull: Normal. Negative for fracture or focal lesion. Sinuses/Orbits: No acute finding. Other: None. IMPRESSION: 1. No acute findings.  No intracranial mass, hemorrhage or edema. 2. Chronic small vessel ischemic changes within the white matter. Electronically Signed   By: Franki Cabot M.D.   On: 01/05/2018 21:24   Dg Chest Port 1 View  Result Date: 01/05/2018 CLINICAL DATA:  Former smoker presenting with fatigue, diarrhea and hypoxia. Current history of hypertension and diabetes. Former smoker. EXAM: PORTABLE CHEST 1 VIEW COMPARISON:  11/08/2017, 11/23/2016 and earlier. FINDINGS: Cardiac silhouette upper normal in size for AP technique, unchanged. Thoracic aorta atherosclerotic, unchanged. Prominent central pulmonary arteries, unchanged. Mildly prominent bronchovascular markings diffusely and moderate central peribronchial thickening, unchanged. No new pulmonary parenchymal abnormalities. No visible pleural effusions. IMPRESSION: 1.  No acute cardiopulmonary disease. 2. Stable moderate changes of chronic bronchitis and/or asthma. Electronically Signed   By: Evangeline Dakin M.D.   On: 01/05/2018 12:00    Procedures Procedures (including critical care time)  Medications Ordered in ED Medications  0.9 %  sodium chloride infusion ( Intravenous New Bag/Given 01/06/18 0525)  acetaminophen (TYLENOL) tablet 650 mg (has no administration in time range)  aspirin EC tablet 325 mg (has no administration in time range)  atorvastatin (LIPITOR) tablet 20 mg (  20 mg Oral Given 01/05/18 2209)  cyanocobalamin ((VITAMIN B-12)) injection 1,000 mcg (1,000 mcg Intramuscular Not Given 01/05/18 2343)  divalproex (DEPAKOTE) DR tablet 250 mg (250 mg Oral Given 01/06/18 0523)  metoprolol tartrate (LOPRESSOR) tablet 12.5 mg (12.5 mg Oral Given 01/05/18 2153)  isosorbide mononitrate (IMDUR) 24 hr tablet 15 mg (15 mg Oral Given 01/05/18 2153)  ondansetron (ZOFRAN) tablet 4 mg (has no administration in time range)    Or  ondansetron (ZOFRAN) injection 4 mg (has no administration in time range)  pantoprazole (PROTONIX) EC tablet 40 mg (40 mg Oral Not Given 01/05/18 2153)  insulin aspart (novoLOG) injection 0-9 Units (has no administration in time range)  sodium chloride 0.9 % bolus 500 mL (0 mLs Intravenous Stopped 01/05/18 1205)  sodium chloride 0.9 % bolus 500 mL (0 mLs Intravenous Stopped 01/05/18 1240)  magnesium sulfate IVPB 2 g 50 mL (0 g Intravenous Stopped 01/05/18 2252)     Initial Impression / Assessment and Plan / ED Course  I have reviewed the triage vital signs and the nursing notes.  Pertinent labs & imaging results that were available during my care of the patient were reviewed by me and considered in my medical decision making (see chart for details).    Patient with significant orthostatic hypotension and mild lactic acidosis..  Likely related to dehydration.  Does have elevated white blood cell count of uncertain significance.  Blood pressure improved with IV fluids.  Low suspicion of sepsis.  UA is pending.  Discussed with hospitalist will admit for IV fluids and  observation.  Final Clinical Impressions(s) / ED Diagnoses   Final diagnoses:  Dehydration  Orthostatic hypotension    ED Discharge Orders    None       Julianne Rice, MD 01/06/18 [phone]

## 2018-01-06 DIAGNOSIS — I951 Orthostatic hypotension: Secondary | ICD-10-CM

## 2018-01-06 DIAGNOSIS — N183 Chronic kidney disease, stage 3 (moderate): Secondary | ICD-10-CM | POA: Diagnosis not present

## 2018-01-06 DIAGNOSIS — E872 Acidosis: Secondary | ICD-10-CM | POA: Diagnosis not present

## 2018-01-06 DIAGNOSIS — R531 Weakness: Secondary | ICD-10-CM | POA: Diagnosis not present

## 2018-01-06 DIAGNOSIS — I10 Essential (primary) hypertension: Secondary | ICD-10-CM | POA: Diagnosis not present

## 2018-01-06 DIAGNOSIS — E0865 Diabetes mellitus due to underlying condition with hyperglycemia: Secondary | ICD-10-CM | POA: Diagnosis not present

## 2018-01-06 DIAGNOSIS — E860 Dehydration: Secondary | ICD-10-CM

## 2018-01-06 DIAGNOSIS — F0151 Vascular dementia with behavioral disturbance: Secondary | ICD-10-CM | POA: Diagnosis not present

## 2018-01-06 DIAGNOSIS — R197 Diarrhea, unspecified: Secondary | ICD-10-CM | POA: Diagnosis not present

## 2018-01-06 DIAGNOSIS — N179 Acute kidney failure, unspecified: Secondary | ICD-10-CM

## 2018-01-06 DIAGNOSIS — E785 Hyperlipidemia, unspecified: Secondary | ICD-10-CM | POA: Diagnosis not present

## 2018-01-06 LAB — CBC
HCT: 30.5 % — ABNORMAL LOW (ref 39.0–52.0)
Hemoglobin: 10.2 g/dL — ABNORMAL LOW (ref 13.0–17.0)
MCH: 30.4 pg (ref 26.0–34.0)
MCHC: 33.4 g/dL (ref 30.0–36.0)
MCV: 91 fL (ref 78.0–100.0)
Platelets: 266 10*3/uL (ref 150–400)
RBC: 3.35 MIL/uL — AB (ref 4.22–5.81)
RDW: 13.9 % (ref 11.5–15.5)
WBC: 10.3 10*3/uL (ref 4.0–10.5)

## 2018-01-06 LAB — GLUCOSE, CAPILLARY
GLUCOSE-CAPILLARY: 111 mg/dL — AB (ref 70–99)
GLUCOSE-CAPILLARY: 65 mg/dL — AB (ref 70–99)
GLUCOSE-CAPILLARY: 182 mg/dL — AB (ref 70–99)
GLUCOSE-CAPILLARY: 114 mg/dL — AB (ref 70–99)
Glucose-Capillary: 100 mg/dL — ABNORMAL HIGH (ref 70–99)

## 2018-01-06 LAB — MAGNESIUM: Magnesium: 1.8 mg/dL (ref 1.7–2.4)

## 2018-01-06 LAB — BASIC METABOLIC PANEL
ANION GAP: 8 (ref 5–15)
BUN: 18 mg/dL (ref 8–23)
CO2: 23 mmol/L (ref 22–32)
CREATININE: 0.87 mg/dL (ref 0.61–1.24)
Calcium: 8 mg/dL — ABNORMAL LOW (ref 8.9–10.3)
Chloride: 105 mmol/L (ref 98–111)
GFR calc non Af Amer: >60 mL/min (ref >60)
Glucose, Bld: 114 mg/dL — ABNORMAL HIGH (ref 70–99)
Potassium: 3.6 mmol/L (ref 3.5–5.1)
SODIUM: 136 mmol/L (ref 135–145)

## 2018-01-06 LAB — LACTIC ACID, PLASMA: LACTIC ACID, VENOUS: 1.2 mmol/L (ref 0.5–1.9)

## 2018-01-06 LAB — HEMOGLOBIN A1C
Hgb A1c MFr Bld: 8.3 % — ABNORMAL HIGH (ref 4.8–5.6)
Mean Plasma Glucose: 191.51 mg/dL

## 2018-01-06 LAB — VITAMIN B12: VITAMIN B 12: 371 pg/mL (ref 180–914)

## 2018-01-06 MED ORDER — SACCHAROMYCES BOULARDII 250 MG PO CAPS
250.0000 mg | ORAL_CAPSULE | Freq: Two times a day (BID) | ORAL | Status: DC
  Administered 2018-01-06 – 2018-01-07 (×3): 250 mg via ORAL
  Filled 2018-01-06 (×3): qty 1

## 2018-01-06 MED ORDER — CYANOCOBALAMIN 1000 MCG/ML IJ SOLN
1000.0000 ug | INTRAMUSCULAR | Status: DC
  Administered 2018-01-06: 1000 ug via INTRAMUSCULAR
  Filled 2018-01-06: qty 1

## 2018-01-06 NOTE — Plan of Care (Signed)
Assessment complete,no visual distress noted, no complaints voiced at present.

## 2018-01-06 NOTE — Plan of Care (Signed)
  Problem: Acute Rehab PT Goals(only PT should resolve) Goal: Pt Will Go Supine/Side To Sit Outcome: Progressing Flowsheets (Taken 01/06/2018 1042) Pt will go Supine/Side to Sit: with modified independence Goal: Patient Will Transfer Sit To/From Stand Outcome: Progressing Flowsheets (Taken 01/06/2018 1042) Patient will transfer sit to/from stand: with supervision Goal: Pt Will Transfer Bed To Chair/Chair To Bed Outcome: Progressing Flowsheets (Taken 01/06/2018 1042) Pt will Transfer Bed to Chair/Chair to Bed: with supervision Goal: Pt Will Ambulate Outcome: Progressing Flowsheets (Taken 01/06/2018 1042) Pt will Ambulate: with supervision;100 feet;with rolling walker   10:43 AM, 01/06/18 Lonell Grandchild, MPT Physical Therapist with [facility] [phone] office [phone] mobile phone

## 2018-01-06 NOTE — Progress Notes (Signed)
PROGRESS NOTE    Jacob Little  TKP:[id number] DOB: Jun 24, 1935 DOA: 01/05/2018 PCP: [name], MD     Brief Narrative:  82 y.o. male with PMH significant for hx of prior stroke, HTN, HLD, type 2 diabetes with renal manifestation, CKD stage 3 and CAD; who presented to ED from nursing home due to generalized weakness, inability to ambulate today and decrease verbal interaction. There is description of intermittent diarrhea and black stools.  Patient denies CP, nausea, vomiting, abd pain, SOB, HA's, hematemesis and dysuria.      Assessment & Plan: 1-Generalized weakness: negative images for acute stroke. -most likely associated with acute dehydration and impaired electrolytes in the setting od diarrhea. -continue IVF's and supportive care -PT has seen patient and recommending HHPT -TSH and B12 WNL.  2-HLD (hyperlipidemia) -continue statins   3-Essential hypertension -stable and no orthostatic hypotension  -will continue current dose of antihypertensive agents. -continue heart healthy diet   4-Diabetes mellitus with hyperglycemia, without long-term current use of insulin (HCC) -A1C 8.3 -continue using SSI -patient with diarrhea most likely as side effects of using metformin and ozempic  -possible stopping metformin at discharge and using PRN loperamide. Other option is starting insulin therapy as an outpatient.  5-Vascular dementia with behavior disturbance -continue supportive care -per family mentation back to baseline today.  6-diarrhea -no fever, no abd pain, no nausea, no vomiting and no abd pain on exam -doubt infectious -family will like to have C. Diff r/o -I believe diarrhea most likely associated with medications -will start florastor  7-Acute renal failure superimposed on stage 3 chronic kidney disease ([place]) -resolved with IVF's -encouraged to maintain adequate PO intake and hydration  8-Lactic acidosis -resolved with IVF's   DVT prophylaxis:  SCD's Code Status: Full Family Communication: daughter at bedside  Disposition Plan: back to ALF with [facility] services for PT at discharge; most likely in am. Following family request will r/o C. Diff.   Consultants:   None   Procedures:   See below for x-ray reports   Antimicrobials:  Anti-infectives (From admission, onward)   None      Subjective: Afebrile, no CP, no SOB. More interactive today. Reports ongoing diarrhea and no eating much as per family reports.  Objective: Vitals:   01/06/18 0506 01/06/18 1036 01/06/18 1040 01/06/18 2143  BP: 125/65 115/68 117/60 (!) 143/78  Pulse: 100 73 80 84  Resp: 19 16  18   Temp: 99 F (37.2 C) 98.2 F (36.8 C)  99.5 F (37.5 C)  TempSrc: Oral Oral  Oral  SpO2: 93% 98%  95%  Weight:      Height:        Intake/Output Summary (Last 24 hours) at 01/06/2018 2215 Last data filed at 01/06/2018 2100 Gross per 24 hour  Intake 3105 ml  Output 1050 ml  Net 2055 ml   Filed Weights   01/05/18 1028 01/05/18 1731  Weight: 71.2 kg (157 lb) 67.4 kg (148 lb 9.4 oz)    Examination: General exam: Alert, awake, oriented x 2; in no acute distress. Reports still having diarrhea and per family no eating much. Respiratory system: Clear to auscultation. Respiratory effort normal. Cardiovascular system:RRR. No JVD, rubs or gallops. Gastrointestinal system: Abdomen is nondistended, soft and nontender. No organomegaly or masses felt. Normal bowel sounds heard. Central nervous system: Alert and oriented. No focal neurological deficits. Extremities: No C/C/E, +pedal pulses Skin: No rashes, lesions or ulcers Psychiatry:Mood & affect appropriate. Following commands appropriately.  Data Reviewed: I have  personally reviewed following labs and imaging studies  CBC: Recent Labs  Lab 01/05/18 1047 01/06/18 0517  WBC 14.2* 10.3  HGB 11.6* 10.2*  HCT 35.1* 30.5*  MCV 91.9 91.0  PLT 318 834   Basic Metabolic Panel: Recent Labs  Lab 01/05/18 1047  01/05/18 1049 01/05/18 1952 01/06/18 0517  NA 135  --   --  136  K 4.6  --   --  3.6  CL 102  --   --  105  CO2 24  --   --  23  GLUCOSE 161*  --   --  114*  BUN 22  --   --  18  CREATININE 1.27*  --   --  0.87  CALCIUM 8.6*  --   --  8.0*  MG  --  1.3*  --  1.8  PHOS  --   --  2.4*  --    GFR: Estimated Creatinine Clearance: 64.6 mL/min (by C-G formula based on SCr of 0.87 mg/dL).  Liver Function Tests: Recent Labs  Lab 01/05/18 1047  AST 18  ALT 18  ALKPHOS 46  BILITOT 0.8  PROT 6.5  ALBUMIN 2.9*   Cardiac Enzymes: Recent Labs  Lab 01/05/18 1113  TROPONINI <0.03   HbA1C: Recent Labs    01/05/18 1952  HGBA1C 8.3*   CBG: Recent Labs  Lab 01/06/18 0730 01/06/18 0803 01/06/18 1105 01/06/18 1610 01/06/18 2149  GLUCAP 65* 111* 182* 100* 114*   Thyroid Function Tests: Recent Labs    01/05/18 1347  TSH 3.451   Anemia Panel: Recent Labs    01/05/18 1347  VITAMINB12 371   Urine analysis:    Component Value Date/Time   COLORURINE AMBER (A) 01/05/2018 1635   APPEARANCEUR HAZY (A) 01/05/2018 1635   LABSPEC 1.023 01/05/2018 1635   PHURINE 5.0 01/05/2018 1635   GLUCOSEU NEGATIVE 01/05/2018 1635   HGBUR NEGATIVE 01/05/2018 1635   BILIRUBINUR NEGATIVE 01/05/2018 1635   KETONESUR 5 (A) 01/05/2018 1635   PROTEINUR NEGATIVE 01/05/2018 1635   NITRITE NEGATIVE 01/05/2018 1635   LEUKOCYTESUR SMALL (A) 01/05/2018 1635    Radiology Studies: Ct Head Wo Contrast  Result Date: 01/05/2018 CLINICAL DATA:  Diarrhea since yesterday, weakness. History of stroke. EXAM: CT HEAD WITHOUT CONTRAST TECHNIQUE: Contiguous axial images were obtained from the base of the skull through the vertex without intravenous contrast. COMPARISON:  Head CT dated 11/08/2017.  Brain MRI dated 11/09/2017. FINDINGS: Brain: Generalized age related volume loss with commensurate dilatation of the ventricles and sulci. Chronic small vessel ischemic changes are again noted within the bilateral  periventricular and subcortical white matter regions. No mass, hemorrhage, edema or other evidence of acute parenchymal abnormality. No extra-axial hemorrhage. Vascular: There are chronic calcified atherosclerotic changes of the large vessels at the skull base. No unexpected hyperdense vessel. Skull: Normal. Negative for fracture or focal lesion. Sinuses/Orbits: No acute finding. Other: None. IMPRESSION: 1. No acute findings.  No intracranial mass, hemorrhage or edema. 2. Chronic small vessel ischemic changes within the white matter. Electronically Signed   By: Franki Cabot M.D.   On: 01/05/2018 21:24   Dg Chest Port 1 View  Result Date: 01/05/2018 CLINICAL DATA:  Former smoker presenting with fatigue, diarrhea and hypoxia. Current history of hypertension and diabetes. Former smoker. EXAM: PORTABLE CHEST 1 VIEW COMPARISON:  11/08/2017, 11/23/2016 and earlier. FINDINGS: Cardiac silhouette upper normal in size for AP technique, unchanged. Thoracic aorta atherosclerotic, unchanged. Prominent central pulmonary arteries, unchanged. Mildly prominent bronchovascular markings diffusely  and moderate central peribronchial thickening, unchanged. No new pulmonary parenchymal abnormalities. No visible pleural effusions. IMPRESSION: 1.  No acute cardiopulmonary disease. 2. Stable moderate changes of chronic bronchitis and/or asthma. Electronically Signed   By: Evangeline Dakin M.D.   On: 01/05/2018 12:00    Scheduled Meds: . aspirin EC  325 mg Oral Daily  . atorvastatin  20 mg Oral Daily  . cyanocobalamin  1,000 mcg Intramuscular Q30 days  . divalproex  250 mg Oral Q8H  . insulin aspart  0-9 Units Subcutaneous TID WC  . isosorbide mononitrate  15 mg Oral BID  . metoprolol tartrate  12.5 mg Oral BID  . pantoprazole  40 mg Oral Daily  . saccharomyces boulardii  250 mg Oral BID   Continuous Infusions: . sodium chloride 75 mL/hr at 01/06/18 1856     LOS: 0 days    Time spent: 30 minutes.     Barton Dubois, MD [facility] Pager [phone]  If 7PM-7AM, please contact night-coverage [email or web address] Password [facility] 01/06/2018, 10:15 PM

## 2018-01-06 NOTE — Care Management Note (Signed)
Case Management Note  Patient Details  Name: Jacob Little MRN: [id number] Date of Birth: 1935/11/19  Subjective/Objective:       Admitted with generalized weakness from [facility] ALF. Pt's daughter at bedside. Pt recently [place] from [facility] PT through [facility].              Action/Plan: DC back to ALF today. CSW to make arrangements for return to facility. Pt needs HH PT, daughter requests [facility] be used again. Aware that [facility] has 48 hrs to make first visit. Sunday Corn, [facility] rep, given referral.  Expected Discharge Date:     01/06/2018             Expected Discharge Plan:  Assisted Living / Rest Home  In-House Referral:  Clinical Social Work  Discharge planning Services  CM Consult  Post Acute Care Choice:  Home Health Choice offered to:  Adult Children  DME Arranged:    DME Agency:     HH Arranged:  PT HH Agency:  [facility]  Status of Service:  Completed, signed off  If discussed at [place] of Stay Meetings, dates discussed:    Additional Comments:  Sherald Barge, RN 01/06/2018, 3:32 PM

## 2018-01-06 NOTE — Care Management Obs Status (Signed)
[facility] NOTIFICATION   Patient Details  Name: Jacob Little MRN: [id number] Date of Birth: 1936-02-25   Medicare Observation Status Notification Given:  Yes    Sherald Barge, RN 01/06/2018, 3:31 PM

## 2018-01-06 NOTE — Evaluation (Signed)
Physical Therapy Evaluation Patient Details Name: Jacob Little MRN: [id number] DOB: 03-Sep-1935 Today's Date: 01/06/2018   History of Present Illness  Jacob Little is a 82 y.o. male with PMH significant for hx of prior stroke, HTN, HLD, type 2 diabetes with renal manifestation, CKD stage 3 and CAD; who presented to ED from nursing home due to generalized weakness, inability to ambulate today and decrease verbal interaction. There is description of intermittent diarrhea and black stools.     Clinical Impression  Patient functioning near baseline for functional mobility and gait, demonstrates shaky movement of BUE when completing functional tasks, slightly impulsive behavior, able to transfer to commode in bathroom, washed hands over sink afterwards, and able to ambulate in hallway without loss of balance.  Patient tolerated sitting up in chair after therapy.  Patient will benefit from continued physical therapy in hospital and recommended venue below to increase strength, balance, endurance for safe ADLs and gait.    Follow Up Recommendations Home health PT;Supervision for mobility/OOB    Equipment Recommendations  None recommended by PT    Recommendations for Other Services       Precautions / Restrictions Precautions Precautions: Fall Restrictions Weight Bearing Restrictions: No      Mobility  Bed Mobility Overal bed mobility: Needs Assistance Bed Mobility: Supine to Sit     Supine to sit: Supervision     General bed mobility comments: slow labored movement  Transfers Overall transfer level: Needs assistance Equipment used: Rolling walker (2 wheeled) Transfers: Sit to/from [facility] Sit to Stand: Min guard Stand pivot transfers: Min guard       General transfer comment: slightly labored unsteady movement with trembling of BUE  Ambulation/Gait Ambulation/Gait assistance: Min guard Gait Distance (Feet): 85 Feet Assistive device: Rolling walker  (2 wheeled) Gait Pattern/deviations: Decreased step length - right;Decreased step length - left;Decreased stride length Gait velocity: slow   General Gait Details: slightly unsteady cadence without loss of balance  Stairs            Wheelchair Mobility    Modified Rankin (Stroke Patients Only)       Balance Overall balance assessment: Needs assistance Sitting-balance support: Feet supported;No upper extremity supported Sitting balance-Leahy Scale: Good     Standing balance support: During functional activity;No upper extremity supported Standing balance-Leahy Scale: Poor Standing balance comment: fair/poor without AD, fair using RW                             Pertinent Vitals/Pain Pain Assessment: No/denies pain    Home Living Family/patient expects to be discharged to:: Assisted living               Home Equipment: Walker - 2 wheels      Prior Function Level of Independence: Needs assistance   Gait / Transfers Assistance Needed: household ambulator with RW  ADL's / Homemaking Assistance Needed: assisted by ALF staff        Hand Dominance   Dominant Hand: Right    Extremity/Trunk Assessment   Upper Extremity Assessment Upper Extremity Assessment: Generalized weakness    Lower Extremity Assessment Lower Extremity Assessment: Generalized weakness    Cervical / Trunk Assessment Cervical / Trunk Assessment: Normal  Communication   Communication: No difficulties  Cognition Arousal/Alertness: Awake/alert Behavior During Therapy: WFL for tasks assessed/performed Overall Cognitive Status: History of cognitive impairments - at baseline  General Comments      Exercises     Assessment/Plan    PT Assessment Patient needs continued PT services  PT Problem List Decreased strength;Decreased activity tolerance;Decreased balance;Decreased mobility       PT Treatment  Interventions Stair training;Gait training;Functional mobility training;Therapeutic activities;Therapeutic exercise;Patient/family education    PT Goals (Current goals can be found in the Care Plan section)  Acute Rehab PT Goals Patient Stated Goal: return home PT Goal Formulation: With patient/family Time For Goal Achievement: 01/13/18 Potential to Achieve Goals: Good    Frequency Min 3X/week   Barriers to discharge        Co-evaluation               AM-PAC PT "6 Clicks" Daily Activity  Outcome Measure Difficulty turning over in bed (including adjusting bedclothes, sheets and blankets)?: None Difficulty moving from lying on back to sitting on the side of the bed? : None Difficulty sitting down on and standing up from a chair with arms (e.g., wheelchair, bedside commode, etc,.)?: A Little Help needed moving to and from a bed to chair (including a wheelchair)?: A Little Help needed walking in hospital room?: A Little Help needed climbing 3-5 steps with a railing? : A Lot 6 Click Score: 19    End of Session   Activity Tolerance: Patient tolerated treatment well;Patient limited by fatigue Patient left: in chair;with call bell/phone within reach;with chair alarm set;with family/visitor present Nurse Communication: Mobility status;Other (comment)(RN notified that patient left up in chair) PT Visit Diagnosis: Unsteadiness on feet (R26.81);Other abnormalities of gait and mobility (R26.89);Muscle weakness (generalized) (M62.81)    Time: 7169-6789 PT Time Calculation (min) (ACUTE ONLY): 35 min   Charges:   PT Evaluation $PT Eval Moderate Complexity: 1 Mod PT Treatments $Therapeutic Activity: 23-37 mins   PT G Codes:        10:41 AM, 2018/01/25 Lonell Grandchild, MPT Physical Therapist with [facility] 336 [phone] office [phone] mobile phone

## 2018-01-06 NOTE — Progress Notes (Signed)
Family stated that [facility] can tranport patient back there when discharged

## 2018-01-07 DIAGNOSIS — N179 Acute kidney failure, unspecified: Secondary | ICD-10-CM | POA: Diagnosis not present

## 2018-01-07 DIAGNOSIS — F0151 Vascular dementia with behavioral disturbance: Secondary | ICD-10-CM | POA: Diagnosis not present

## 2018-01-07 DIAGNOSIS — I951 Orthostatic hypotension: Secondary | ICD-10-CM | POA: Diagnosis not present

## 2018-01-07 DIAGNOSIS — E785 Hyperlipidemia, unspecified: Secondary | ICD-10-CM | POA: Diagnosis not present

## 2018-01-07 DIAGNOSIS — R197 Diarrhea, unspecified: Secondary | ICD-10-CM | POA: Diagnosis not present

## 2018-01-07 DIAGNOSIS — N183 Chronic kidney disease, stage 3 (moderate): Secondary | ICD-10-CM | POA: Diagnosis not present

## 2018-01-07 DIAGNOSIS — E872 Acidosis: Secondary | ICD-10-CM | POA: Diagnosis not present

## 2018-01-07 DIAGNOSIS — E860 Dehydration: Secondary | ICD-10-CM

## 2018-01-07 DIAGNOSIS — E0865 Diabetes mellitus due to underlying condition with hyperglycemia: Secondary | ICD-10-CM | POA: Diagnosis not present

## 2018-01-07 DIAGNOSIS — I10 Essential (primary) hypertension: Secondary | ICD-10-CM | POA: Diagnosis not present

## 2018-01-07 DIAGNOSIS — R531 Weakness: Secondary | ICD-10-CM | POA: Diagnosis not present

## 2018-01-07 LAB — GLUCOSE, CAPILLARY
GLUCOSE-CAPILLARY: 88 mg/dL (ref 70–99)
Glucose-Capillary: 95 mg/dL (ref 70–99)

## 2018-01-07 MED ORDER — SACCHAROMYCES BOULARDII 250 MG PO CAPS: 250.0000 mg | ORAL_CAPSULE | Freq: Two times a day (BID) | ORAL | 0 refills | Status: DC

## 2018-01-07 MED ORDER — PANTOPRAZOLE SODIUM 40 MG PO TBEC: 40.0000 mg | DELAYED_RELEASE_TABLET | Freq: Every day | ORAL | 1 refills | Status: DC

## 2018-01-07 MED ORDER — LORAZEPAM 2 MG/ML IJ SOLN
1.0000 mg | Freq: Once | INTRAMUSCULAR | Status: AC
Start: 2018-01-07 — End: 2018-01-07
  Administered 2018-01-07: 1 mg via INTRAVENOUS
  Filled 2018-01-07: qty 1

## 2018-01-07 MED ORDER — GLIMEPIRIDE 1 MG PO TABS: 1.0000 mg | ORAL_TABLET | ORAL | 1 refills | Status: DC

## 2018-01-07 MED ORDER — ISOSORBIDE MONONITRATE ER 30 MG PO TB24: 15.0000 mg | ORAL_TABLET | Freq: Two times a day (BID) | ORAL | Status: AC

## 2018-01-07 NOTE — Progress Notes (Signed)
Pt discharged to [facility] [place] with staff. All discharge instructions and Rx were sent with [facility] staff member.

## 2018-01-07 NOTE — NC FL2 (Signed)
[place] MEDICAID FL2 LEVEL OF CARE SCREENING TOOL     IDENTIFICATION  Patient Name: Jacob Little Birthdate: 1936/01/18 Sex: male Admission Date (Current Location): 01/05/2018  [facility] [place] Number:  [employer] and Address:  Phil [place] [address] [place]      Provider Number: [id number]  Attending Physician Name and Address:  Barton Dubois, MD  Relative Name and Phone Number:       Current Level of Care: Other (Comment)(observation) Recommended Level of Care: [employer] Prior Approval Number:    Date Approved/Denied:   PASRR Number:    Discharge Plan: Other (Comment)(ALF)    Current Diagnoses: Patient Active Problem List   Diagnosis Date Noted  . Dehydration   . Orthostatic hypotension   . Generalized weakness 01/05/2018  . Acute renal failure superimposed on stage 3 chronic kidney disease ([place]) 01/05/2018  . Lactic acidosis 01/05/2018  . Diarrhea 01/05/2018  . Vascular dementia with behavior disturbance   . Acute embolic stroke ([place]) 87/86/7672  . Acute encephalopathy 11/08/2017  . Acute ischemic stroke ([place]) 11/08/2017  . Essential hypertension 11/08/2017  . Diabetes mellitus with hyperglycemia, without long-term current use of insulin ([place]) 11/08/2017  . Acute ischemic left MCA stroke ([place]) 11/08/2017  . DENTAL PAIN 05/12/2010  . CORONARY ATHEROSCLEROSIS NATIVE CORONARY ARTERY 07/22/2009  . DM 07/12/2009  . HLD (hyperlipidemia) 07/12/2009  . HYPERTENSION 07/12/2009  . ANGINA, ATYPICAL 07/12/2009    Orientation RESPIRATION BLADDER Height & Weight     Self, Time, Place  Normal Incontinent Weight: 148 lb 9.4 oz (67.4 kg) Height:  5\' 9"  (175.3 cm)  BEHAVIORAL SYMPTOMS/MOOD NEUROLOGICAL BOWEL NUTRITION STATUS      Incontinent Diet(Regular)  AMBULATORY STATUS COMMUNICATION OF NEEDS Skin   Limited Assist Verbally Normal                       Personal Care Assistance Level of  Assistance  Bathing, Feeding, Dressing Bathing Assistance: Limited assistance Feeding assistance: Independent Dressing Assistance: Limited assistance     Functional Limitations Info  Sight, Hearing, Speech Sight Info: Adequate Hearing Info: Adequate Speech Info: Adequate    SPECIAL CARE FACTORS FREQUENCY  PT (By licensed PT)     PT Frequency: 3x/week              Contractures Contractures Info: Not present    Additional Factors Info  Code Status, Allergies Code Status Info: Full code Allergies Info: NKA           Current Medications (01/07/2018):  This is the current hospital active medication list Current Facility-Administered Medications  Medication Dose Route Frequency Provider Last Rate Last Dose  . 0.9 %  sodium chloride infusion   Intravenous Continuous Barton Dubois, MD 75 mL/hr at 01/07/18 0525    . acetaminophen (TYLENOL) tablet 650 mg  650 mg Oral Q6H PRN Barton Dubois, MD      . aspirin EC tablet 325 mg  325 mg Oral Daily Barton Dubois, MD   325 mg at 01/07/18 0948  . atorvastatin (LIPITOR) tablet 20 mg  20 mg Oral Daily Barton Dubois, MD   20 mg at 01/07/18 0946  . cyanocobalamin ((VITAMIN B-12)) injection 1,000 mcg  1,000 mcg Intramuscular Q30 days Barton Dubois, MD   1,000 mcg at 01/06/18 1208  . divalproex (DEPAKOTE) DR tablet 250 mg  250 mg Oral Q8H Barton Dubois, MD   250 mg at 01/07/18 0525  . insulin  aspart (novoLOG) injection 0-9 Units  0-9 Units Subcutaneous TID WC Barton Dubois, MD   2 Units at 01/06/18 1208  . isosorbide mononitrate (IMDUR) 24 hr tablet 15 mg  15 mg Oral BID Barton Dubois, MD   15 mg at 01/07/18 0947  . metoprolol tartrate (LOPRESSOR) tablet 12.5 mg  12.5 mg Oral BID Barton Dubois, MD   12.5 mg at 01/07/18 0948  . ondansetron (ZOFRAN) tablet 4 mg  4 mg Oral Q6H PRN Barton Dubois, MD       Or  . ondansetron [facility]) injection 4 mg  4 mg Intravenous Q6H PRN Barton Dubois, MD      . pantoprazole (PROTONIX) EC tablet 40 mg   40 mg Oral Daily Barton Dubois, MD   40 mg at 01/07/18 0946  . saccharomyces boulardii (FLORASTOR) capsule 250 mg  250 mg Oral BID Barton Dubois, MD   250 mg at 01/07/18 1914     Discharge Medications: Medication List    STOP taking these medications   metFORMIN 500 MG tablet Commonly known as:  GLUCOPHAGE     TAKE these medications   acetaminophen 325 MG tablet Commonly known as:  TYLENOL Take 2 tablets (650 mg total) by mouth every 4 (four) hours as needed for mild pain (or temp > 37.5 C (99.5 F)).   aspirin EC 325 MG tablet Take 325 mg by mouth daily.   atorvastatin 20 MG tablet Commonly known as:  LIPITOR Take 20 mg by mouth daily.   cyanocobalamin 1000 MCG/ML injection Commonly known as:  (VITAMIN B-12) Inject 1,000 mcg into the muscle every 30 (thirty) days.   divalproex 250 MG DR tablet Commonly known as:  DEPAKOTE Take 1 tablet (250 mg total) by mouth every 8 (eight) hours.   glimepiride 1 MG tablet Commonly known as:  AMARYL Take 1 tablet (1 mg total) by mouth every morning.   isosorbide mononitrate 30 MG 24 hr tablet Commonly known as:  IMDUR Take 0.5 tablets (15 mg total) by mouth 2 (two) times daily. What changed:    how much to take  when to take this   metoprolol tartrate 25 MG tablet Commonly known as:  LOPRESSOR Take 0.5 tablets (12.5 mg total) by mouth 2 (two) times daily.   nitroGLYCERIN 0.4 MG SL tablet Commonly known as:  NITROSTAT Place 0.4 mg under the tongue every 5 (five) minutes as needed for chest pain.   OZEMPIC 0.25 or 0.5 MG/DOSE Sopn Generic drug:  Semaglutide Inject 0.25 mg into the skin once a week.   pantoprazole 40 MG tablet Commonly known as:  PROTONIX Take 1 tablet (40 mg total) by mouth daily. Start taking on:  01/08/2018   saccharomyces boulardii 250 MG capsule Commonly known as:  FLORASTOR Take 1 capsule (250 mg total) by mouth 2 (two) times daily.         Relevant Imaging  Results:  Relevant Lab Results:   Additional Information SSN: [id number]  Ihor Gully, LCSW

## 2018-01-07 NOTE — Discharge Summary (Signed)
Physician Discharge Summary  Jacob Little XBM:[id number] DOB: 14-Apr-1936 DOA: 01/05/2018  PCP: [name], MD  Admit date: 01/05/2018 Discharge date: 01/07/2018  Time spent: 35 minutes  Recommendations for Outpatient Follow-up:  1. Repeat basic metabolic panel to follow-up electrolytes and renal function. 2. Reassess CBGs and further adjust hypoglycemic regimen as needed 3. Follow resolution of patient GI symptoms and if needed completely discontinue the use of Ozempic.   Discharge Diagnoses:  Principal Problem:   Generalized weakness Active Problems:   HLD (hyperlipidemia)   Essential hypertension   Diabetes mellitus with hyperglycemia, without long-term current use of insulin (HCC)   Vascular dementia with behavior disturbance   Acute renal failure superimposed on stage 3 chronic kidney disease (HCC)   Lactic acidosis   Diarrhea   Dehydration   Orthostatic hypotension   Discharge Condition: Improved.  Discharge back to assisted living facility with instruction to follow-up with PCP in 10 days.  Diet recommendation: Heart healthy and modified carbohydrates.  Filed Weights   01/05/18 1028 01/05/18 1731  Weight: 71.2 kg (157 lb) 67.4 kg (148 lb 9.4 oz)    History of present illness:  82 y.o. male with PMH significant for hx of prior stroke, HTN, HLD, type 2 diabetes with renal manifestation, CKD stage 3 and CAD; who presented to ED from nursing home due to generalized weakness, inability to ambulate today and decrease verbal interaction. There is description of intermittent diarrhea and black stools.  Patient denies CP, nausea, vomiting, abd pain, SOB, HA's, hematemesis and dysuria.   Level 5 caveat present as patient had hx of underlying dementia and is failing to properly communicate or interact with [place].  In ED work up demonstrated elevated WBC's, increase renal function from baseline, Hgb of 11.3; neg UA and neg CXR. CT head w/o acute intracranial abnormalities. FOBT  done while in ED neg.  Hospital Course:  1-Generalized weakness: negative images for acute stroke. -most likely associated with acute dehydration and impaired electrolytes in the setting od diarrhea. -maintain adequate nutrition and hydration. -PT has seen patient and recommending HHPT -TSH and B12 WNL.  2-HLD (hyperlipidemia) -continue statins   3-Essential hypertension -stable and no orthostatic hypotension appreciated -will discharge on adjusted dose of metoprolol and Imdur. -continue heart healthy diet   4-Diabetes mellitus with hyperglycemia, without long-term current use of insulin (HCC) -A1C 8.3 -will discharge on glimepiride and ozempic -if further GI symptoms persist will benefit of starting insulin therapy instead. -metformin discontinued at discharge -outpatient follow up with PCP to further adjust hypoglycemic regimen as needed.   5-Vascular dementia with behavior disturbance -continue supportive care -per family mentation back to baseline.  6-diarrhea -no fever, no elevation of WBC's, no nausea, no vomiting and no abd pain on exam -doubt infectious -no further diarrhea in 24 hours. -I believed diarrhea most likely associated with medications for diabetes. -will discharge on florastor -metformin stopped -if symptoms persisted will need to stop ozempic as well.  7-Acute renal failure superimposed on stage 3 chronic kidney disease ([place]) -resolved with IVF's and suggested to be associated with pre-renal azotemia. -encouraged to maintain adequate PO intake and hydration  8-Lactic acidosis -resolved with IVF's -advised to maintain adequate hydration  Procedures:  See below for x-ray reports   Consultations:  None.  Discharge Exam: Vitals:   01/07/18 0945 01/07/18 1350  BP: 133/75 114/65  Pulse: 73 71  Resp:  16  Temp:  98.8 F (37.1 C)  SpO2:      General exam: Alert,  awake, oriented x 2; in no acute distress.  No further diarrhea  appreciated.  Tolerating diet and without any orthostatic changes on exam. Respiratory system: Clear to auscultation. Respiratory effort normal. Cardiovascular system:RRR. No JVD, rubs or gallops. Gastrointestinal system: Abdomen is nondistended, soft and nontender. No organomegaly or masses felt. Normal bowel sounds heard. Central nervous system: Alert and oriented. No focal neurological deficits. Extremities: No C/C/E, +pedal pulses Skin: No rashes, lesions or ulcers Psychiatry:Mood & affect appropriate. Following commands appropriately.  Discharge Instructions   Discharge Instructions    Diet - low sodium heart healthy   Complete by:  As directed    Discharge instructions   Complete by:  As directed    Take medication as prescribed Maintain adequate hydration Arrange follow-up with PCP in 10 days     Allergies as of 01/07/2018   No Known Allergies     Medication List    STOP taking these medications   metFORMIN 500 MG tablet Commonly known as:  GLUCOPHAGE     TAKE these medications   acetaminophen 325 MG tablet Commonly known as:  TYLENOL Take 2 tablets (650 mg total) by mouth every 4 (four) hours as needed for mild pain (or temp > 37.5 C (99.5 F)).   aspirin EC 325 MG tablet Take 325 mg by mouth daily.   atorvastatin 20 MG tablet Commonly known as:  LIPITOR Take 20 mg by mouth daily.   cyanocobalamin 1000 MCG/ML injection Commonly known as:  (VITAMIN B-12) Inject 1,000 mcg into the muscle every 30 (thirty) days.   divalproex 250 MG DR tablet Commonly known as:  DEPAKOTE Take 1 tablet (250 mg total) by mouth every 8 (eight) hours.   glimepiride 1 MG tablet Commonly known as:  AMARYL Take 1 tablet (1 mg total) by mouth every morning.   isosorbide mononitrate 30 MG 24 hr tablet Commonly known as:  IMDUR Take 0.5 tablets (15 mg total) by mouth 2 (two) times daily. What changed:    how much to take  when to take this   metoprolol tartrate 25 MG  tablet Commonly known as:  LOPRESSOR Take 0.5 tablets (12.5 mg total) by mouth 2 (two) times daily.   nitroGLYCERIN 0.4 MG SL tablet Commonly known as:  NITROSTAT Place 0.4 mg under the tongue every 5 (five) minutes as needed for chest pain.   OZEMPIC 0.25 or 0.5 MG/DOSE Sopn Generic drug:  Semaglutide Inject 0.25 mg into the skin once a week.   pantoprazole 40 MG tablet Commonly known as:  PROTONIX Take 1 tablet (40 mg total) by mouth daily. Start taking on:  01/08/2018   saccharomyces boulardii 250 MG capsule Commonly known as:  FLORASTOR Take 1 capsule (250 mg total) by mouth 2 (two) times daily.      No Known Allergies Follow-up Information    [name], MD. Schedule an appointment as soon as possible for a visit in 10 day(s).   Specialty:  Internal Medicine Contact information: [address] [place] [address] [phone]            The results of significant diagnostics from this hospitalization (including imaging, microbiology, ancillary and laboratory) are listed below for reference.    Significant Diagnostic Studies: Ct Head Wo Contrast  Result Date: 01/05/2018 CLINICAL DATA:  Diarrhea since yesterday, weakness. History of stroke. EXAM: CT HEAD WITHOUT CONTRAST TECHNIQUE: Contiguous axial images were obtained from the base of the skull through the vertex without intravenous contrast. COMPARISON:  Head CT  dated 11/08/2017.  Brain MRI dated 11/09/2017. FINDINGS: Brain: Generalized age related volume loss with commensurate dilatation of the ventricles and sulci. Chronic small vessel ischemic changes are again noted within the bilateral periventricular and subcortical white matter regions. No mass, hemorrhage, edema or other evidence of acute parenchymal abnormality. No extra-axial hemorrhage. Vascular: There are chronic calcified atherosclerotic changes of the large vessels at the skull base. No unexpected hyperdense vessel. Skull: Normal. Negative for  fracture or focal lesion. Sinuses/Orbits: No acute finding. Other: None. IMPRESSION: 1. No acute findings.  No intracranial mass, hemorrhage or edema. 2. Chronic small vessel ischemic changes within the white matter. Electronically Signed   By: Franki Cabot M.D.   On: 01/05/2018 21:24   Dg Chest Port 1 View  Result Date: 01/05/2018 CLINICAL DATA:  Former smoker presenting with fatigue, diarrhea and hypoxia. Current history of hypertension and diabetes. Former smoker. EXAM: PORTABLE CHEST 1 VIEW COMPARISON:  11/08/2017, 11/23/2016 and earlier. FINDINGS: Cardiac silhouette upper normal in size for AP technique, unchanged. Thoracic aorta atherosclerotic, unchanged. Prominent central pulmonary arteries, unchanged. Mildly prominent bronchovascular markings diffusely and moderate central peribronchial thickening, unchanged. No new pulmonary parenchymal abnormalities. No visible pleural effusions. IMPRESSION: 1.  No acute cardiopulmonary disease. 2. Stable moderate changes of chronic bronchitis and/or asthma. Electronically Signed   By: Evangeline Dakin M.D.   On: 01/05/2018 12:00    Microbiology: No results found for this or any previous visit (from the past 240 hour(s)).   Labs: Basic Metabolic Panel: Recent Labs  Lab 01/05/18 1047 01/05/18 1049 01/05/18 1952 01/06/18 0517  NA 135  --   --  136  K 4.6  --   --  3.6  CL 102  --   --  105  CO2 24  --   --  23  GLUCOSE 161*  --   --  114*  BUN 22  --   --  18  CREATININE 1.27*  --   --  0.87  CALCIUM 8.6*  --   --  8.0*  MG  --  1.3*  --  1.8  PHOS  --   --  2.4*  --    Liver Function Tests: Recent Labs  Lab 01/05/18 1047  AST 18  ALT 18  ALKPHOS 46  BILITOT 0.8  PROT 6.5  ALBUMIN 2.9*   CBC: Recent Labs  Lab 01/05/18 1047 01/06/18 0517  WBC 14.2* 10.3  HGB 11.6* 10.2*  HCT 35.1* 30.5*  MCV 91.9 91.0  PLT 318 266   Cardiac Enzymes: Recent Labs  Lab 01/05/18 1113  TROPONINI <0.03   CBG: Recent Labs  Lab  01/06/18 1105 01/06/18 1610 01/06/18 2149 01/07/18 0720 01/07/18 1124  GLUCAP 182* 100* 114* 95 88   Signed:  Barton Dubois MD.  [facility] 01/07/2018, 2:15 PM

## 2018-01-07 NOTE — Progress Notes (Signed)
Received return call from Dr. Darrick Meigs regarding restlessness throughout shift including attempting to get OOB.Advised to give ativan 1 mg IV as a one time dose.call ended.

## 2018-01-07 NOTE — Clinical Social Work Note (Signed)
Facility notified and discharge clinicals sent. Jacob Little advised that facility would pick patient up.   Message left for patient's daughter advising of discharge.     Brody Kump, Clydene Pugh, LCSW

## 2018-01-10 DIAGNOSIS — E1122 Type 2 diabetes mellitus with diabetic chronic kidney disease: Secondary | ICD-10-CM | POA: Diagnosis not present

## 2018-01-10 DIAGNOSIS — E785 Hyperlipidemia, unspecified: Secondary | ICD-10-CM | POA: Diagnosis not present

## 2018-01-10 DIAGNOSIS — Z7982 Long term (current) use of aspirin: Secondary | ICD-10-CM | POA: Diagnosis not present

## 2018-01-10 DIAGNOSIS — Z8673 Personal history of transient ischemic attack (TIA), and cerebral infarction without residual deficits: Secondary | ICD-10-CM | POA: Diagnosis not present

## 2018-01-10 DIAGNOSIS — I951 Orthostatic hypotension: Secondary | ICD-10-CM | POA: Diagnosis not present

## 2018-01-10 DIAGNOSIS — N183 Chronic kidney disease, stage 3 (moderate): Secondary | ICD-10-CM | POA: Diagnosis not present

## 2018-01-10 DIAGNOSIS — M6281 Muscle weakness (generalized): Secondary | ICD-10-CM | POA: Diagnosis not present

## 2018-01-10 DIAGNOSIS — I2510 Atherosclerotic heart disease of native coronary artery without angina pectoris: Secondary | ICD-10-CM | POA: Diagnosis not present

## 2018-01-10 DIAGNOSIS — F0150 Vascular dementia without behavioral disturbance: Secondary | ICD-10-CM | POA: Diagnosis not present

## 2018-01-10 DIAGNOSIS — I129 Hypertensive chronic kidney disease with stage 1 through stage 4 chronic kidney disease, or unspecified chronic kidney disease: Secondary | ICD-10-CM | POA: Diagnosis not present

## 2018-01-10 DIAGNOSIS — Z794 Long term (current) use of insulin: Secondary | ICD-10-CM | POA: Diagnosis not present

## 2018-01-10 DIAGNOSIS — E1165 Type 2 diabetes mellitus with hyperglycemia: Secondary | ICD-10-CM | POA: Diagnosis not present

## 2018-01-11 DIAGNOSIS — Z111 Encounter for screening for respiratory tuberculosis: Secondary | ICD-10-CM | POA: Diagnosis not present

## 2018-02-04 DIAGNOSIS — E119 Type 2 diabetes mellitus without complications: Secondary | ICD-10-CM | POA: Diagnosis not present

## 2018-02-11 DIAGNOSIS — R413 Other amnesia: Secondary | ICD-10-CM | POA: Diagnosis not present

## 2018-02-11 DIAGNOSIS — E119 Type 2 diabetes mellitus without complications: Secondary | ICD-10-CM | POA: Diagnosis not present

## 2018-02-11 DIAGNOSIS — G4089 Other seizures: Secondary | ICD-10-CM | POA: Diagnosis not present

## 2018-03-14 ENCOUNTER — Encounter (HOSPITAL_COMMUNITY): Payer: Self-pay | Admitting: *Deleted

## 2018-03-14 ENCOUNTER — Other Ambulatory Visit: Payer: Self-pay

## 2018-03-14 ENCOUNTER — Observation Stay (HOSPITAL_COMMUNITY)
Admission: EM | Admit: 2018-03-14 | Discharge: 2018-03-16 | Disposition: A | Discharge status: Home / Self Care | Payer: PPO | Attending: Internal Medicine | Admitting: Internal Medicine

## 2018-03-14 ENCOUNTER — Emergency Department (HOSPITAL_COMMUNITY): Payer: PPO

## 2018-03-14 DIAGNOSIS — E119 Type 2 diabetes mellitus without complications: Secondary | ICD-10-CM | POA: Insufficient documentation

## 2018-03-14 DIAGNOSIS — M6281 Muscle weakness (generalized): Secondary | ICD-10-CM | POA: Insufficient documentation

## 2018-03-14 DIAGNOSIS — Z8673 Personal history of transient ischemic attack (TIA), and cerebral infarction without residual deficits: Secondary | ICD-10-CM | POA: Insufficient documentation

## 2018-03-14 DIAGNOSIS — Z79899 Other long term (current) drug therapy: Secondary | ICD-10-CM | POA: Diagnosis not present

## 2018-03-14 DIAGNOSIS — R251 Tremor, unspecified: Secondary | ICD-10-CM | POA: Diagnosis present

## 2018-03-14 DIAGNOSIS — J069 Acute upper respiratory infection, unspecified: Secondary | ICD-10-CM | POA: Diagnosis not present

## 2018-03-14 DIAGNOSIS — Z87891 Personal history of nicotine dependence: Secondary | ICD-10-CM | POA: Diagnosis not present

## 2018-03-14 DIAGNOSIS — G9340 Encephalopathy, unspecified: Secondary | ICD-10-CM | POA: Diagnosis present

## 2018-03-14 DIAGNOSIS — G9341 Metabolic encephalopathy: Principal | ICD-10-CM | POA: Diagnosis present

## 2018-03-14 DIAGNOSIS — R531 Weakness: Secondary | ICD-10-CM

## 2018-03-14 DIAGNOSIS — J208 Acute bronchitis due to other specified organisms: Secondary | ICD-10-CM | POA: Diagnosis present

## 2018-03-14 DIAGNOSIS — R4182 Altered mental status, unspecified: Secondary | ICD-10-CM | POA: Diagnosis present

## 2018-03-14 DIAGNOSIS — I10 Essential (primary) hypertension: Secondary | ICD-10-CM | POA: Diagnosis present

## 2018-03-14 DIAGNOSIS — R509 Fever, unspecified: Secondary | ICD-10-CM | POA: Diagnosis present

## 2018-03-14 DIAGNOSIS — R911 Solitary pulmonary nodule: Secondary | ICD-10-CM

## 2018-03-14 DIAGNOSIS — R05 Cough: Secondary | ICD-10-CM | POA: Diagnosis not present

## 2018-03-14 DIAGNOSIS — Z7982 Long term (current) use of aspirin: Secondary | ICD-10-CM | POA: Insufficient documentation

## 2018-03-14 DIAGNOSIS — R5081 Fever presenting with conditions classified elsewhere: Secondary | ICD-10-CM | POA: Insufficient documentation

## 2018-03-14 DIAGNOSIS — J40 Bronchitis, not specified as acute or chronic: Secondary | ICD-10-CM | POA: Insufficient documentation

## 2018-03-14 DIAGNOSIS — E139 Other specified diabetes mellitus without complications: Secondary | ICD-10-CM | POA: Diagnosis not present

## 2018-03-14 HISTORY — DX: Unspecified dementia, unspecified severity, without behavioral disturbance, psychotic disturbance, mood disturbance, and anxiety: F03.90

## 2018-03-14 HISTORY — DX: Encephalopathy, unspecified: G93.40

## 2018-03-14 LAB — COMPREHENSIVE METABOLIC PANEL
ALT: 18 U/L (ref 0–44)
AST: 16 U/L (ref 15–41)
Albumin: 3.6 g/dL (ref 3.5–5.0)
Alkaline Phosphatase: 45 U/L (ref 38–126)
Anion gap: 8 (ref 5–15)
BUN: 12 mg/dL (ref 8–23)
CO2: 25 mmol/L (ref 22–32)
Calcium: 8.7 mg/dL — ABNORMAL LOW (ref 8.9–10.3)
Chloride: 105 mmol/L (ref 98–111)
Creatinine, Ser: 1.07 mg/dL (ref 0.61–1.24)
GFR calc Af Amer: >60 mL/min (ref >60)
Glucose, Bld: 130 mg/dL — ABNORMAL HIGH (ref 70–99)
POTASSIUM: 4 mmol/L (ref 3.5–5.1)
Sodium: 138 mmol/L (ref 135–145)
Total Bilirubin: 0.8 mg/dL (ref 0.3–1.2)
Total Protein: 6.7 g/dL (ref 6.5–8.1)

## 2018-03-14 LAB — CBC
HEMATOCRIT: 35.5 % — AB (ref 39.0–52.0)
Hemoglobin: 11.4 g/dL — ABNORMAL LOW (ref 13.0–17.0)
MCH: 30.5 pg (ref 26.0–34.0)
MCHC: 32.1 g/dL (ref 30.0–36.0)
MCV: 94.9 fL (ref 78.0–100.0)
Platelets: 209 10*3/uL (ref 150–400)
RBC: 3.74 MIL/uL — AB (ref 4.22–5.81)
RDW: 13.8 % (ref 11.5–15.5)
WBC: 11.1 10*3/uL — AB (ref 4.0–10.5)

## 2018-03-14 LAB — URINALYSIS, ROUTINE W REFLEX MICROSCOPIC
BILIRUBIN URINE: NEGATIVE
GLUCOSE, UA: NEGATIVE mg/dL
HGB URINE DIPSTICK: NEGATIVE
KETONES UR: NEGATIVE mg/dL
LEUKOCYTES UA: NEGATIVE
Nitrite: NEGATIVE
PH: 7 (ref 5.0–8.0)
Protein, ur: NEGATIVE mg/dL
SPECIFIC GRAVITY, URINE: 1.012 (ref 1.005–1.030)

## 2018-03-14 LAB — CG4 I-STAT (LACTIC ACID): Lactic Acid, Venous: 1.19 mmol/L (ref 0.5–1.9)

## 2018-03-14 MED ORDER — ASPIRIN EC 325 MG PO TBEC
325.0000 mg | DELAYED_RELEASE_TABLET | Freq: Every day | ORAL | Status: DC
  Administered 2018-03-15 – 2018-03-16 (×2): 325 mg via ORAL
  Filled 2018-03-14 (×2): qty 1

## 2018-03-14 MED ORDER — ATORVASTATIN CALCIUM 10 MG PO TABS
20.0000 mg | ORAL_TABLET | Freq: Every day | ORAL | Status: DC
  Administered 2018-03-15 – 2018-03-16 (×2): 20 mg via ORAL
  Filled 2018-03-14 (×2): qty 2

## 2018-03-14 MED ORDER — ISOSORBIDE MONONITRATE ER 30 MG PO TB24
15.0000 mg | ORAL_TABLET | Freq: Two times a day (BID) | ORAL | Status: DC
  Administered 2018-03-15 – 2018-03-16 (×4): 15 mg via ORAL
  Filled 2018-03-14 (×7): qty 1

## 2018-03-14 MED ORDER — SODIUM CHLORIDE 0.9 % IV SOLN: 250.0000 mL | INTRAVENOUS | Status: DC | PRN

## 2018-03-14 MED ORDER — SACCHAROMYCES BOULARDII 250 MG PO CAPS
250.0000 mg | ORAL_CAPSULE | Freq: Two times a day (BID) | ORAL | Status: DC
  Administered 2018-03-15 – 2018-03-16 (×3): 250 mg via ORAL
  Filled 2018-03-14 (×6): qty 1

## 2018-03-14 MED ORDER — TRAZODONE HCL 50 MG PO TABS
50.0000 mg | ORAL_TABLET | Freq: Every evening | ORAL | Status: DC | PRN
  Administered 2018-03-15: 50 mg via ORAL
  Filled 2018-03-14: qty 1

## 2018-03-14 MED ORDER — ALBUTEROL SULFATE (2.5 MG/3ML) 0.083% IN NEBU: 2.5000 mg | INHALATION_SOLUTION | RESPIRATORY_TRACT | Status: DC | PRN

## 2018-03-14 MED ORDER — CYANOCOBALAMIN 1000 MCG/ML IJ SOLN
1000.0000 ug | INTRAMUSCULAR | Status: DC
  Filled 2018-03-14: qty 1

## 2018-03-14 MED ORDER — PANTOPRAZOLE SODIUM 40 MG PO TBEC
40.0000 mg | DELAYED_RELEASE_TABLET | Freq: Every day | ORAL | Status: DC
  Administered 2018-03-15 – 2018-03-16 (×2): 40 mg via ORAL
  Filled 2018-03-14 (×2): qty 1

## 2018-03-14 MED ORDER — ONDANSETRON HCL 4 MG/2ML IJ SOLN: 4.0000 mg | Freq: Four times a day (QID) | INTRAMUSCULAR | Status: DC | PRN

## 2018-03-14 MED ORDER — SODIUM CHLORIDE 0.9 % IV SOLN
INTRAVENOUS | Status: DC
  Administered 2018-03-15 – 2018-03-16 (×3): via INTRAVENOUS

## 2018-03-14 MED ORDER — POLYETHYLENE GLYCOL 3350 17 G PO PACK: 17.0000 g | PACK | Freq: Every day | ORAL | Status: DC | PRN

## 2018-03-14 MED ORDER — METOPROLOL TARTRATE 25 MG PO TABS
12.5000 mg | ORAL_TABLET | Freq: Two times a day (BID) | ORAL | Status: DC
  Administered 2018-03-15 – 2018-03-16 (×3): 12.5 mg via ORAL
  Filled 2018-03-14 (×3): qty 1

## 2018-03-14 MED ORDER — ACETAMINOPHEN 325 MG PO TABS: 650.0000 mg | ORAL_TABLET | Freq: Four times a day (QID) | ORAL | Status: DC | PRN

## 2018-03-14 MED ORDER — NITROGLYCERIN 0.4 MG SL SUBL: 0.4000 mg | SUBLINGUAL_TABLET | SUBLINGUAL | Status: DC | PRN

## 2018-03-14 MED ORDER — DIVALPROEX SODIUM 250 MG PO DR TAB
250.0000 mg | DELAYED_RELEASE_TABLET | Freq: Three times a day (TID) | ORAL | Status: DC
  Administered 2018-03-15 – 2018-03-16 (×5): 250 mg via ORAL
  Filled 2018-03-14 (×5): qty 1

## 2018-03-14 MED ORDER — ONDANSETRON HCL 4 MG PO TABS: 4.0000 mg | ORAL_TABLET | Freq: Four times a day (QID) | ORAL | Status: DC | PRN

## 2018-03-14 MED ORDER — SODIUM CHLORIDE 0.9% FLUSH
3.0000 mL | Freq: Two times a day (BID) | INTRAVENOUS | Status: DC
  Administered 2018-03-15 – 2018-03-16 (×2): 3 mL via INTRAVENOUS

## 2018-03-14 MED ORDER — HEPARIN SODIUM (PORCINE) 5000 UNIT/ML IJ SOLN
5000.0000 [IU] | Freq: Three times a day (TID) | INTRAMUSCULAR | Status: DC
  Administered 2018-03-15 – 2018-03-16 (×5): 5000 [IU] via SUBCUTANEOUS
  Filled 2018-03-14 (×5): qty 1

## 2018-03-14 MED ORDER — SODIUM CHLORIDE 0.9% FLUSH: 3.0000 mL | INTRAVENOUS | Status: DC | PRN

## 2018-03-14 MED ORDER — GLIMEPIRIDE 2 MG PO TABS
1.0000 mg | ORAL_TABLET | ORAL | Status: DC
  Filled 2018-03-14 (×2): qty 1

## 2018-03-14 MED ORDER — ACETAMINOPHEN 650 MG RE SUPP: 650.0000 mg | Freq: Four times a day (QID) | RECTAL | Status: DC | PRN

## 2018-03-14 MED ORDER — ACETAMINOPHEN 325 MG PO TABS: 650.0000 mg | ORAL_TABLET | ORAL | Status: DC | PRN

## 2018-03-14 NOTE — ED Provider Notes (Signed)
[facility] EMERGENCY DEPARTMENT Provider Note   CSN: [id number] Arrival date & time: 03/14/18  2035     History   Chief Complaint Chief Complaint  Patient presents with  . Altered Mental Status    HPI Jacob Little is a 82 y.o. male.  HPI Level 5 caveat due to dementia. Patient brought in from high [place] for confusion and agitation and shakiness.  Has had for the last day or 2.  Has been doing well the last few days.  Occasional cough.  Reportedly has been more confused. Past Medical History:  Diagnosis Date  . Coronary artery disease    DES to circumflex 2006  . Dementia   . Encephalopathy   . Essential hypertension   . Hyperlipidemia   . Rheumatic fever   . Stroke ([place])   . Type 2 diabetes mellitus [facility])     Patient Active Problem List   Diagnosis Date Noted  . Dehydration   . Orthostatic hypotension   . Generalized weakness 01/05/2018  . Acute renal failure superimposed on stage 3 chronic kidney disease ([place]) 01/05/2018  . Lactic acidosis 01/05/2018  . Diarrhea 01/05/2018  . Vascular dementia with behavior disturbance   . Acute embolic stroke ([place]) 28/78/6767  . Acute encephalopathy 11/08/2017  . Acute ischemic stroke ([place]) 11/08/2017  . Essential hypertension 11/08/2017  . Diabetes mellitus with hyperglycemia, without long-term current use of insulin ([place]) 11/08/2017  . Acute ischemic left MCA stroke ([place]) 11/08/2017  . DENTAL PAIN 05/12/2010  . CORONARY ATHEROSCLEROSIS NATIVE CORONARY ARTERY 07/22/2009  . DM 07/12/2009  . HLD (hyperlipidemia) 07/12/2009  . HYPERTENSION 07/12/2009  . ANGINA, ATYPICAL 07/12/2009    Past Surgical History:  Procedure Laterality Date  . APPENDECTOMY    . CORONARY ANGIOPLASTY WITH STENT PLACEMENT          Home Medications    Prior to Admission medications   Medication Sig Start Date End Date Taking? Authorizing Provider  acetaminophen (TYLENOL) 325 MG tablet Take 2 tablets (650 mg total) by mouth every 4 (four)  hours as needed for mild pain (or temp > 37.5 C (99.5 F)). 11/12/17  Yes Barton Dubois, MD  aspirin EC 325 MG tablet Take 325 mg by mouth daily.   Yes [provider]  atorvastatin (LIPITOR) 20 MG tablet Take 20 mg by mouth daily.   Yes [provider]  cyanocobalamin (,VITAMIN B-12,) 1000 MCG/ML injection Inject 1,000 mcg into the muscle every 30 (thirty) days.  09/14/17  Yes [provider]  divalproex (DEPAKOTE) 250 MG DR tablet Take 1 tablet (250 mg total) by mouth every 8 (eight) hours. 11/12/17  Yes Barton Dubois, MD  glimepiride (AMARYL) 1 MG tablet Take 1 tablet (1 mg total) by mouth every morning. 01/07/18 01/07/19 Yes Barton Dubois, MD  isosorbide mononitrate (IMDUR) 30 MG 24 hr tablet Take 0.5 tablets (15 mg total) by mouth 2 (two) times daily. 01/07/18 04/07/18 Yes Barton Dubois, MD  metoprolol tartrate (LOPRESSOR) 25 MG tablet Take 0.5 tablets (12.5 mg total) by mouth 2 (two) times daily. 11/12/17  Yes Barton Dubois, MD  nitroGLYCERIN (NITROSTAT) 0.4 MG SL tablet Place 0.4 mg under the tongue every 5 (five) minutes as needed for chest pain.   Yes [provider]  pantoprazole (PROTONIX) 40 MG tablet Take 1 tablet (40 mg total) by mouth daily. 01/08/18  Yes Barton Dubois, MD  saccharomyces boulardii (FLORASTOR) 250 MG capsule Take 1 capsule (250 mg total) by mouth 2 (two) times daily. 01/07/18  Yes Barton Dubois, MD  OZEMPIC 0.25 or 0.5 MG/DOSE SOPN Inject 0.25 mg into the skin once a week. Patient not taking: Reported on 03/14/2018 11/12/17   Barton Dubois, MD    Family History History reviewed. No pertinent family history.  Social History Social History   Tobacco Use  . Smoking status: Former Smoker    Types: Cigarettes    Last attempt to quit: 06/15/2005    Years since quitting: 12.7  . Smokeless tobacco: Never Used  Substance Use Topics  . Alcohol use: No    Alcohol/week: 0.0 standard drinks  . Drug use: No     Allergies   Patient has  no known allergies.   Review of Systems Review of Systems  Unable to perform ROS: Mental status change     Physical Exam Updated Vital Signs BP (!) 148/76   Pulse (!) 59   Temp (!) 100.9 F (38.3 C) (Rectal)   Resp 20   Ht 5\' 5"  (1.651 m)   Wt 70.3 kg   SpO2 95%   BMI 25.79 kg/m   Physical Exam  Constitutional: He appears well-developed.  HENT:  Head: Normocephalic.  Eyes: EOM are normal.  Neck: Neck supple.  Cardiovascular: Normal rate.  Pulmonary/Chest:  Mildly harsh breath sound.  Abdominal: There is no tenderness.  Musculoskeletal: He exhibits no tenderness.  Neurological: He is alert.  Mild confusion.  Tremor bilaterally.  Skin: Skin is warm. Capillary refill takes less than 2 seconds.     ED Treatments / Results  Labs (all labs ordered are listed, but only abnormal results are displayed) Labs Reviewed  COMPREHENSIVE METABOLIC PANEL - Abnormal; Notable for the following components:      Result Value   Glucose, Bld 130 (*)    Calcium 8.7 (*)    All other components within normal limits  CBC - Abnormal; Notable for the following components:   WBC 11.1 (*)    RBC 3.74 (*)    Hemoglobin 11.4 (*)    HCT 35.5 (*)    All other components within normal limits  CULTURE, BLOOD (ROUTINE X 2)  CULTURE, BLOOD (ROUTINE X 2)  URINALYSIS, ROUTINE W REFLEX MICROSCOPIC  CBG MONITORING, ED  I-STAT CG4 LACTIC ACID, ED  CG4 I-STAT (LACTIC ACID)  I-STAT CG4 LACTIC ACID, ED    EKG None  Radiology Dg Chest 2 View  Result Date: 03/14/2018 CLINICAL DATA:  Confusion, cough EXAM: CHEST - 2 VIEW COMPARISON:  01/05/2018 FINDINGS: Lungs are clear.  No pleural effusion or pneumothorax. Heart is normal in size. Degenerative changes of the visualized thoracolumbar spine. IMPRESSION: No evidence of acute cardiopulmonary disease. Electronically Signed   By: Julian Hy M.D.   On: 03/14/2018 21:40    Procedures Procedures (including critical care time)  Medications  Ordered in ED Medications - No data to display   Initial Impression / Assessment and Plan / ED Course  I have reviewed the triage vital signs and the nursing notes.  Pertinent labs & imaging results that were available during my care of the patient were reviewed by me and considered in my medical decision making (see chart for details).     Patient presents in with fever and mental status changes.  Temperature 100.9.  Urine chest x-ray reassuring but has had a cough.  Labs overall reassuring.  However with the mental status changes patient may benefit from overnight monitoring of his mental status.  Will discuss with hospitalist.  I do not think requires a  lumbar puncture at this time.  Final Clinical Impressions(s) / ED Diagnoses   Final diagnoses:  Encephalopathy  Fever, unspecified fever cause  Upper respiratory tract infection, unspecified type    ED Discharge Orders    None       Davonna Belling, MD 03/14/18 2323

## 2018-03-14 NOTE — H&P (Signed)
Patient Demographics:    Jacob Little, is a 82 y.o. male  MRN: [id number]   DOB - December 19, 1935  Admit Date - 03/14/2018  Outpatient Primary MD for the patient is [name], MD   Assessment & Plan:    Principal Problem:   Fever with Metabolic encephalopathy Active Problems:   Encephalopathy   Essential hypertension   Generalized weakness   Diabetes 1.5, managed as type 2 ([place])    1)Febrile Illness---- ???  Viral illness, influenza negative, patient had nausea vomiting, fever of 100.9, left arm twitching/shaking, malaise fatigue and confusion, influenza test is negative, UA unremarkable, chest x-ray without acute pulmonary findings, blood cultures pending, supportive and symptomatic treatment for now pending culture results.  Lactic acid is not elevated, white count is 11, consider procalcitonin if fever persist . hold off on antibiotics at this time  2)Acute Toxic Metabolic Encephalopathy/Transient Confusion----at baseline patient is usually oriented,  largely independent at the assisted living facility, patient usually can feed himself, dress himself , walk with a walker today became confused, disoriented and unable to perform usual ADLs according to caregivers, most likely due to febrile illness.  CT head pending  3)DM2-not well controlled, last A1c is 8.3, continue Amaryl with meals, Use Novolog/Humalog Sliding scale insulin with Accu-Cheks/Fingersticks as ordered   4)H/o CVAs--- stable, CT head pending as above #1 to rule out new acute stroke, continue aspirin and Lipitor as ordered  5)H/o CAD and Prior MI/Stent-- stable, no ACS symptoms at this time , continue isosorbide 15 mg twice daily, metoprolol 12.5 mg twice daily  6)Dementia--- as per caregivers confusion appears to be above and beyond patient's usual  baseline, continue Depakote 250 mg q 8 hrs, lorazepam prn  With History of - Reviewed by me  Past Medical History:  Diagnosis Date  . Coronary artery disease    DES to circumflex 2006  . Dementia   . Encephalopathy   . Essential hypertension   . Hyperlipidemia   . Rheumatic fever   . Stroke ([place])   . Type 2 diabetes mellitus ([place])       Past Surgical History:  Procedure Laterality Date  . APPENDECTOMY    . CORONARY ANGIOPLASTY WITH STENT PLACEMENT      Chief Complaint  Patient presents with  . Altered Mental Status      HPI:    Jacob Little  is a 82 y.o. male with past medical history relevant for diabetes, CAD with prior MI and prior drug-eluting stent to circumflex artery in 2006, prior history of stroke who presents from [facility] assisted living facility with concerns about fevers, left arm tremors and shakiness, productive cough congestion nausea vomiting malaise.  Patient apparently developed a confusional episode this evening  at baseline patient is usually oriented,  largely independent at the assisted living facility, patient usually can feed himself, dress himself , walk with a walker today became confused, disoriented and unable to perform usual  ADLs according to caregivers  Additional history obtained from 2 caregivers at bedside from the assisted living facility  At the time  of my evaluation patient actually as per caregiver becoming more coherent and less confused  No falls no head injury, no chest pains no palpitations, no pleuritic symptoms no leg swelling  In ED--- patient is noted to have a temp of 100.9, white count of 11 and productive cough, UA unremarkable, chest x-ray without acute findings, influenza test is negative  ED provider obtain blood cultures which are pending   Review of systems:    In addition to the HPI above,   A full Review of  Systems was done, all other systems reviewed are negative except as noted above in HPI , .   Social  History:  Reviewed by me    Social History   Tobacco Use  . Smoking status: Former Smoker    Types: Cigarettes    Last attempt to quit: 06/15/2005    Years since quitting: 12.7  . Smokeless tobacco: Never Used  Substance Use Topics  . Alcohol use: No    Alcohol/week: 0.0 standard drinks     Family History :  Reviewed by me  HTN   Home Medications:   Prior to Admission medications   Medication Sig Start Date End Date Taking? Authorizing Provider  acetaminophen (TYLENOL) 325 MG tablet Take 2 tablets (650 mg total) by mouth every 4 (four) hours as needed for mild pain (or temp > 37.5 C (99.5 F)). 11/12/17  Yes Barton Dubois, MD  aspirin EC 325 MG tablet Take 325 mg by mouth daily.   Yes [provider]  atorvastatin (LIPITOR) 20 MG tablet Take 20 mg by mouth daily.   Yes [provider]  cyanocobalamin (,VITAMIN B-12,) 1000 MCG/ML injection Inject 1,000 mcg into the muscle every 30 (thirty) days.  09/14/17  Yes [provider]  divalproex (DEPAKOTE) 250 MG DR tablet Take 1 tablet (250 mg total) by mouth every 8 (eight) hours. 11/12/17  Yes Barton Dubois, MD  glimepiride (AMARYL) 1 MG tablet Take 1 tablet (1 mg total) by mouth every morning. 01/07/18 01/07/19 Yes Barton Dubois, MD  isosorbide mononitrate (IMDUR) 30 MG 24 hr tablet Take 0.5 tablets (15 mg total) by mouth 2 (two) times daily. 01/07/18 04/07/18 Yes Barton Dubois, MD  metoprolol tartrate (LOPRESSOR) 25 MG tablet Take 0.5 tablets (12.5 mg total) by mouth 2 (two) times daily. 11/12/17  Yes Barton Dubois, MD  nitroGLYCERIN (NITROSTAT) 0.4 MG SL tablet Place 0.4 mg under the tongue every 5 (five) minutes as needed for chest pain.   Yes [provider]  pantoprazole (PROTONIX) 40 MG tablet Take 1 tablet (40 mg total) by mouth daily. 01/08/18  Yes Barton Dubois, MD  saccharomyces boulardii (FLORASTOR) 250 MG capsule Take 1 capsule (250 mg total) by mouth 2 (two) times daily. 01/07/18  Yes Barton Dubois, MD  OZEMPIC 0.25 or 0.5 MG/DOSE SOPN Inject 0.25 mg into the skin once a week. Patient not taking: Reported on 03/14/2018 11/12/17   Barton Dubois, MD     Allergies:    No Known Allergies   Physical Exam:   Vitals  Blood pressure 135/87, pulse 68, temperature (!) 100.9 F (38.3 C), temperature source Rectal, resp. rate 19, height 5\' 5"  (1.651 m), weight 70.3 kg, SpO2 98 %.  Physical Examination: General appearance - alert, non-toxic appearing, and in no distress  Mental status - alert, oriented to person, place, and time,  Eyes - sclera anicteric Neck - supple, no JVD elevation , Chest - clear  to auscultation bilaterally, symmetrical air movement,  Heart - S1 and S2 normal, regular Abdomen - soft, nontender, nondistended, no masses or organomegaly Neurological -as per caregivers confusion disorientation appears to be resolving at this time, patient does have some left upper extremity shakiness/tremors, gait is slightly unsteady which is not unusual for him at baseline he uses a walker.  No new focal neuro deficits at this time otherwise Extremities - no pedal edema noted, intact peripheral pulses  Skin - warm, dry   Data Review:    CBC Recent Labs  Lab 03/14/18 2121  WBC 11.1*  HGB 11.4*  HCT 35.5*  PLT 209  MCV 94.9  MCH 30.5  MCHC 32.1  RDW 13.8   ---------------------------------------------------------------------------------------------------------------  Chemistries  Recent Labs  Lab 03/14/18 2121  NA 138  K 4.0  CL 105  CO2 25  GLUCOSE 130*  BUN 12  CREATININE 1.07  CALCIUM 8.7*  AST 16  ALT 18  ALKPHOS 45  BILITOT 0.8   ---------------------------------------------------------------------------------------------------------------estimated creatinine clearance is 47.9 mL/min (by C-G formula based on SCr of 1.07 mg/dL). ------------------------------------------------------------------------------------------------------------------ No  results for input(s): TSH, T4TOTAL, T3FREE, THYROIDAB in the last 72 hours.  Invalid input(s): FREET3   Coagulation profile No results for input(s): INR, PROTIME in the last 168 hours. ------------------------------------------------------------------------------------------------------------------- No results for input(s): DDIMER in the last 72 hours. --------------------------------------------------------------------------------------------------------------- Cardiac Enzymes No results for input(s): CKMB, TROPONINI, MYOGLOBIN in the last 168 hours.  Invalid input(s): CK --------------------------------------------------------------------------------------------------------------- No results found for: BNP   ---------------------------------------------------------------------------------------------------------------  Urinalysis    Component Value Date/Time   COLORURINE YELLOW 03/14/2018 2103   APPEARANCEUR CLEAR 03/14/2018 2103   LABSPEC 1.012 03/14/2018 2103   PHURINE 7.0 03/14/2018 2103   GLUCOSEU NEGATIVE 03/14/2018 2103   HGBUR NEGATIVE 03/14/2018 2103   BILIRUBINUR NEGATIVE 03/14/2018 2103   KETONESUR NEGATIVE 03/14/2018 2103   PROTEINUR NEGATIVE 03/14/2018 2103   NITRITE NEGATIVE 03/14/2018 2103   LEUKOCYTESUR NEGATIVE 03/14/2018 2103    --------------------------------------------------------------------------------------------------------------   Imaging Results:    Dg Chest 2 View  Result Date: 03/14/2018 CLINICAL DATA:  Confusion, cough EXAM: CHEST - 2 VIEW COMPARISON:  01/05/2018 FINDINGS: Lungs are clear.  No pleural effusion or pneumothorax. Heart is normal in size. Degenerative changes of the visualized thoracolumbar spine. IMPRESSION: No evidence of acute cardiopulmonary disease. Electronically Signed   By: Julian Hy M.D.   On: 03/14/2018 21:40    Radiological Exams on Admission: Dg Chest 2 View  Result Date: 03/14/2018 CLINICAL DATA:   Confusion, cough EXAM: CHEST - 2 VIEW COMPARISON:  01/05/2018 FINDINGS: Lungs are clear.  No pleural effusion or pneumothorax. Heart is normal in size. Degenerative changes of the visualized thoracolumbar spine. IMPRESSION: No evidence of acute cardiopulmonary disease. Electronically Signed   By: Julian Hy M.D.   On: 03/14/2018 21:40    DVT Prophylaxis -SCD  /heparin AM Labs Ordered, also please review Full Orders  Family Communication: Admission, patients condition and plan of care including tests being ordered have been discussed with the patient and caregivers from ALF who indicate understanding and agree with the plan   Code Status - Full Code  Lik[facility] to [facility]  Condition   Stable  Roxan Hockey M.D on 03/14/2018 at 11:44 PM Pager---[phone] Go to [email or web address] - password [email or web address] for contact info  [facility] - Office  [phone]

## 2018-03-14 NOTE — ED Notes (Signed)
Pt with productive cough noted in triage.

## 2018-03-14 NOTE — ED Triage Notes (Signed)
Pt with left arm swelling noted 30 min. Ago per caregiver at [facility], pt with sudden jerking movement at times.  Pt more agitated after dinner today.  Confused per caregiver.  Pt denies pain.

## 2018-03-14 NOTE — ED Notes (Signed)
Pt HOH.  Pt alert and oriented to year, not month, age and place.

## 2018-03-15 ENCOUNTER — Observation Stay (HOSPITAL_COMMUNITY): Payer: PPO

## 2018-03-15 ENCOUNTER — Other Ambulatory Visit: Payer: Self-pay

## 2018-03-15 ENCOUNTER — Encounter (HOSPITAL_COMMUNITY): Payer: Self-pay

## 2018-03-15 DIAGNOSIS — R509 Fever, unspecified: Secondary | ICD-10-CM | POA: Diagnosis not present

## 2018-03-15 DIAGNOSIS — J069 Acute upper respiratory infection, unspecified: Secondary | ICD-10-CM | POA: Diagnosis not present

## 2018-03-15 DIAGNOSIS — G9341 Metabolic encephalopathy: Secondary | ICD-10-CM

## 2018-03-15 DIAGNOSIS — R251 Tremor, unspecified: Secondary | ICD-10-CM | POA: Diagnosis not present

## 2018-03-15 DIAGNOSIS — E139 Other specified diabetes mellitus without complications: Secondary | ICD-10-CM | POA: Diagnosis not present

## 2018-03-15 DIAGNOSIS — R410 Disorientation, unspecified: Secondary | ICD-10-CM | POA: Diagnosis not present

## 2018-03-15 DIAGNOSIS — I10 Essential (primary) hypertension: Secondary | ICD-10-CM | POA: Diagnosis not present

## 2018-03-15 DIAGNOSIS — G9340 Encephalopathy, unspecified: Secondary | ICD-10-CM | POA: Diagnosis not present

## 2018-03-15 DIAGNOSIS — F0390 Unspecified dementia without behavioral disturbance: Secondary | ICD-10-CM | POA: Diagnosis not present

## 2018-03-15 LAB — CBC
HCT: 34.6 % — ABNORMAL LOW (ref 39.0–52.0)
HEMOGLOBIN: 11.1 g/dL — AB (ref 13.0–17.0)
MCH: 30.8 pg (ref 26.0–34.0)
MCHC: 32.1 g/dL (ref 30.0–36.0)
MCV: 96.1 fL (ref 78.0–100.0)
Platelets: 197 10*3/uL (ref 150–400)
RBC: 3.6 MIL/uL — ABNORMAL LOW (ref 4.22–5.81)
RDW: 14.1 % (ref 11.5–15.5)
WBC: 10.1 10*3/uL (ref 4.0–10.5)

## 2018-03-15 LAB — VALPROIC ACID LEVEL: VALPROIC ACID LVL: 44 ug/mL — AB (ref 50.0–100.0)

## 2018-03-15 LAB — GLUCOSE, CAPILLARY
GLUCOSE-CAPILLARY: 132 mg/dL — AB (ref 70–99)
GLUCOSE-CAPILLARY: 99 mg/dL (ref 70–99)
Glucose-Capillary: 128 mg/dL — ABNORMAL HIGH (ref 70–99)
Glucose-Capillary: 140 mg/dL — ABNORMAL HIGH (ref 70–99)

## 2018-03-15 LAB — BASIC METABOLIC PANEL
ANION GAP: 3 — AB (ref 5–15)
BUN: 11 mg/dL (ref 8–23)
CALCIUM: 8.8 mg/dL — AB (ref 8.9–10.3)
CO2: 32 mmol/L (ref 22–32)
CREATININE: 0.92 mg/dL (ref 0.61–1.24)
Chloride: 106 mmol/L (ref 98–111)
GLUCOSE: 111 mg/dL — AB (ref 70–99)
Potassium: 3.7 mmol/L (ref 3.5–5.1)
Sodium: 141 mmol/L (ref 135–145)

## 2018-03-15 LAB — INFLUENZA PANEL BY PCR (TYPE A & B)
INFLAPCR: NEGATIVE
Influenza B By PCR: NEGATIVE

## 2018-03-15 LAB — AMMONIA: Ammonia: 46 umol/L — ABNORMAL HIGH (ref 9–35)

## 2018-03-15 MED ORDER — LORAZEPAM 2 MG/ML IJ SOLN
0.5000 mg | Freq: Four times a day (QID) | INTRAMUSCULAR | Status: DC | PRN
  Administered 2018-03-15: 0.5 mg via INTRAVENOUS

## 2018-03-15 MED ORDER — LORAZEPAM 2 MG/ML IJ SOLN
INTRAMUSCULAR | Status: AC
  Filled 2018-03-15: qty 1

## 2018-03-15 MED ORDER — GUAIFENESIN ER 600 MG PO TB12
1200.0000 mg | ORAL_TABLET | Freq: Two times a day (BID) | ORAL | Status: DC
  Administered 2018-03-15 – 2018-03-16 (×2): 1200 mg via ORAL
  Filled 2018-03-15 (×2): qty 2

## 2018-03-15 MED ORDER — INSULIN ASPART 100 UNIT/ML ~~LOC~~ SOLN
0.0000 [IU] | Freq: Three times a day (TID) | SUBCUTANEOUS | Status: DC
  Administered 2018-03-15 – 2018-03-16 (×2): 1 [IU] via SUBCUTANEOUS

## 2018-03-15 MED ORDER — IPRATROPIUM-ALBUTEROL 0.5-2.5 (3) MG/3ML IN SOLN
3.0000 mL | Freq: Four times a day (QID) | RESPIRATORY_TRACT | Status: DC
  Administered 2018-03-15 – 2018-03-16 (×3): 3 mL via RESPIRATORY_TRACT
  Filled 2018-03-15 (×3): qty 3

## 2018-03-15 NOTE — Care Management Obs Status (Signed)
[facility] NOTIFICATION   Patient Details  Name: Jacob Little MRN: [id number] Date of Birth: Jan 10, 1936   Medicare Observation Status Notification Given:  Yes    Sherald Barge, RN 03/15/2018, 10:49 AM

## 2018-03-15 NOTE — ED Notes (Signed)
Patient transported to CT 

## 2018-03-15 NOTE — Progress Notes (Signed)
PROGRESS NOTE    Jacob Little  UKG:[id number] DOB: 05/03/1936 DOA: 03/14/2018 PCP: [name], MD    Brief Narrative:  82-year-old male with history of diabetes, coronary artery disease, prior stroke, brought to the hospital from assisted living facility with fever, left arm tremors and shakiness, productive cough and congestion.  He has worsening confusion.  Admitted for further work-up of febrile illness and acute encephalopathy.   Assessment & Plan:   Principal Problem:   Fever with Metabolic encephalopathy Active Problems:   Encephalopathy   Essential hypertension   Generalized weakness   Diabetes 1.5, managed as type 2 (HCC)   Acute metabolic encephalopathy   1. Acute metabolic encephalopathy.  Etiology is not entirely clear at this time.  Possibly related to acute viral illness.  No obvious signs of bacterial infection.  Cultures are in progress.  Check ammonia.  He was recently started on Depakote.  Will check valproic acid level.  Continued observation.  He has not yet returned to baseline. 2. Febrile illness.  Influenza panel noted to be negative.  No further fever since admission.  Cultures are in process.  Suspect that he may have an upper respiratory tract viral infection.  Family has noticed more coughing lately.  Repeat chest x-ray once he is adequately hydrated. 3. Diabetes.  Holding oral agents.  Continue on sliding scale insulin. 4. History of CVA.  Stable.  CT head did not show any acute findings.  Continue aspirin and Lipitor. 5. Dementia.  Family reports that his confusion is beyond his current baseline.  Continue on Depakote and Ativan as needed for now.  He does not have any behavioral disturbances at this time. 6. Hypertension.  Blood pressure stable.  Continue on metoprolol   DVT prophylaxis: Heparin Code Status: Full code Family Communication: Discussed with daughter at the bedside Disposition Plan: Probable discharge back to assisted living facility based  on progress   Consultants:     Procedures:     Antimicrobials:      Subjective: Patient is sleeping on my arrival.  He wakes up to voice, but falls asleep during visit.  Family reports that he has been sleeping more lately.  They have noticed some tremors.  He has not had any further fever since admission.  They have noticed a cough more recently.  Objective: Vitals:   03/15/18 0600 03/15/18 0615 03/15/18 0946 03/15/18 1331  BP: 119/88  129/68 (!) 133/59  Pulse: 81  66 71  Resp: (!) 21 20 20 20   Temp:   98.4 F (36.9 C) 97.8 F (36.6 C)  TempSrc:   Oral Oral  SpO2: 92% 96% 96% 98%  Weight:   71.8 kg   Height:   5\' 5"  (1.651 m)     Intake/Output Summary (Last 24 hours) at 03/15/2018 1801 Last data filed at 03/15/2018 1333 Gross per 24 hour  Intake 462.47 ml  Output 200 ml  Net 262.47 ml   Filed Weights   03/14/18 2041 03/15/18 0946  Weight: 70.3 kg 71.8 kg    Examination:  General exam: Appears calm and comfortable  Respiratory system: Clear to auscultation. Respiratory effort normal. Cardiovascular system: S1 & S2 heard, RRR. No JVD, murmurs, rubs, gallops or clicks.  Trace pedal edema. Gastrointestinal system: Abdomen is nondistended, soft and nontender. No organomegaly or masses felt. Normal bowel sounds heard. Central nervous system:  No focal neurological deficits. Extremities: Symmetric 5 x 5 power. Skin: Skin appears to be flushed around face Psychiatry: Somnolent, wakes  up to voice    Data Reviewed: I have personally reviewed following labs and imaging studies  CBC: Recent Labs  Lab 03/14/18 2121 03/15/18 0535  WBC 11.1* 10.1  HGB 11.4* 11.1*  HCT 35.5* 34.6*  MCV 94.9 96.1  PLT 209 275   Basic Metabolic Panel: Recent Labs  Lab 03/14/18 2121 03/15/18 0535  NA 138 141  K 4.0 3.7  CL 105 106  CO2 25 32  GLUCOSE 130* 111*  BUN 12 11  CREATININE 1.07 0.92  CALCIUM 8.7* 8.8*   GFR: Estimated Creatinine Clearance: 55.7 mL/min (by  C-G formula based on SCr of 0.92 mg/dL). Liver Function Tests: Recent Labs  Lab 03/14/18 2121  AST 16  ALT 18  ALKPHOS 45  BILITOT 0.8  PROT 6.7  ALBUMIN 3.6   No results for input(s): LIPASE, AMYLASE in the last 168 hours. No results for input(s): AMMONIA in the last 168 hours. Coagulation Profile: No results for input(s): INR, PROTIME in the last 168 hours. Cardiac Enzymes: No results for input(s): CKTOTAL, CKMB, CKMBINDEX, TROPONINI in the last 168 hours. BNP (last 3 results) No results for input(s): PROBNP in the last 8760 hours. HbA1C: No results for input(s): HGBA1C in the last 72 hours. CBG: Recent Labs  Lab 03/14/18 2048 03/15/18 1254 03/15/18 1632  GLUCAP 132* 128* 140*   Lipid Profile: No results for input(s): CHOL, HDL, LDLCALC, TRIG, CHOLHDL, LDLDIRECT in the last 72 hours. Thyroid Function Tests: No results for input(s): TSH, T4TOTAL, FREET4, T3FREE, THYROIDAB in the last 72 hours. Anemia Panel: No results for input(s): VITAMINB12, FOLATE, FERRITIN, TIBC, IRON, RETICCTPCT in the last 72 hours. Sepsis Labs: Recent Labs  Lab 03/14/18 2134  LATICACIDVEN 1.19    Recent Results (from the past 240 hour(s))  Blood culture (routine x 2)     Status: None (Preliminary result)   Collection Time: 03/14/18  9:42 PM  Result Value Ref Range Status   Specimen Description BLOOD RIGHT HAND  Final   Special Requests   Final    BOTTLES DRAWN AEROBIC AND ANAEROBIC Blood Culture adequate volume   Culture   Final    NO GROWTH < 24 HOURS Performed at [facility], [address] [place], [place] [address]    Report Status PENDING  Incomplete  Blood culture (routine x 2)     Status: None (Preliminary result)   Collection Time: 03/14/18  9:47 PM  Result Value Ref Range Status   Specimen Description LEFT ANTECUBITAL  Final   Special Requests   Final    Blood Culture results may not be optimal due to an excessive volume of blood received in culture bottles   Culture    Final    NO GROWTH < 24 HOURS Performed at [facility], [address] [place],  [address]    Report Status PENDING  Incomplete         Radiology Studies: Dg Chest 2 View  Result Date: 03/14/2018 CLINICAL DATA:  Confusion, cough EXAM: CHEST - 2 VIEW COMPARISON:  01/05/2018 FINDINGS: Lungs are clear.  No pleural effusion or pneumothorax. Heart is normal in size. Degenerative changes of the visualized thoracolumbar spine. IMPRESSION: No evidence of acute cardiopulmonary disease. Electronically Signed   By: Julian Hy M.D.   On: 03/14/2018 21:40   Ct Head Wo Contrast  Result Date: 03/15/2018 CLINICAL DATA:  Dementia patient with agitation and acute confusion. EXAM: CT HEAD WITHOUT CONTRAST TECHNIQUE: Contiguous axial images were obtained from the base of the  skull through the vertex without intravenous contrast. COMPARISON:  Most recent head CT 01/05/2018.  Brain MRI 11/09/2017 FINDINGS: Brain: Unchanged degree of atrophy and chronic small vessel ischemia. Encephalomalacia in the right posterior frontal lobe is unchanged. No intracranial hemorrhage, mass effect, or midline shift. No hydrocephalus. The basilar cisterns are patent. No evidence of territorial infarct or acute ischemia. No extra-axial or intracranial fluid collection. Vascular: Atherosclerosis of skullbase vasculature without hyperdense vessel or abnormal calcification. Skull: No fracture or focal lesion. Sinuses/Orbits: No acute finding. Other: None. IMPRESSION: 1.  No acute intracranial abnormality. 2. Unchanged atrophy, chronic small vessel ischemia, and remote right posterior frontal infarct. Electronically Signed   By: Keith Rake M.D.   On: 03/15/2018 02:21        Scheduled Meds: . aspirin EC  325 mg Oral Daily  . atorvastatin  20 mg Oral Daily  . cyanocobalamin  1,000 mcg Intramuscular Q30 days  . divalproex  250 mg Oral Q8H  . guaiFENesin  1,200 mg Oral BID  . heparin  5,000 Units Subcutaneous Q8H    . insulin aspart  0-9 Units Subcutaneous TID WC  . ipratropium-albuterol  3 mL Nebulization Q6H  . isosorbide mononitrate  15 mg Oral BID  . metoprolol tartrate  12.5 mg Oral BID  . pantoprazole  40 mg Oral Daily  . saccharomyces boulardii  250 mg Oral BID  . sodium chloride flush  3 mL Intravenous Q12H   Continuous Infusions: . sodium chloride    . sodium chloride 50 mL/hr at 03/15/18 0551     LOS: 0 days    Time spent: 86mins    Kathie Dike, MD [facility] Pager [phone]  If 7PM-7AM, please contact night-coverage [email or web address] Password [email or web address] 03/15/2018, 6:01 PM

## 2018-03-16 ENCOUNTER — Observation Stay (HOSPITAL_COMMUNITY): Payer: PPO

## 2018-03-16 DIAGNOSIS — R251 Tremor, unspecified: Secondary | ICD-10-CM

## 2018-03-16 DIAGNOSIS — J208 Acute bronchitis due to other specified organisms: Secondary | ICD-10-CM | POA: Diagnosis present

## 2018-03-16 DIAGNOSIS — G9340 Encephalopathy, unspecified: Secondary | ICD-10-CM | POA: Diagnosis not present

## 2018-03-16 DIAGNOSIS — R918 Other nonspecific abnormal finding of lung field: Secondary | ICD-10-CM | POA: Diagnosis not present

## 2018-03-16 DIAGNOSIS — E139 Other specified diabetes mellitus without complications: Secondary | ICD-10-CM | POA: Diagnosis not present

## 2018-03-16 DIAGNOSIS — I10 Essential (primary) hypertension: Secondary | ICD-10-CM | POA: Diagnosis not present

## 2018-03-16 DIAGNOSIS — R509 Fever, unspecified: Secondary | ICD-10-CM | POA: Diagnosis not present

## 2018-03-16 DIAGNOSIS — G9341 Metabolic encephalopathy: Secondary | ICD-10-CM | POA: Diagnosis not present

## 2018-03-16 DIAGNOSIS — J069 Acute upper respiratory infection, unspecified: Secondary | ICD-10-CM | POA: Diagnosis not present

## 2018-03-16 DIAGNOSIS — N6459 Other signs and symptoms in breast: Secondary | ICD-10-CM | POA: Diagnosis not present

## 2018-03-16 LAB — GLUCOSE, CAPILLARY
Glucose-Capillary: 103 mg/dL — ABNORMAL HIGH (ref 70–99)
Glucose-Capillary: 123 mg/dL — ABNORMAL HIGH (ref 70–99)

## 2018-03-16 LAB — COMPREHENSIVE METABOLIC PANEL
ALBUMIN: 3.4 g/dL — AB (ref 3.5–5.0)
ALT: 15 U/L (ref 0–44)
ANION GAP: 7 (ref 5–15)
AST: 14 U/L — ABNORMAL LOW (ref 15–41)
Alkaline Phosphatase: 46 U/L (ref 38–126)
BUN: 13 mg/dL (ref 8–23)
CO2: 26 mmol/L (ref 22–32)
Calcium: 8.9 mg/dL (ref 8.9–10.3)
Chloride: 106 mmol/L (ref 98–111)
Creatinine, Ser: 0.86 mg/dL (ref 0.61–1.24)
GFR calc Af Amer: >60 mL/min (ref >60)
GFR calc non Af Amer: >60 mL/min (ref >60)
GLUCOSE: 110 mg/dL — AB (ref 70–99)
POTASSIUM: 4 mmol/L (ref 3.5–5.1)
SODIUM: 139 mmol/L (ref 135–145)
TOTAL PROTEIN: 6.4 g/dL — AB (ref 6.5–8.1)
Total Bilirubin: 0.5 mg/dL (ref 0.3–1.2)

## 2018-03-16 LAB — CBC
HCT: 34.2 % — ABNORMAL LOW (ref 39.0–52.0)
Hemoglobin: 11.3 g/dL — ABNORMAL LOW (ref 13.0–17.0)
MCH: 31.4 pg (ref 26.0–34.0)
MCHC: 33 g/dL (ref 30.0–36.0)
MCV: 95 fL (ref 78.0–100.0)
Platelets: 198 10*3/uL (ref 150–400)
RBC: 3.6 MIL/uL — ABNORMAL LOW (ref 4.22–5.81)
RDW: 13.8 % (ref 11.5–15.5)
WBC: 8.6 10*3/uL (ref 4.0–10.5)

## 2018-03-16 MED ORDER — ALBUTEROL SULFATE HFA 108 (90 BASE) MCG/ACT IN AERS: 2.0000 | INHALATION_SPRAY | Freq: Four times a day (QID) | RESPIRATORY_TRACT | 2 refills | Status: AC | PRN

## 2018-03-16 MED ORDER — GUAIFENESIN ER 600 MG PO TB12: 600.0000 mg | ORAL_TABLET | Freq: Two times a day (BID) | ORAL | 0 refills | Status: DC

## 2018-03-16 MED ORDER — ALBUTEROL SULFATE HFA 108 (90 BASE) MCG/ACT IN AERS: 2.0000 | INHALATION_SPRAY | Freq: Four times a day (QID) | RESPIRATORY_TRACT | 2 refills | Status: DC | PRN

## 2018-03-16 NOTE — Care Management (Signed)
CM spoke with POA, [employer]. Pt lives at [facility]. He has had [place] services in the past through [facility]. Not currently active with services. CSW to arrange for return to facility.

## 2018-03-16 NOTE — Clinical Social Work Note (Signed)
Butch Penny at [facility] notified of discharge. Facility to provide transport.  Discharge clinicals sent.  Daughter, Brayton Layman, notified of discharge.   LCSW signing off.     Carri Spillers, Clydene Pugh, LCSW

## 2018-03-16 NOTE — Discharge Summary (Signed)
Physician Discharge Summary  Jacob Little YQI:[id number] DOB: 05-02-1936 DOA: 03/14/2018  PCP: [name], MD  Admit date: 03/14/2018 Discharge date: 03/16/2018  Admitted From: Assisted living facility Disposition: Assisted living facility  Recommendations for Outpatient Follow-up:  1. Follow up with PCP in 1-2 weeks 2. Please obtain BMP/CBC in one week 3. Patient has been scheduled for outpatient neurology evaluation to further evaluate tremor.  Discharge Condition: Stable CODE STATUS: Full code Diet recommendation: Heart healthy, carb modified  Brief/Interim Summary: 82 year old male with a history of diabetes, coronary artery disease, prior stroke, brought to the hospital from assisted living facility with fever, left arm tremors, productive cough and congestion.  He been having tremors for the past 2 weeks.  He also had increasing confusion and sleepiness.  He was noted to be mildly febrile on arrival.  He had been having a cough and upper respiratory tract symptoms.  He was admitted for further work-up.  Discharge Diagnoses:  Principal Problem:   Fever with Metabolic encephalopathy Active Problems:   Encephalopathy   Essential hypertension   Generalized weakness   Diabetes 1.5, managed as type 2 (HCC)   Acute metabolic encephalopathy   Viral bronchitis   Tremor of left hand  1. Acute metabolic encephalopathy.  Likely related to dehydration/febrile illness from viral process.  No obvious signs of bacterial infection.  Blood cultures have shown no growth.  Depakote level was checked and found to be 44.  With hydration, mental status appears to have returned to baseline.  CT head was also found to be unremarkable. 2. Right hand tremor.  Etiology is unclear.  Family reports is been present for the past 2 weeks.  Discussed with Dr. Merlene Laughter who has recommended that patient follow-up as an outpatient for further work-up.  Family is in agreement of this. 3. Viral bronchitis.  No  evidence of pneumonia on x-ray imaging.  He is no longer having any fevers.  He has mild cough which will be treated supportively.  Continue hydration. 4. History of CVA.  Stable.  Continue aspirin and Lipitor. 5. Diabetes.  Resume oral agents on discharge. 6. Hypertension.  Blood pressure stable.  Continue metoprolol.  Discharge Instructions  Discharge Instructions    Diet - low sodium heart healthy   Complete by:  As directed    Increase activity slowly   Complete by:  As directed      Allergies as of 03/16/2018   No Known Allergies     Medication List    TAKE these medications   acetaminophen 325 MG tablet Commonly known as:  TYLENOL Take 2 tablets (650 mg total) by mouth every 4 (four) hours as needed for mild pain (or temp > 37.5 C (99.5 F)).   albuterol 108 (90 Base) MCG/ACT inhaler Commonly known as:  PROVENTIL HFA;VENTOLIN HFA Inhale 2 puffs into the lungs every 6 (six) hours as needed for wheezing or shortness of breath.   aspirin EC 325 MG tablet Take 325 mg by mouth daily.   atorvastatin 20 MG tablet Commonly known as:  LIPITOR Take 20 mg by mouth daily.   cyanocobalamin 1000 MCG/ML injection Commonly known as:  (VITAMIN B-12) Inject 1,000 mcg into the muscle every 30 (thirty) days.   divalproex 250 MG DR tablet Commonly known as:  DEPAKOTE Take 1 tablet (250 mg total) by mouth every 8 (eight) hours.   glimepiride 1 MG tablet Commonly known as:  AMARYL Take 1 tablet (1 mg total) by mouth every morning.  guaiFENesin 600 MG 12 hr tablet Commonly known as:  MUCINEX Take 1 tablet (600 mg total) by mouth 2 (two) times daily.   isosorbide mononitrate 30 MG 24 hr tablet Commonly known as:  IMDUR Take 0.5 tablets (15 mg total) by mouth 2 (two) times daily.   metoprolol tartrate 25 MG tablet Commonly known as:  LOPRESSOR Take 0.5 tablets (12.5 mg total) by mouth 2 (two) times daily.   nitroGLYCERIN 0.4 MG SL tablet Commonly known as:  NITROSTAT Place  0.4 mg under the tongue every 5 (five) minutes as needed for chest pain.   OZEMPIC (0.25 OR 0.5 MG/DOSE) 2 MG/1.5ML Sopn Generic drug:  Semaglutide(0.25 or 0.5MG /DOS) Inject 0.25 mg into the skin once a week.   pantoprazole 40 MG tablet Commonly known as:  PROTONIX Take 1 tablet (40 mg total) by mouth daily.   saccharomyces boulardii 250 MG capsule Commonly known as:  FLORASTOR Take 1 capsule (250 mg total) by mouth 2 (two) times daily.      Follow-up Information    Phillips Odor, MD On 03/22/2018.   Specialty:  Neurology Why:  at 2:45pm Contact information: [address] [place] [address] [phone]          No Known Allergies  Consultations:     Procedures/Studies: Dg Chest 2 View  Result Date: 03/16/2018 CLINICAL DATA:  Fever and weakness. EXAM: CHEST - 2 VIEW COMPARISON:  March 14, 2018 FINDINGS: Nodular density projected over the right basea is likely a nipple shadow, not seen on the March 14, 2018 comparison. No other suspicious nodules. No masses. Mild opacity in left base is favored represent atelectasis. The cardiomediastinal silhouette is unremarkable. IMPRESSION: 1. The nodule projected over the right base, not seen 2 days ago, is likely a nipple shadow. Recommend repeat imaging with nipple markers before discharge. 2. Mild opacity in left base is likely atelectasis. Electronically Signed   By: Dorise Bullion III M.D   On: 03/16/2018 13:21   Dg Chest 2 View  Result Date: 03/14/2018 CLINICAL DATA:  Confusion, cough EXAM: CHEST - 2 VIEW COMPARISON:  01/05/2018 FINDINGS: Lungs are clear.  No pleural effusion or pneumothorax. Heart is normal in size. Degenerative changes of the visualized thoracolumbar spine. IMPRESSION: No evidence of acute cardiopulmonary disease. Electronically Signed   By: Julian Hy M.D.   On: 03/14/2018 21:40   Ct Head Wo Contrast  Result Date: 03/15/2018 CLINICAL DATA:  Dementia patient with agitation and acute  confusion. EXAM: CT HEAD WITHOUT CONTRAST TECHNIQUE: Contiguous axial images were obtained from the base of the skull through the vertex without intravenous contrast. COMPARISON:  Most recent head CT 01/05/2018.  Brain MRI 11/09/2017 FINDINGS: Brain: Unchanged degree of atrophy and chronic small vessel ischemia. Encephalomalacia in the right posterior frontal lobe is unchanged. No intracranial hemorrhage, mass effect, or midline shift. No hydrocephalus. The basilar cisterns are patent. No evidence of territorial infarct or acute ischemia. No extra-axial or intracranial fluid collection. Vascular: Atherosclerosis of skullbase vasculature without hyperdense vessel or abnormal calcification. Skull: No fracture or focal lesion. Sinuses/Orbits: No acute finding. Other: None. IMPRESSION: 1.  No acute intracranial abnormality. 2. Unchanged atrophy, chronic small vessel ischemia, and remote right posterior frontal infarct. Electronically Signed   By: Keith Rake M.D.   On: 03/15/2018 02:21      Subjective: feeling better.  Still has some productive cough.  Patient is less sleepy today.  Discharge Exam: Vitals:   03/15/18 0626 03/16/18 0535 03/16/18 0728 03/16/18 1341  BP: 131/61 129/70  128/62  Pulse: 79 70  68  Resp: 18 17  18   Temp: 98.7 F (37.1 C) 98.2 F (36.8 C)  97.9 F (36.6 C)  TempSrc: Oral Oral  Oral  SpO2: 95% 95% 96% 98%  Weight:      Height:        General: Pt is alert, awake, not in acute distress Cardiovascular: RRR, S1/S2 +, no rubs, no gallops Respiratory: CTA bilaterally, no wheezing, no rhonchi Abdominal: Soft, NT, ND, bowel sounds + Extremities: no edema, no cyanosis    The results of significant diagnostics from this hospitalization (including imaging, microbiology, ancillary and laboratory) are listed below for reference.     Microbiology: Recent Results (from the past 240 hour(s))  Blood culture (routine x 2)     Status: None (Preliminary result)    Collection Time: 03/14/18  9:42 PM  Result Value Ref Range Status   Specimen Description BLOOD RIGHT HAND  Final   Special Requests   Final    BOTTLES DRAWN AEROBIC AND ANAEROBIC Blood Culture adequate volume   Culture   Final    NO GROWTH 2 DAYS Performed at [facility], [address], [place], [place] [address]    Report Status PENDING  Incomplete  Blood culture (routine x 2)     Status: None (Preliminary result)   Collection Time: 03/14/18  9:47 PM  Result Value Ref Range Status   Specimen Description LEFT ANTECUBITAL  Final   Special Requests   Final    Blood Culture results may not be optimal due to an excessive volume of blood received in culture bottles   Culture   Final    NO GROWTH 2 DAYS Performed at [facility], [address], [place], [place] [address]    Report Status PENDING  Incomplete     Labs: BNP (last 3 results) No results for input(s): BNP in the last 8760 hours. Basic Metabolic Panel: Recent Labs  Lab 03/14/18 2121 03/15/18 0535 03/16/18 0500  NA 138 141 139  K 4.0 3.7 4.0  CL 105 106 106  CO2 25 32 26  GLUCOSE 130* 111* 110*  BUN 12 11 13   CREATININE 1.07 0.92 0.86  CALCIUM 8.7* 8.8* 8.9   Liver Function Tests: Recent Labs  Lab 03/14/18 2121 03/16/18 0500  AST 16 14*  ALT 18 15  ALKPHOS 45 46  BILITOT 0.8 0.5  PROT 6.7 6.4*  ALBUMIN 3.6 3.4*   No results for input(s): LIPASE, AMYLASE in the last 168 hours. Recent Labs  Lab 03/15/18 1836  AMMONIA 46*   CBC: Recent Labs  Lab 03/14/18 2121 03/15/18 0535 03/16/18 0500  WBC 11.1* 10.1 8.6  HGB 11.4* 11.1* 11.3*  HCT 35.5* 34.6* 34.2*  MCV 94.9 96.1 95.0  PLT 209 197 198   Cardiac Enzymes: No results for input(s): CKTOTAL, CKMB, CKMBINDEX, TROPONINI in the last 168 hours. BNP: Invalid input(s): POCBNP CBG: Recent Labs  Lab 03/15/18 1254 03/15/18 1632 03/15/18 2106 03/16/18 0756 03/16/18 1200  GLUCAP 128* 140* 99 103* 123*   D-Dimer No results for input(s): DDIMER  in the last 72 hours. Hgb A1c No results for input(s): HGBA1C in the last 72 hours. Lipid Profile No results for input(s): CHOL, HDL, LDLCALC, TRIG, CHOLHDL, LDLDIRECT in the last 72 hours. Thyroid function studies No results for input(s): TSH, T4TOTAL, T3FREE, THYROIDAB in the last 72 hours.  Invalid input(s): FREET3 Anemia work up No results for input(s): VITAMINB12, FOLATE, FERRITIN, TIBC,  IRON, RETICCTPCT in the last 72 hours. Urinalysis    Component Value Date/Time   COLORURINE YELLOW 03/14/2018 2103   APPEARANCEUR CLEAR 03/14/2018 2103   LABSPEC 1.012 03/14/2018 2103   PHURINE 7.0 03/14/2018 2103   GLUCOSEU NEGATIVE 03/14/2018 2103   HGBUR NEGATIVE 03/14/2018 2103   BILIRUBINUR NEGATIVE 03/14/2018 2103   KETONESUR NEGATIVE 03/14/2018 2103   PROTEINUR NEGATIVE 03/14/2018 2103   NITRITE NEGATIVE 03/14/2018 2103   LEUKOCYTESUR NEGATIVE 03/14/2018 2103   Sepsis Labs Invalid input(s): PROCALCITONIN,  WBC,  LACTICIDVEN Microbiology Recent Results (from the past 240 hour(s))  Blood culture (routine x 2)     Status: None (Preliminary result)   Collection Time: 03/14/18  9:42 PM  Result Value Ref Range Status   Specimen Description BLOOD RIGHT HAND  Final   Special Requests   Final    BOTTLES DRAWN AEROBIC AND ANAEROBIC Blood Culture adequate volume   Culture   Final    NO GROWTH 2 DAYS Performed at [facility], [address] [place], [place] [address]    Report Status PENDING  Incomplete  Blood culture (routine x 2)     Status: None (Preliminary result)   Collection Time: 03/14/18  9:47 PM  Result Value Ref Range Status   Specimen Description LEFT ANTECUBITAL  Final   Special Requests   Final    Blood Culture results may not be optimal due to an excessive volume of blood received in culture bottles   Culture   Final    NO GROWTH 2 DAYS Performed at [facility], [address] [place], [place] [address]    Report Status PENDING  Incomplete     Time coordinating  discharge: 32mins  SIGNED:   Kathie Dike, MD  [facility] 03/16/2018, 2:28 PM Pager   If 7PM-7AM, please contact night-coverage [email or web address] Password [email or web address]

## 2018-03-16 NOTE — [place] FL2 (Signed)
[place] MEDICAID FL2 LEVEL OF CARE SCREENING TOOL     IDENTIFICATION  Patient Name: Jacob Little Birthdate: 17-Mar-1936 Sex: male Admission Date (Current Location): 03/14/2018  [facility] and [place] Number:  [employer] and Address:  [facility] [address], [place]      Provider Number: [id number]  Attending Physician Name and Address:  Kathie Dike, MD  Relative Name and Phone Number:       Current Level of Care: Other (Comment)(observation) Recommended Level of Care: [place] Prior Approval Number:    Date Approved/Denied:   PASRR Number:    Discharge Plan:      Current Diagnoses: Patient Active Problem List   Diagnosis Date Noted  . Viral bronchitis 03/16/2018  . Tremor of left hand 03/16/2018  . Diabetes 1.5, managed as type 2 ([place]) 03/14/2018  . Fever with Metabolic encephalopathy 56/43/3295  . Acute metabolic encephalopathy 18/84/1660  . Dehydration   . Orthostatic hypotension   . Generalized weakness 01/05/2018  . Acute renal failure superimposed on stage 3 chronic kidney disease ([place]) 01/05/2018  . Lactic acidosis 01/05/2018  . Diarrhea 01/05/2018  . Vascular dementia with behavior disturbance ([place])   . Acute embolic stroke ([place]) 63/06/6008  . Encephalopathy 11/08/2017  . Acute ischemic stroke ([place]) 11/08/2017  . Essential hypertension 11/08/2017  . Diabetes mellitus with hyperglycemia, without long-term current use of insulin ([place]) 11/08/2017  . Acute ischemic left MCA stroke ([place]) 11/08/2017  . DENTAL PAIN 05/12/2010  . CORONARY ATHEROSCLEROSIS NATIVE CORONARY ARTERY 07/22/2009  . DM 07/12/2009  . HLD (hyperlipidemia) 07/12/2009  . HYPERTENSION 07/12/2009  . ANGINA, ATYPICAL 07/12/2009    Orientation RESPIRATION BLADDER Height & Weight     Self, Time, Situation  Normal Continent Weight: 158 lb 4.6 oz (71.8 kg) Height:  5\' 5"  (165.1 cm)  BEHAVIORAL SYMPTOMS/MOOD NEUROLOGICAL BOWEL  NUTRITION STATUS      Continent (low sodium, heart healthy/carb modified.  (At facility, patient is on a no concentrated sweets diet). )  AMBULATORY STATUS COMMUNICATION OF NEEDS Skin   Extensive Assist(uses walker and wheelchair at baseline)   Normal                       Personal Care Assistance Level of Assistance  Bathing, Feeding, Dressing Bathing Assistance: Limited assistance Feeding assistance: Independent Dressing Assistance: Limited assistance     Functional Limitations Info  Hearing, Sight, Speech Sight Info: Adequate Hearing Info: Adequate Speech Info: Adequate    SPECIAL CARE FACTORS FREQUENCY                       Contractures Contractures Info: Not present    Additional Factors Info  Code Status, Allergies Code Status Info: Full Code Allergies Info: NKA           Current Medications (03/16/2018):  This is the current hospital active medication list Current Facility-Administered Medications  Medication Dose Route Frequency Provider Last Rate Last Dose  . 0.9 %  sodium chloride infusion  250 mL Intravenous PRN Emokpae, Courage, MD      . 0.9 %  sodium chloride infusion   Intravenous Continuous Kathie Dike, MD 75 mL/hr at 03/16/18 1246    . acetaminophen (TYLENOL) tablet 650 mg  650 mg Oral Q6H PRN Emokpae, Courage, MD       Or  . acetaminophen (TYLENOL) suppository 650 mg  650 mg Rectal Q6H PRN Roxan Hockey, MD      .  acetaminophen (TYLENOL) tablet 650 mg  650 mg Oral Q4H PRN Emokpae, Courage, MD      . albuterol (PROVENTIL) (2.5 MG/3ML) 0.083% nebulizer solution 2.5 mg  2.5 mg Nebulization Q2H PRN Emokpae, Courage, MD      . aspirin EC tablet 325 mg  325 mg Oral Daily Emokpae, Courage, MD   325 mg at 03/16/18 0904  . atorvastatin (LIPITOR) tablet 20 mg  20 mg Oral Daily Emokpae, Courage, MD   20 mg at 03/16/18 0906  . cyanocobalamin ((VITAMIN B-12)) injection 1,000 mcg  1,000 mcg Intramuscular Q30 days Emokpae, Courage, MD      .  divalproex (DEPAKOTE) DR tablet 250 mg  250 mg Oral Q8H Emokpae, Courage, MD   250 mg at 03/16/18 1419  . guaiFENesin (MUCINEX) 12 hr tablet 1,200 mg  1,200 mg Oral BID Kathie Dike, MD   1,200 mg at 03/16/18 0904  . heparin injection 5,000 Units  5,000 Units Subcutaneous Q8H Emokpae, Courage, MD   5,000 Units at 03/16/18 1419  . insulin aspart (novoLOG) injection 0-9 Units  0-9 Units Subcutaneous TID WC Roxan Hockey, MD   1 Units at 03/16/18 1247  . ipratropium-albuterol (DUONEB) 0.5-2.5 (3) MG/3ML nebulizer solution 3 mL  3 mL Nebulization Q6H Kathie Dike, MD   3 mL at 03/16/18 0726  . isosorbide mononitrate (IMDUR) 24 hr tablet 15 mg  15 mg Oral BID Roxan Hockey, MD   15 mg at 03/16/18 0905  . LORazepam (ATIVAN) injection 0.5 mg  0.5 mg Intravenous Q6H PRN D[place] Brick, Courage, MD   0.5 mg at 03/15/18 0249  . metoprolol tartrate (LOPRESSOR) tablet 12.5 mg  12.5 mg Oral BID Denton Brick, Courage, MD   12.5 mg at 03/16/18 0905  . nitroGLYCERIN (NITROSTAT) SL tablet 0.4 mg  0.4 mg Sublingual Q5 min PRN Emokpae, Courage, MD      . ondansetron (ZOFRAN) tablet 4 mg  4 mg Oral Q6H PRN Emokpae, Courage, MD       Or  . ondansetron (ZOFRAN) injection 4 mg  4 mg Intravenous Q6H PRN Emokpae, Courage, MD      . pantoprazole (PROTONIX) EC tablet 40 mg  40 mg Oral Daily Emokpae, Courage, MD   40 mg at 03/16/18 0906  . polyethylene glycol (MIRALAX / GLYCOLAX) packet 17 g  17 g Oral Daily PRN Emokpae, Courage, MD      . saccharomyces boulardii (FLORASTOR) capsule 250 mg  250 mg Oral BID D[place] Brick, Courage, MD   250 mg at 03/16/18 0903  . sodium chloride flush (NS) 0.9 % injection 3 mL  3 mL Intravenous Q12H Emokpae, Courage, MD   3 mL at 03/16/18 1156  . sodium chloride flush (NS) 0.9 % injection 3 mL  3 mL Intravenous PRN Emokpae, Courage, MD      . traZODone (DESYREL) tablet 50 mg  50 mg Oral QHS PRN Roxan Hockey, MD   50 mg at 03/15/18 0122     Discharge Medications: Medication List    TAKE  these medications   acetaminophen 325 MG tablet Commonly known as:  TYLENOL Take 2 tablets (650 mg total) by mouth every 4 (four) hours as needed for mild pain (or temp > 37.5 C (99.5 F)).   albuterol 108 (90 Base) MCG/ACT inhaler Commonly known as:  PROVENTIL HFA;VENTOLIN HFA Inhale 2 puffs into the lungs every 6 (six) hours as needed for wheezing or shortness of breath.   aspirin EC 325 MG tablet Take 325 mg by mouth daily.  atorvastatin 20 MG tablet Commonly known as:  LIPITOR Take 20 mg by mouth daily.   cyanocobalamin 1000 MCG/ML injection Commonly known as:  (VITAMIN B-12) Inject 1,000 mcg into the muscle every 30 (thirty) days.   divalproex 250 MG DR tablet Commonly known as:  DEPAKOTE Take 1 tablet (250 mg total) by mouth every 8 (eight) hours.   glimepiride 1 MG tablet Commonly known as:  AMARYL Take 1 tablet (1 mg total) by mouth every morning.   guaiFENesin 600 MG 12 hr tablet Commonly known as:  MUCINEX Take 1 tablet (600 mg total) by mouth 2 (two) times daily.   isosorbide mononitrate 30 MG 24 hr tablet Commonly known as:  IMDUR Take 0.5 tablets (15 mg total) by mouth 2 (two) times daily.   metoprolol tartrate 25 MG tablet Commonly known as:  LOPRESSOR Take 0.5 tablets (12.5 mg total) by mouth 2 (two) times daily.   nitroGLYCERIN 0.4 MG SL tablet Commonly known as:  NITROSTAT Place 0.4 mg under the tongue every 5 (five) minutes as needed for chest pain.   OZEMPIC (0.25 OR 0.5 MG/DOSE) 2 MG/1.5ML Sopn Generic drug:  Semaglutide(0.25 or 0.5MG /DOS) Inject 0.25 mg into the skin once a week.   pantoprazole 40 MG tablet Commonly known as:  PROTONIX Take 1 tablet (40 mg total) by mouth daily.   saccharomyces boulardii 250 MG capsule Commonly known as:  FLORASTOR Take 1 capsule (250 mg total) by mouth 2 (two) times daily.    .  Relevant Imaging Results:  Relevant Lab Results:   Additional Information SSN: [id number]  Ihor Gully, LCSW

## 2018-03-16 NOTE — Progress Notes (Signed)
IV removed, 2x2 gauze and paper tape applied to site, patient tolerated well.  AVS given to [facility] staff member who transported patient back to [facility].

## 2018-03-19 LAB — CULTURE, BLOOD (ROUTINE X 2)
CULTURE: NO GROWTH
Culture: NO GROWTH
Special Requests: ADEQUATE

## 2018-03-25 DIAGNOSIS — J209 Acute bronchitis, unspecified: Secondary | ICD-10-CM | POA: Diagnosis not present

## 2018-03-25 DIAGNOSIS — E860 Dehydration: Secondary | ICD-10-CM | POA: Diagnosis not present

## 2018-03-25 DIAGNOSIS — Z23 Encounter for immunization: Secondary | ICD-10-CM | POA: Diagnosis not present

## 2018-03-29 DIAGNOSIS — F0150 Vascular dementia without behavioral disturbance: Secondary | ICD-10-CM | POA: Diagnosis not present

## 2018-03-29 DIAGNOSIS — I639 Cerebral infarction, unspecified: Secondary | ICD-10-CM | POA: Diagnosis not present

## 2018-04-04 DIAGNOSIS — Z79899 Other long term (current) drug therapy: Secondary | ICD-10-CM | POA: Diagnosis not present

## 2018-04-04 DIAGNOSIS — G252 Other specified forms of tremor: Secondary | ICD-10-CM | POA: Diagnosis not present

## 2018-04-04 DIAGNOSIS — G40209 Localization-related (focal) (partial) symptomatic epilepsy and epileptic syndromes with complex partial seizures, not intractable, without status epilepticus: Secondary | ICD-10-CM | POA: Diagnosis not present

## 2018-04-04 DIAGNOSIS — I680 Cerebral amyloid angiopathy: Secondary | ICD-10-CM | POA: Diagnosis not present

## 2018-04-05 DIAGNOSIS — F0150 Vascular dementia without behavioral disturbance: Secondary | ICD-10-CM | POA: Diagnosis not present

## 2018-04-05 DIAGNOSIS — I639 Cerebral infarction, unspecified: Secondary | ICD-10-CM | POA: Diagnosis not present

## 2018-04-07 DIAGNOSIS — I639 Cerebral infarction, unspecified: Secondary | ICD-10-CM | POA: Diagnosis not present

## 2018-04-07 DIAGNOSIS — F0150 Vascular dementia without behavioral disturbance: Secondary | ICD-10-CM | POA: Diagnosis not present

## 2018-04-12 DIAGNOSIS — F0150 Vascular dementia without behavioral disturbance: Secondary | ICD-10-CM | POA: Diagnosis not present

## 2018-04-12 DIAGNOSIS — I639 Cerebral infarction, unspecified: Secondary | ICD-10-CM | POA: Diagnosis not present

## 2018-04-16 DIAGNOSIS — I639 Cerebral infarction, unspecified: Secondary | ICD-10-CM | POA: Diagnosis not present

## 2018-04-16 DIAGNOSIS — F0150 Vascular dementia without behavioral disturbance: Secondary | ICD-10-CM | POA: Diagnosis not present

## 2018-04-18 DIAGNOSIS — I639 Cerebral infarction, unspecified: Secondary | ICD-10-CM | POA: Diagnosis not present

## 2018-04-18 DIAGNOSIS — F0150 Vascular dementia without behavioral disturbance: Secondary | ICD-10-CM | POA: Diagnosis not present

## 2018-04-19 DIAGNOSIS — G4089 Other seizures: Secondary | ICD-10-CM | POA: Diagnosis not present

## 2018-04-19 DIAGNOSIS — R413 Other amnesia: Secondary | ICD-10-CM | POA: Diagnosis not present

## 2018-04-19 DIAGNOSIS — D649 Anemia, unspecified: Secondary | ICD-10-CM | POA: Diagnosis not present

## 2018-04-19 DIAGNOSIS — Z79899 Other long term (current) drug therapy: Secondary | ICD-10-CM | POA: Diagnosis not present

## 2018-04-19 DIAGNOSIS — E119 Type 2 diabetes mellitus without complications: Secondary | ICD-10-CM | POA: Diagnosis not present

## 2018-04-21 DIAGNOSIS — M6281 Muscle weakness (generalized): Secondary | ICD-10-CM | POA: Diagnosis not present

## 2018-04-25 DIAGNOSIS — M6281 Muscle weakness (generalized): Secondary | ICD-10-CM | POA: Diagnosis not present

## 2018-04-26 DIAGNOSIS — F0150 Vascular dementia without behavioral disturbance: Secondary | ICD-10-CM | POA: Diagnosis not present

## 2018-04-26 DIAGNOSIS — E785 Hyperlipidemia, unspecified: Secondary | ICD-10-CM | POA: Diagnosis not present

## 2018-04-26 DIAGNOSIS — I639 Cerebral infarction, unspecified: Secondary | ICD-10-CM | POA: Diagnosis not present

## 2018-04-26 DIAGNOSIS — E1159 Type 2 diabetes mellitus with other circulatory complications: Secondary | ICD-10-CM | POA: Diagnosis not present

## 2018-04-28 DIAGNOSIS — I639 Cerebral infarction, unspecified: Secondary | ICD-10-CM | POA: Diagnosis not present

## 2018-04-28 DIAGNOSIS — F0150 Vascular dementia without behavioral disturbance: Secondary | ICD-10-CM | POA: Diagnosis not present

## 2018-04-29 DIAGNOSIS — M6281 Muscle weakness (generalized): Secondary | ICD-10-CM | POA: Diagnosis not present

## 2018-05-02 DIAGNOSIS — M6281 Muscle weakness (generalized): Secondary | ICD-10-CM | POA: Diagnosis not present

## 2018-05-03 DIAGNOSIS — I639 Cerebral infarction, unspecified: Secondary | ICD-10-CM | POA: Diagnosis not present

## 2018-05-03 DIAGNOSIS — F0150 Vascular dementia without behavioral disturbance: Secondary | ICD-10-CM | POA: Diagnosis not present

## 2018-05-05 DIAGNOSIS — F0150 Vascular dementia without behavioral disturbance: Secondary | ICD-10-CM | POA: Diagnosis not present

## 2018-05-05 DIAGNOSIS — I639 Cerebral infarction, unspecified: Secondary | ICD-10-CM | POA: Diagnosis not present

## 2018-05-05 DIAGNOSIS — M6281 Muscle weakness (generalized): Secondary | ICD-10-CM | POA: Diagnosis not present

## 2018-05-09 DIAGNOSIS — M6281 Muscle weakness (generalized): Secondary | ICD-10-CM | POA: Diagnosis not present

## 2018-05-10 DIAGNOSIS — Z961 Presence of intraocular lens: Secondary | ICD-10-CM | POA: Diagnosis not present

## 2018-05-10 DIAGNOSIS — I639 Cerebral infarction, unspecified: Secondary | ICD-10-CM | POA: Diagnosis not present

## 2018-05-10 DIAGNOSIS — F0150 Vascular dementia without behavioral disturbance: Secondary | ICD-10-CM | POA: Diagnosis not present

## 2018-05-10 DIAGNOSIS — E119 Type 2 diabetes mellitus without complications: Secondary | ICD-10-CM | POA: Diagnosis not present

## 2018-05-11 DIAGNOSIS — I639 Cerebral infarction, unspecified: Secondary | ICD-10-CM | POA: Diagnosis not present

## 2018-05-11 DIAGNOSIS — F0150 Vascular dementia without behavioral disturbance: Secondary | ICD-10-CM | POA: Diagnosis not present

## 2018-05-12 DIAGNOSIS — M6281 Muscle weakness (generalized): Secondary | ICD-10-CM | POA: Diagnosis not present

## 2018-05-16 DIAGNOSIS — M6281 Muscle weakness (generalized): Secondary | ICD-10-CM | POA: Diagnosis not present

## 2018-05-17 DIAGNOSIS — I639 Cerebral infarction, unspecified: Secondary | ICD-10-CM | POA: Diagnosis not present

## 2018-05-17 DIAGNOSIS — F0150 Vascular dementia without behavioral disturbance: Secondary | ICD-10-CM | POA: Diagnosis not present

## 2018-05-19 DIAGNOSIS — F0150 Vascular dementia without behavioral disturbance: Secondary | ICD-10-CM | POA: Diagnosis not present

## 2018-05-19 DIAGNOSIS — M6281 Muscle weakness (generalized): Secondary | ICD-10-CM | POA: Diagnosis not present

## 2018-05-19 DIAGNOSIS — I639 Cerebral infarction, unspecified: Secondary | ICD-10-CM | POA: Diagnosis not present

## 2018-05-23 DIAGNOSIS — M6281 Muscle weakness (generalized): Secondary | ICD-10-CM | POA: Diagnosis not present

## 2018-05-24 DIAGNOSIS — F0150 Vascular dementia without behavioral disturbance: Secondary | ICD-10-CM | POA: Diagnosis not present

## 2018-05-24 DIAGNOSIS — I639 Cerebral infarction, unspecified: Secondary | ICD-10-CM | POA: Diagnosis not present

## 2018-05-26 DIAGNOSIS — I639 Cerebral infarction, unspecified: Secondary | ICD-10-CM | POA: Diagnosis not present

## 2018-05-26 DIAGNOSIS — M6281 Muscle weakness (generalized): Secondary | ICD-10-CM | POA: Diagnosis not present

## 2018-05-26 DIAGNOSIS — F0150 Vascular dementia without behavioral disturbance: Secondary | ICD-10-CM | POA: Diagnosis not present

## 2018-05-30 DIAGNOSIS — M6281 Muscle weakness (generalized): Secondary | ICD-10-CM | POA: Diagnosis not present

## 2018-05-31 DIAGNOSIS — F0150 Vascular dementia without behavioral disturbance: Secondary | ICD-10-CM | POA: Diagnosis not present

## 2018-05-31 DIAGNOSIS — I639 Cerebral infarction, unspecified: Secondary | ICD-10-CM | POA: Diagnosis not present

## 2018-06-02 DIAGNOSIS — M6281 Muscle weakness (generalized): Secondary | ICD-10-CM | POA: Diagnosis not present

## 2018-06-06 DIAGNOSIS — M6281 Muscle weakness (generalized): Secondary | ICD-10-CM | POA: Diagnosis not present

## 2018-06-09 DIAGNOSIS — M6281 Muscle weakness (generalized): Secondary | ICD-10-CM | POA: Diagnosis not present

## 2018-06-13 DIAGNOSIS — M6281 Muscle weakness (generalized): Secondary | ICD-10-CM | POA: Diagnosis not present

## 2018-07-20 DIAGNOSIS — E1122 Type 2 diabetes mellitus with diabetic chronic kidney disease: Secondary | ICD-10-CM | POA: Diagnosis not present

## 2018-07-27 DIAGNOSIS — G4089 Other seizures: Secondary | ICD-10-CM | POA: Diagnosis not present

## 2018-07-27 DIAGNOSIS — F0150 Vascular dementia without behavioral disturbance: Secondary | ICD-10-CM | POA: Diagnosis not present

## 2018-07-27 DIAGNOSIS — E1159 Type 2 diabetes mellitus with other circulatory complications: Secondary | ICD-10-CM | POA: Diagnosis not present

## 2018-08-02 DIAGNOSIS — R2689 Other abnormalities of gait and mobility: Secondary | ICD-10-CM | POA: Diagnosis not present

## 2018-08-02 DIAGNOSIS — M6281 Muscle weakness (generalized): Secondary | ICD-10-CM | POA: Diagnosis not present

## 2018-08-02 DIAGNOSIS — R296 Repeated falls: Secondary | ICD-10-CM | POA: Diagnosis not present

## 2018-08-09 DIAGNOSIS — R296 Repeated falls: Secondary | ICD-10-CM | POA: Diagnosis not present

## 2018-08-09 DIAGNOSIS — M6281 Muscle weakness (generalized): Secondary | ICD-10-CM | POA: Diagnosis not present

## 2018-08-09 DIAGNOSIS — R2689 Other abnormalities of gait and mobility: Secondary | ICD-10-CM | POA: Diagnosis not present

## 2018-08-11 DIAGNOSIS — R296 Repeated falls: Secondary | ICD-10-CM | POA: Diagnosis not present

## 2018-08-11 DIAGNOSIS — R2689 Other abnormalities of gait and mobility: Secondary | ICD-10-CM | POA: Diagnosis not present

## 2018-08-11 DIAGNOSIS — M6281 Muscle weakness (generalized): Secondary | ICD-10-CM | POA: Diagnosis not present

## 2018-08-16 DIAGNOSIS — R2689 Other abnormalities of gait and mobility: Secondary | ICD-10-CM | POA: Diagnosis not present

## 2018-08-16 DIAGNOSIS — M6281 Muscle weakness (generalized): Secondary | ICD-10-CM | POA: Diagnosis not present

## 2018-08-16 DIAGNOSIS — R296 Repeated falls: Secondary | ICD-10-CM | POA: Diagnosis not present

## 2018-08-18 DIAGNOSIS — R2689 Other abnormalities of gait and mobility: Secondary | ICD-10-CM | POA: Diagnosis not present

## 2018-08-18 DIAGNOSIS — R296 Repeated falls: Secondary | ICD-10-CM | POA: Diagnosis not present

## 2018-08-18 DIAGNOSIS — M6281 Muscle weakness (generalized): Secondary | ICD-10-CM | POA: Diagnosis not present

## 2018-08-23 DIAGNOSIS — M6281 Muscle weakness (generalized): Secondary | ICD-10-CM | POA: Diagnosis not present

## 2018-08-23 DIAGNOSIS — R2689 Other abnormalities of gait and mobility: Secondary | ICD-10-CM | POA: Diagnosis not present

## 2018-08-23 DIAGNOSIS — R296 Repeated falls: Secondary | ICD-10-CM | POA: Diagnosis not present

## 2018-08-25 DIAGNOSIS — R296 Repeated falls: Secondary | ICD-10-CM | POA: Diagnosis not present

## 2018-08-25 DIAGNOSIS — M6281 Muscle weakness (generalized): Secondary | ICD-10-CM | POA: Diagnosis not present

## 2018-08-25 DIAGNOSIS — R2689 Other abnormalities of gait and mobility: Secondary | ICD-10-CM | POA: Diagnosis not present

## 2018-08-27 ENCOUNTER — Emergency Department (HOSPITAL_COMMUNITY)
Admission: EM | Admit: 2018-08-27 | Discharge: 2018-08-27 | Disposition: A | Discharge status: Skilled Nursing Facility | Payer: PPO | Attending: Emergency Medicine | Admitting: Emergency Medicine

## 2018-08-27 ENCOUNTER — Other Ambulatory Visit: Payer: Self-pay

## 2018-08-27 ENCOUNTER — Encounter (HOSPITAL_COMMUNITY): Payer: Self-pay | Admitting: Emergency Medicine

## 2018-08-27 DIAGNOSIS — N183 Chronic kidney disease, stage 3 (moderate): Secondary | ICD-10-CM | POA: Insufficient documentation

## 2018-08-27 DIAGNOSIS — Z87891 Personal history of nicotine dependence: Secondary | ICD-10-CM | POA: Diagnosis not present

## 2018-08-27 DIAGNOSIS — Z7984 Long term (current) use of oral hypoglycemic drugs: Secondary | ICD-10-CM | POA: Diagnosis not present

## 2018-08-27 DIAGNOSIS — I259 Chronic ischemic heart disease, unspecified: Secondary | ICD-10-CM | POA: Diagnosis not present

## 2018-08-27 DIAGNOSIS — Z7982 Long term (current) use of aspirin: Secondary | ICD-10-CM | POA: Diagnosis not present

## 2018-08-27 DIAGNOSIS — E1122 Type 2 diabetes mellitus with diabetic chronic kidney disease: Secondary | ICD-10-CM | POA: Diagnosis not present

## 2018-08-27 DIAGNOSIS — F0151 Vascular dementia with behavioral disturbance: Secondary | ICD-10-CM | POA: Insufficient documentation

## 2018-08-27 DIAGNOSIS — F01518 Vascular dementia, unspecified severity, with other behavioral disturbance: Secondary | ICD-10-CM

## 2018-08-27 DIAGNOSIS — R4689 Other symptoms and signs involving appearance and behavior: Secondary | ICD-10-CM | POA: Diagnosis present

## 2018-08-27 DIAGNOSIS — E119 Type 2 diabetes mellitus without complications: Secondary | ICD-10-CM | POA: Diagnosis not present

## 2018-08-27 DIAGNOSIS — I129 Hypertensive chronic kidney disease with stage 1 through stage 4 chronic kidney disease, or unspecified chronic kidney disease: Secondary | ICD-10-CM | POA: Diagnosis not present

## 2018-08-27 DIAGNOSIS — Z79899 Other long term (current) drug therapy: Secondary | ICD-10-CM | POA: Insufficient documentation

## 2018-08-27 LAB — CBC WITH DIFFERENTIAL/PLATELET
Abs Immature Granulocytes: 0.17 10*3/uL — ABNORMAL HIGH (ref 0.00–0.07)
Basophils Absolute: 0.1 10*3/uL (ref 0.0–0.1)
Basophils Relative: 1 %
EOS ABS: 0.2 10*3/uL (ref 0.0–0.5)
Eosinophils Relative: 2 %
HEMATOCRIT: 43 % (ref 39.0–52.0)
HEMOGLOBIN: 14.3 g/dL (ref 13.0–17.0)
Immature Granulocytes: 2 %
LYMPHS ABS: 2.5 10*3/uL (ref 0.7–4.0)
LYMPHS PCT: 21 %
MCH: 31 pg (ref 26.0–34.0)
MCHC: 33.3 g/dL (ref 30.0–36.0)
MCV: 93.1 fL (ref 80.0–100.0)
MONO ABS: 1.1 10*3/uL — AB (ref 0.1–1.0)
MONOS PCT: 10 %
Neutro Abs: 7.6 10*3/uL (ref 1.7–7.7)
Neutrophils Relative %: 64 %
Platelets: 291 10*3/uL (ref 150–400)
RBC: 4.62 MIL/uL (ref 4.22–5.81)
RDW: 12.9 % (ref 11.5–15.5)
WBC: 11.6 10*3/uL — ABNORMAL HIGH (ref 4.0–10.5)
nRBC: 0 % (ref 0.0–0.2)

## 2018-08-27 LAB — COMPREHENSIVE METABOLIC PANEL
ALBUMIN: 4.4 g/dL (ref 3.5–5.0)
ALK PHOS: 73 U/L (ref 38–126)
ALT: 20 U/L (ref 0–44)
ANION GAP: 8 (ref 5–15)
AST: 17 U/L (ref 15–41)
BILIRUBIN TOTAL: 0.7 mg/dL (ref 0.3–1.2)
BUN: 13 mg/dL (ref 8–23)
CALCIUM: 9.2 mg/dL (ref 8.9–10.3)
CO2: 26 mmol/L (ref 22–32)
Chloride: 102 mmol/L (ref 98–111)
Creatinine, Ser: 1.03 mg/dL (ref 0.61–1.24)
GLUCOSE: 222 mg/dL — AB (ref 70–99)
Potassium: 4.1 mmol/L (ref 3.5–5.1)
Sodium: 136 mmol/L (ref 135–145)
TOTAL PROTEIN: 7.4 g/dL (ref 6.5–8.1)

## 2018-08-27 LAB — URINALYSIS, ROUTINE W REFLEX MICROSCOPIC
BILIRUBIN URINE: NEGATIVE
Bacteria, UA: NONE SEEN
HGB URINE DIPSTICK: NEGATIVE
Ketones, ur: NEGATIVE mg/dL
Leukocytes,Ua: NEGATIVE
Nitrite: NEGATIVE
Protein, ur: NEGATIVE mg/dL
SPECIFIC GRAVITY, URINE: 1.018 (ref 1.005–1.030)
pH: 5 (ref 5.0–8.0)

## 2018-08-27 LAB — RAPID URINE DRUG SCREEN, HOSP PERFORMED
AMPHETAMINES: NOT DETECTED
Barbiturates: NOT DETECTED
Benzodiazepines: NOT DETECTED
Cocaine: NOT DETECTED
Opiates: NOT DETECTED
TETRAHYDROCANNABINOL: NOT DETECTED

## 2018-08-27 LAB — ETHANOL

## 2018-08-27 MED ORDER — VANCOMYCIN HCL IN DEXTROSE 1-5 GM/200ML-% IV SOLN
INTRAVENOUS | Status: AC
  Filled 2018-08-27: qty 200
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
## 2018-08-27 NOTE — ED Notes (Signed)
Patient cooperative with changing into paper scrubs, cash ($60.00) secured by security.  Patient wanded.
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
## 2018-08-27 NOTE — Discharge Instructions (Addendum)
Patient should follow-up with a primary care doctor next week if there is any problems.  Patient was seen by behavioral health and it was determined the patient did not meet criteria to be admitted to psychiatric hospital for his dementia and aggressive behavior

## 2018-08-27 NOTE — ED Notes (Signed)
TTS consult in progress. °

## 2018-08-27 NOTE — ED Notes (Signed)
Offered meal tray, pt states he is "too upset to eat."

## 2018-08-27 NOTE — ED Notes (Signed)
Spoke to "Tammy" at [employer] updated on patient status and that he is ready to be picked up.  Staff said they will after they make "some phone calls".

## 2018-10-26 DIAGNOSIS — F0150 Vascular dementia without behavioral disturbance: Secondary | ICD-10-CM | POA: Diagnosis not present

## 2018-10-26 DIAGNOSIS — E1129 Type 2 diabetes mellitus with other diabetic kidney complication: Secondary | ICD-10-CM | POA: Diagnosis not present

## 2018-10-26 DIAGNOSIS — D649 Anemia, unspecified: Secondary | ICD-10-CM | POA: Diagnosis not present

## 2019-01-25 DIAGNOSIS — R2689 Other abnormalities of gait and mobility: Secondary | ICD-10-CM | POA: Diagnosis not present

## 2019-01-25 DIAGNOSIS — M6281 Muscle weakness (generalized): Secondary | ICD-10-CM | POA: Diagnosis not present

## 2019-01-25 DIAGNOSIS — R296 Repeated falls: Secondary | ICD-10-CM | POA: Diagnosis not present

## 2019-01-30 DIAGNOSIS — M6281 Muscle weakness (generalized): Secondary | ICD-10-CM | POA: Diagnosis not present

## 2019-01-30 DIAGNOSIS — R2689 Other abnormalities of gait and mobility: Secondary | ICD-10-CM | POA: Diagnosis not present

## 2019-01-30 DIAGNOSIS — R296 Repeated falls: Secondary | ICD-10-CM | POA: Diagnosis not present

## 2019-02-01 DIAGNOSIS — M6281 Muscle weakness (generalized): Secondary | ICD-10-CM | POA: Diagnosis not present

## 2019-02-01 DIAGNOSIS — R296 Repeated falls: Secondary | ICD-10-CM | POA: Diagnosis not present

## 2019-02-01 DIAGNOSIS — R2689 Other abnormalities of gait and mobility: Secondary | ICD-10-CM | POA: Diagnosis not present

## 2019-02-06 DIAGNOSIS — R2689 Other abnormalities of gait and mobility: Secondary | ICD-10-CM | POA: Diagnosis not present

## 2019-02-06 DIAGNOSIS — M6281 Muscle weakness (generalized): Secondary | ICD-10-CM | POA: Diagnosis not present

## 2019-02-06 DIAGNOSIS — R296 Repeated falls: Secondary | ICD-10-CM | POA: Diagnosis not present

## 2019-02-08 DIAGNOSIS — R2689 Other abnormalities of gait and mobility: Secondary | ICD-10-CM | POA: Diagnosis not present

## 2019-02-08 DIAGNOSIS — R296 Repeated falls: Secondary | ICD-10-CM | POA: Diagnosis not present

## 2019-02-08 DIAGNOSIS — M6281 Muscle weakness (generalized): Secondary | ICD-10-CM | POA: Diagnosis not present

## 2019-02-13 DIAGNOSIS — M6281 Muscle weakness (generalized): Secondary | ICD-10-CM | POA: Diagnosis not present

## 2019-02-13 DIAGNOSIS — R296 Repeated falls: Secondary | ICD-10-CM | POA: Diagnosis not present

## 2019-02-13 DIAGNOSIS — R2689 Other abnormalities of gait and mobility: Secondary | ICD-10-CM | POA: Diagnosis not present

## 2019-02-15 DIAGNOSIS — R296 Repeated falls: Secondary | ICD-10-CM | POA: Diagnosis not present

## 2019-02-15 DIAGNOSIS — M6281 Muscle weakness (generalized): Secondary | ICD-10-CM | POA: Diagnosis not present

## 2019-02-15 DIAGNOSIS — R2689 Other abnormalities of gait and mobility: Secondary | ICD-10-CM | POA: Diagnosis not present

## 2019-02-24 ENCOUNTER — Other Ambulatory Visit: Payer: Self-pay

## 2019-02-24 ENCOUNTER — Emergency Department (HOSPITAL_COMMUNITY)
Admission: EM | Admit: 2019-02-24 | Discharge: 2019-02-24 | Disposition: A | Discharge status: Skilled Nursing Facility | Payer: PPO | Attending: Emergency Medicine | Admitting: Emergency Medicine

## 2019-02-24 ENCOUNTER — Encounter (HOSPITAL_COMMUNITY): Payer: Self-pay | Admitting: Emergency Medicine

## 2019-02-24 ENCOUNTER — Emergency Department (HOSPITAL_COMMUNITY): Payer: PPO

## 2019-02-24 DIAGNOSIS — I10 Essential (primary) hypertension: Secondary | ICD-10-CM | POA: Diagnosis not present

## 2019-02-24 DIAGNOSIS — Z8673 Personal history of transient ischemic attack (TIA), and cerebral infarction without residual deficits: Secondary | ICD-10-CM | POA: Diagnosis not present

## 2019-02-24 DIAGNOSIS — R140 Abdominal distension (gaseous): Secondary | ICD-10-CM | POA: Insufficient documentation

## 2019-02-24 DIAGNOSIS — Z87891 Personal history of nicotine dependence: Secondary | ICD-10-CM | POA: Insufficient documentation

## 2019-02-24 DIAGNOSIS — I2510 Atherosclerotic heart disease of native coronary artery without angina pectoris: Secondary | ICD-10-CM | POA: Insufficient documentation

## 2019-02-24 DIAGNOSIS — Z20828 Contact with and (suspected) exposure to other viral communicable diseases: Secondary | ICD-10-CM | POA: Insufficient documentation

## 2019-02-24 DIAGNOSIS — R112 Nausea with vomiting, unspecified: Secondary | ICD-10-CM | POA: Diagnosis not present

## 2019-02-24 DIAGNOSIS — E119 Type 2 diabetes mellitus without complications: Secondary | ICD-10-CM | POA: Insufficient documentation

## 2019-02-24 DIAGNOSIS — I4891 Unspecified atrial fibrillation: Secondary | ICD-10-CM | POA: Diagnosis not present

## 2019-02-24 DIAGNOSIS — Z7982 Long term (current) use of aspirin: Secondary | ICD-10-CM | POA: Diagnosis not present

## 2019-02-24 DIAGNOSIS — Z794 Long term (current) use of insulin: Secondary | ICD-10-CM | POA: Insufficient documentation

## 2019-02-24 DIAGNOSIS — R1110 Vomiting, unspecified: Secondary | ICD-10-CM | POA: Diagnosis not present

## 2019-02-24 DIAGNOSIS — I4510 Unspecified right bundle-branch block: Secondary | ICD-10-CM | POA: Diagnosis not present

## 2019-02-24 LAB — CBC
HCT: 44.3 % (ref 39.0–52.0)
Hemoglobin: 14.8 g/dL (ref 13.0–17.0)
MCH: 31.3 pg (ref 26.0–34.0)
MCHC: 33.4 g/dL (ref 30.0–36.0)
MCV: 93.7 fL (ref 80.0–100.0)
Platelets: 270 10*3/uL (ref 150–400)
RBC: 4.73 MIL/uL (ref 4.22–5.81)
RDW: 12.1 % (ref 11.5–15.5)
WBC: 13.8 10*3/uL — ABNORMAL HIGH (ref 4.0–10.5)
nRBC: 0 % (ref 0.0–0.2)

## 2019-02-24 LAB — URINALYSIS, ROUTINE W REFLEX MICROSCOPIC
Bacteria, UA: NONE SEEN
Bilirubin Urine: NEGATIVE
Glucose, UA: >=500 mg/dL — AB
Hgb urine dipstick: NEGATIVE
Ketones, ur: NEGATIVE mg/dL
Leukocytes,Ua: NEGATIVE
Nitrite: NEGATIVE
Protein, ur: NEGATIVE mg/dL
Specific Gravity, Urine: >1.046 — ABNORMAL HIGH (ref 1.005–1.030)
pH: 6 (ref 5.0–8.0)

## 2019-02-24 LAB — COMPREHENSIVE METABOLIC PANEL
ALT: 18 U/L (ref 0–44)
AST: 24 U/L (ref 15–41)
Albumin: 4.2 g/dL (ref 3.5–5.0)
Alkaline Phosphatase: 59 U/L (ref 38–126)
Anion gap: 9 (ref 5–15)
BUN: 20 mg/dL (ref 8–23)
CO2: 27 mmol/L (ref 22–32)
Calcium: 9.3 mg/dL (ref 8.9–10.3)
Chloride: 98 mmol/L (ref 98–111)
Creatinine, Ser: 1.03 mg/dL (ref 0.61–1.24)
GFR calc Af Amer: >60 mL/min (ref >60)
GFR calc non Af Amer: >60 mL/min (ref >60)
Glucose, Bld: 281 mg/dL — ABNORMAL HIGH (ref 70–99)
Potassium: 4.3 mmol/L (ref 3.5–5.1)
Sodium: 134 mmol/L — ABNORMAL LOW (ref 135–145)
Total Bilirubin: 0.7 mg/dL (ref 0.3–1.2)
Total Protein: 7.2 g/dL (ref 6.5–8.1)

## 2019-02-24 LAB — SARS CORONAVIRUS 2 BY RT PCR (HOSPITAL ORDER, PERFORMED IN ~~LOC~~ HOSPITAL LAB): SARS Coronavirus 2: NEGATIVE

## 2019-02-24 LAB — LIPASE, BLOOD: Lipase: 59 U/L — ABNORMAL HIGH (ref 11–51)

## 2019-02-24 MED ORDER — LACTATED RINGERS IV BOLUS
1000.0000 mL | Freq: Once | INTRAVENOUS | Status: AC
  Administered 2019-02-24: 1000 mL via INTRAVENOUS

## 2019-02-24 MED ORDER — IOHEXOL 300 MG/ML  SOLN
100.0000 mL | Freq: Once | INTRAMUSCULAR | Status: AC | PRN
  Administered 2019-02-24: 20:00:00 100 mL via INTRAVENOUS

## 2019-02-24 MED ORDER — SODIUM CHLORIDE 0.9% FLUSH: 3.0000 mL | Freq: Once | INTRAVENOUS | Status: DC

## 2019-02-24 MED ORDER — ONDANSETRON 4 MG PO TBDP
4.0000 mg | ORAL_TABLET | Freq: Once | ORAL | Status: AC
  Administered 2019-02-24: 4 mg via ORAL
  Filled 2019-02-24: qty 1

## 2019-02-24 MED ORDER — ONDANSETRON 8 MG PO TBDP: 8.0000 mg | ORAL_TABLET | Freq: Three times a day (TID) | ORAL | 0 refills | Status: DC | PRN

## 2019-02-24 NOTE — ED Notes (Signed)
Pt offered food. Pt declined at this time.

## 2019-02-24 NOTE — ED Provider Notes (Signed)
[facility] EMERGENCY DEPARTMENT Provider Note   CSN: [id number] Arrival date & time: 02/24/19  1337     History   Chief Complaint Chief Complaint  Patient presents with   Emesis    HPI Jacob Little is a 83 y.o. male.     HPI  83 year old male with history of CAD, dementia, stroke and diabetes comes in a chief complaint of vomiting. Patient resides at the nursing home.  He states that he has been feeling weak and nauseated for the last 2 days.  He has no chest pain, abdominal pain, loose bowel movements.  I spoke with the nursing home and they also informed that patient's appetite is gone down, and his activity level has declined over the past 2 or 3 days.  He has not had any coughs or fever.  He was given food today and he threw up.  They have not noted any fevers.  There is some GI bug going on at the nursing home.  There is no COVID-19 there.  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) 11/08/2017   Essential hypertension 11/08/2017   Diabetes mellitus with hyperglycemia, without long-term current use of insulin ([place])  11/08/2017   Acute ischemic left MCA stroke ([place]) 11/08/2017   DENTAL PAIN 05/12/2010   CORONARY ATHEROSCLEROSIS NATIVE CORONARY ARTERY 07/22/2009   DM 07/12/2009   HLD (hyperlipidemia) 07/12/2009   HYPERTENSION 07/12/2009   ANGINA, ATYPICAL 07/12/2009    Past Surgical History:  Procedure Laterality Date   APPENDECTOMY     CORONARY ANGIOPLASTY WITH STENT PLACEMENT          Home Medications    Prior to Admission medications   Medication Sig Start Date End Date Taking? Authorizing Provider  acetaminophen (TYLENOL) 325 MG tablet Take 2 tablets (650 mg total) by mouth every 4 (four) hours as needed for mild pain (or temp > 37.5 C (99.5 F)). 11/12/17  Yes Barton Dubois, MD  albuterol (PROVENTIL HFA;VENTOLIN HFA) 108 (90 Base) MCG/ACT inhaler Inhale 2 puffs into the lungs every 6 (six) hours as needed for wheezing or shortness of breath. 03/16/18  Yes Kathie Dike, MD  aspirin EC 325 MG tablet Take 325 mg by mouth daily.   Yes [provider]  atorvastatin (LIPITOR) 20 MG tablet Take 20 mg by mouth daily.   Yes [provider]  cyanocobalamin (,VITAMIN B-12,) 1000 MCG/ML injection Inject 1,000 mcg into the muscle every 30 (thirty) days.  09/14/17  Yes [provider]  glimepiride (AMARYL) 1 MG tablet Take 1 tablet (1 mg total) by mouth every morning.  01/07/18 02/24/19 Yes Barton Dubois, MD  insulin glargine (LANTUS) 100 UNIT/ML injection Inject 8 Units into the skin daily.   Yes [provider]  isosorbide mononitrate (IMDUR) 30 MG 24 hr tablet Take 0.5 tablets (15 mg total) by mouth 2 (two) times daily. 01/07/18 02/24/19 Yes Barton Dubois, MD  JANUVIA 100 MG tablet Take 1 tablet by mouth daily. 11/04/18  Yes [provider]  metoprolol tartrate (LOPRESSOR) 25 MG tablet Take 0.5 tablets (12.5 mg total) by mouth 2 (two) times daily. 11/12/17  Yes Barton Dubois, MD  nitroGLYCERIN (NITROSTAT) 0.4 MG SL tablet Place 0.4 mg under the tongue  every 5 (five) minutes as needed for chest pain.   Yes [provider]  risperiDONE (RISPERDAL) 0.25 MG tablet Take 1 tablet by mouth daily. 11/04/18  Yes [provider]  divalproex (DEPAKOTE) 250 MG DR tablet Take 1 tablet (250 mg total) by mouth every 8 (eight) hours. Patient not taking: Reported on 02/24/2019 11/12/17   Barton Dubois, MD  guaiFENesin (MUCINEX) 600 MG 12 hr tablet Take 1 tablet (600 mg total) by mouth 2 (two) times daily. Patient not taking: Reported on 02/24/2019 03/16/18   Kathie Dike, MD  ondansetron (ZOFRAN ODT) 8 MG disintegrating tablet Take 1 tablet (8 mg total) by mouth every 8 (eight) hours as needed for nausea. 02/24/19   Varney Biles, MD  OZEMPIC 0.25 or 0.5 MG/DOSE SOPN Inject 0.25 mg into the skin once a week. Patient not taking: Reported on 03/14/2018 11/12/17   Barton Dubois, MD  pantoprazole (PROTONIX) 40 MG tablet Take 1 tablet (40 mg total) by mouth daily. Patient not taking: Reported on 02/24/2019 01/08/18   Barton Dubois, MD  saccharomyces boulardii (FLORASTOR) 250 MG capsule Take 1 capsule (250 mg total) by mouth 2 (two) times daily. Patient not taking: Reported on 02/24/2019 01/07/18   Barton Dubois, MD    Family History No family history on file.  Social History Social History   Tobacco Use   Smoking status: Former Smoker    Types: Cigarettes    Quit date: 06/15/2005    Years since quitting: 13.7   Smokeless tobacco: Never Used  Substance Use Topics   Alcohol use: No    Alcohol/week: 0.0 standard drinks   Drug use: No     Allergies   Patient has no known allergies.   Review of Systems Review of Systems  Constitutional: Positive for activity change.  Respiratory: Negative for cough and shortness of breath.   Cardiovascular: Negative for chest pain.  Gastrointestinal: Positive for nausea and vomiting. Negative for abdominal pain.  Genitourinary: Negative for dysuria.  All other systems reviewed and are  negative.    Physical Exam Updated Vital Signs BP (!) 174/75    Pulse (!) 56    Temp 97.8 F (36.6 C) (Oral)    Resp 14    Ht 5\' 9"  (1.753 m)    Wt 77.1 kg    SpO2 97%    BMI 25.10 kg/m   Physical Exam Vitals signs and nursing note reviewed.  Constitutional:      Appearance: He is well-developed.  HENT:     Head: Atraumatic.  Neck:     Musculoskeletal: Neck supple.  Cardiovascular:     Rate and Rhythm: Normal rate.  Pulmonary:     Effort: Pulmonary effort is normal.  Abdominal:     Tenderness: There is no abdominal tenderness.  Musculoskeletal:     Right lower leg: No edema.  Left lower leg: No edema.  Skin:    General: Skin is warm.  Neurological:     Mental Status: He is alert and oriented to person, place, and time.      ED Treatments / Results  Labs (all labs ordered are listed, but only abnormal results are displayed) Labs Reviewed  LIPASE, BLOOD - Abnormal; Notable for the following components:      Result Value   Lipase 59 (*)    All other components within normal limits  COMPREHENSIVE METABOLIC PANEL - Abnormal; Notable for the following components:   Sodium 134 (*)    Glucose, Bld 281 (*)    All other components within normal limits  CBC - Abnormal; Notable for the following components:   WBC 13.8 (*)    All other components within normal limits  URINALYSIS, ROUTINE W REFLEX MICROSCOPIC - Abnormal; Notable for the following components:   Specific Gravity, Urine >1.046 (*)    Glucose, UA >=500 (*)    All other components within normal limits  SARS CORONAVIRUS 2 (HOSPITAL ORDER, [name])    EKG EKG Interpretation  Date/Time:  Friday February 24 2019 20:32:52 EDT Ventricular Rate:  58 PR Interval:    QRS Duration: 137 QT Interval:  488 QTC Calculation: 480 R Axis:   -82 Text Interpretation:  Atrial fibrillation Right bundle branch block Inferior infarct, old No acute changes No significant change since last  tracing Confirmed by Varney Biles [phone]) on 02/24/2019 9:43:32 PM   Radiology Ct Abdomen Pelvis W Contrast  Result Date: 02/24/2019 CLINICAL DATA:  Abdominal distension. Nausea and vomiting. Loss of appetite. Fatigue. EXAM: CT ABDOMEN AND PELVIS WITH CONTRAST TECHNIQUE: Multidetector CT imaging of the abdomen and pelvis was performed using the standard protocol following bolus administration of intravenous contrast. CONTRAST:  162mL OMNIPAQUE IOHEXOL 300 MG/ML  SOLN COMPARISON:  None. FINDINGS: Lower chest: Breathing motion artifact through the lower chest. No obvious confluent airspace disease. There are coronary artery calcifications. Hepatobiliary: No focal liver abnormality is seen. No gallstones, gallbladder wall thickening, or biliary dilatation. Pancreas: No ductal dilatation or inflammation. No evidence of pancreatic mass. Spleen: Normal in size without focal abnormality. Adrenals/Urinary Tract: Normal adrenal glands. No hydronephrosis or perinephric edema. Homogeneous renal enhancement with symmetric excretion on delayed phase imaging. Urinary bladder is physiologically distended without wall thickening. Stomach/Bowel: Tiny hiatal hernia. Mild motion artifact through the stomach which partially limits assessment. No obvious gastric wall thickening. No small bowel dilatation or obstruction. No evidence of small bowel inflammation. Appendix is surgically absent per history. Moderate volume of stool in the proximal colon. Colonic diverticulosis, significant in the sigmoid colon. No evidence of diverticulitis or acute colonic inflammation. Vascular/Lymphatic: Aorto bi-iliac atherosclerosis. No aneurysm. The IVC may be partially duplicated. No enlarged lymph nodes in the abdomen or pelvis. Portal vein is grossly patent, not well assessed due to phase of contrast and venous mixing. Reproductive: Prominent prostate gland spanning 4.6 cm. Other: Tiny fat containing umbilical hernia. Minimal fat in the left  inguinal canal. No free air, free fluid, or intra-abdominal fluid collection. Musculoskeletal: There are no acute or suspicious osseous abnormalities. IMPRESSION: 1. No acute abnormality in the abdomen/pelvis. 2. Colonic diverticulosis without diverticulitis. 3. Aortic Atherosclerosis (ICD10-I70.0). Coronary artery calcifications. Electronically Signed   By: Keith Rake M.D.   On: 02/24/2019 20:05    Procedures Procedures (including critical care time)  Medications Ordered in ED Medications  sodium chloride flush (NS) 0.9 % injection 3 mL (  has no administration in time range)  iohexol (OMNIPAQUE) 300 MG/ML solution 100 mL (100 mLs Intravenous Contrast Given 02/24/19 1934)  ondansetron (ZOFRAN-ODT) disintegrating tablet 4 mg (4 mg Oral Given 02/24/19 2046)  lactated ringers bolus 1,000 mL (0 mLs Intravenous Stopped 02/24/19 2147)     Initial Impression / Assessment and Plan / ED Course  I have reviewed the triage vital signs and the nursing notes.  Pertinent labs & imaging results that were available during my care of the patient were reviewed by me and considered in my medical decision making (see chart for details).  Clinical Course as of Feb 23 2346  Fri Feb 24, 2019  2049 Discussed case with patient's family.  We will get an EKG for screening purposes.  Patient has no chest pain, shortness of breath.  We will also get cover test given his living situation is in a high risk environment. P.o. challenge and hydration initiated.  Still need a UA.   [AN]    Clinical Course User Index [AN] Varney Biles, MD       Patient comes in a chief complaint of nausea and vomiting.  He has been feeling sick for the last 2 or 3 days.  No abdominal pain, no chest pain.  EKG is not showing any signs of ischemia.  He has history of CAD, but this does not appear to be ACS.  There is no cough, abdominal exam, cardiopulmonary exam are reassuring.  Lab work-up in the ER shows slightly elevated lipase,  with otherwise reassuring labs.  No signs of UTI but there is elevated specific gravity.  We added a COVID-19 test to ensure there was no GI manifestation of it.  COVID-19 is negative.  Results of the ER work-up discussed with the patient and his family.  They wanted to ensure that he did not have UTI, and the UA is also normal.  Pt reassessed prior to d/c and po challenge. Pt's VSS and WNL. Pt's cap refill < 3 seconds. Pt has been hydrated in the ER and now passed po challenge. We will discharge with antiemetic. Strict ER return precautions have been discussed and pt will return if he is unable to tolerate fluids and symptoms are getting worse.   The patient appears reasonably screened and/or stabilized for discharge and I doubt any other medical condition or other [facility] requiring further screening, evaluation, or treatment in the ED at this time prior to discharge.   Results from the ER workup discussed with the patient face to face and all questions answered to the best of my ability. The patient is safe for discharge with strict return precautions.   BUCKY LENKIEWICZ was evaluated in Emergency Department on 02/24/2019 for the symptoms described in the history of present illness. He was evaluated in the context of the global COVID-19 pandemic, which necessitated consideration that the patient might be at risk for infection with the SARS-CoV-2 virus that causes COVID-19. Institutional protocols and algorithms that pertain to the evaluation of patients at risk for COVID-19 are in a state of rapid change based on information released by regulatory bodies including the CDC and federal and state organizations. These policies and algorithms were followed during the patient's care in the ED.   Final Clinical Impressions(s) / ED Diagnoses   Final diagnoses:  Non-intractable vomiting with nausea, unspecified vomiting type    ED Discharge Orders         Ordered    ondansetron (ZOFRAN ODT) 8 MG  disintegrating tablet  Every 8 hours PRN     02/24/19 2141           Varney Biles, MD 02/24/19 2347

## 2019-02-24 NOTE — ED Notes (Signed)
Patient transported to CT 

## 2019-02-24 NOTE — Discharge Instructions (Addendum)
We saw Jacob Little in the ER for nausea, vomiting. He had a CT scan of his abdomen which did not show any concerning findings. All of his blood work is overall reassuring. COVID-19 test is also negative.  He is passed oral challenge in the ER.  We are discharging him with nausea medication.  We recommend clear liquid diet for the next 2 days that can be advanced to normal if he tolerates it.  Ensure that he is properly hydrated over the next couple of days.  Send him back to the ER if he is getting worse.

## 2019-02-24 NOTE — ED Notes (Signed)
Pt unable to obtain urine at this time. 

## 2019-02-24 NOTE — ED Triage Notes (Addendum)
N/v/d, decreased appetite for past few days.  Pt from [facility]

## 2019-02-27 DIAGNOSIS — M6281 Muscle weakness (generalized): Secondary | ICD-10-CM | POA: Diagnosis not present

## 2019-02-27 DIAGNOSIS — R296 Repeated falls: Secondary | ICD-10-CM | POA: Diagnosis not present

## 2019-02-27 DIAGNOSIS — R2689 Other abnormalities of gait and mobility: Secondary | ICD-10-CM | POA: Diagnosis not present

## 2019-03-01 DIAGNOSIS — R296 Repeated falls: Secondary | ICD-10-CM | POA: Diagnosis not present

## 2019-03-01 DIAGNOSIS — R2689 Other abnormalities of gait and mobility: Secondary | ICD-10-CM | POA: Diagnosis not present

## 2019-03-01 DIAGNOSIS — M6281 Muscle weakness (generalized): Secondary | ICD-10-CM | POA: Diagnosis not present

## 2019-03-03 DIAGNOSIS — K529 Noninfective gastroenteritis and colitis, unspecified: Secondary | ICD-10-CM | POA: Diagnosis not present

## 2019-03-03 DIAGNOSIS — I700 Atherosclerosis of aorta: Secondary | ICD-10-CM | POA: Diagnosis not present

## 2019-03-03 DIAGNOSIS — E1129 Type 2 diabetes mellitus with other diabetic kidney complication: Secondary | ICD-10-CM | POA: Diagnosis not present

## 2019-03-06 DIAGNOSIS — R2689 Other abnormalities of gait and mobility: Secondary | ICD-10-CM | POA: Diagnosis not present

## 2019-03-06 DIAGNOSIS — R296 Repeated falls: Secondary | ICD-10-CM | POA: Diagnosis not present

## 2019-03-06 DIAGNOSIS — M6281 Muscle weakness (generalized): Secondary | ICD-10-CM | POA: Diagnosis not present

## 2019-03-08 DIAGNOSIS — R296 Repeated falls: Secondary | ICD-10-CM | POA: Diagnosis not present

## 2019-03-08 DIAGNOSIS — M6281 Muscle weakness (generalized): Secondary | ICD-10-CM | POA: Diagnosis not present

## 2019-03-08 DIAGNOSIS — R2689 Other abnormalities of gait and mobility: Secondary | ICD-10-CM | POA: Diagnosis not present

## 2019-03-13 DIAGNOSIS — R296 Repeated falls: Secondary | ICD-10-CM | POA: Diagnosis not present

## 2019-03-13 DIAGNOSIS — R2689 Other abnormalities of gait and mobility: Secondary | ICD-10-CM | POA: Diagnosis not present

## 2019-03-13 DIAGNOSIS — M6281 Muscle weakness (generalized): Secondary | ICD-10-CM | POA: Diagnosis not present

## 2019-03-15 DIAGNOSIS — M6281 Muscle weakness (generalized): Secondary | ICD-10-CM | POA: Diagnosis not present

## 2019-03-15 DIAGNOSIS — R296 Repeated falls: Secondary | ICD-10-CM | POA: Diagnosis not present

## 2019-03-15 DIAGNOSIS — R2689 Other abnormalities of gait and mobility: Secondary | ICD-10-CM | POA: Diagnosis not present

## 2019-03-16 DIAGNOSIS — Z23 Encounter for immunization: Secondary | ICD-10-CM | POA: Diagnosis not present

## 2019-03-21 DIAGNOSIS — M6281 Muscle weakness (generalized): Secondary | ICD-10-CM | POA: Diagnosis not present

## 2019-03-21 DIAGNOSIS — R2689 Other abnormalities of gait and mobility: Secondary | ICD-10-CM | POA: Diagnosis not present

## 2019-03-21 DIAGNOSIS — R296 Repeated falls: Secondary | ICD-10-CM | POA: Diagnosis not present

## 2019-03-23 DIAGNOSIS — M6281 Muscle weakness (generalized): Secondary | ICD-10-CM | POA: Diagnosis not present

## 2019-03-23 DIAGNOSIS — R2689 Other abnormalities of gait and mobility: Secondary | ICD-10-CM | POA: Diagnosis not present

## 2019-03-23 DIAGNOSIS — R296 Repeated falls: Secondary | ICD-10-CM | POA: Diagnosis not present

## 2019-03-28 DIAGNOSIS — R278 Other lack of coordination: Secondary | ICD-10-CM | POA: Diagnosis not present

## 2019-03-28 DIAGNOSIS — M6281 Muscle weakness (generalized): Secondary | ICD-10-CM | POA: Diagnosis not present

## 2019-03-29 DIAGNOSIS — R278 Other lack of coordination: Secondary | ICD-10-CM | POA: Diagnosis not present

## 2019-03-29 DIAGNOSIS — M6281 Muscle weakness (generalized): Secondary | ICD-10-CM | POA: Diagnosis not present

## 2019-04-03 DIAGNOSIS — R278 Other lack of coordination: Secondary | ICD-10-CM | POA: Diagnosis not present

## 2019-04-03 DIAGNOSIS — M6281 Muscle weakness (generalized): Secondary | ICD-10-CM | POA: Diagnosis not present

## 2019-04-05 DIAGNOSIS — R278 Other lack of coordination: Secondary | ICD-10-CM | POA: Diagnosis not present

## 2019-04-05 DIAGNOSIS — M6281 Muscle weakness (generalized): Secondary | ICD-10-CM | POA: Diagnosis not present

## 2019-04-10 DIAGNOSIS — M6281 Muscle weakness (generalized): Secondary | ICD-10-CM | POA: Diagnosis not present

## 2019-04-10 DIAGNOSIS — R278 Other lack of coordination: Secondary | ICD-10-CM | POA: Diagnosis not present

## 2019-04-12 DIAGNOSIS — M6281 Muscle weakness (generalized): Secondary | ICD-10-CM | POA: Diagnosis not present

## 2019-04-12 DIAGNOSIS — R278 Other lack of coordination: Secondary | ICD-10-CM | POA: Diagnosis not present

## 2019-04-17 DIAGNOSIS — M6281 Muscle weakness (generalized): Secondary | ICD-10-CM | POA: Diagnosis not present

## 2019-04-17 DIAGNOSIS — R278 Other lack of coordination: Secondary | ICD-10-CM | POA: Diagnosis not present

## 2019-04-19 DIAGNOSIS — M6281 Muscle weakness (generalized): Secondary | ICD-10-CM | POA: Diagnosis not present

## 2019-04-19 DIAGNOSIS — R278 Other lack of coordination: Secondary | ICD-10-CM | POA: Diagnosis not present

## 2019-04-25 DIAGNOSIS — F0150 Vascular dementia without behavioral disturbance: Secondary | ICD-10-CM | POA: Diagnosis not present

## 2019-04-25 DIAGNOSIS — D510 Vitamin B12 deficiency anemia due to intrinsic factor deficiency: Secondary | ICD-10-CM | POA: Diagnosis not present

## 2019-04-25 DIAGNOSIS — R278 Other lack of coordination: Secondary | ICD-10-CM | POA: Diagnosis not present

## 2019-04-25 DIAGNOSIS — M6281 Muscle weakness (generalized): Secondary | ICD-10-CM | POA: Diagnosis not present

## 2019-04-25 DIAGNOSIS — E1129 Type 2 diabetes mellitus with other diabetic kidney complication: Secondary | ICD-10-CM | POA: Diagnosis not present

## 2019-04-25 DIAGNOSIS — Z79899 Other long term (current) drug therapy: Secondary | ICD-10-CM | POA: Diagnosis not present

## 2019-04-27 DIAGNOSIS — M6281 Muscle weakness (generalized): Secondary | ICD-10-CM | POA: Diagnosis not present

## 2019-04-27 DIAGNOSIS — R278 Other lack of coordination: Secondary | ICD-10-CM | POA: Diagnosis not present

## 2019-05-02 DIAGNOSIS — E1129 Type 2 diabetes mellitus with other diabetic kidney complication: Secondary | ICD-10-CM | POA: Diagnosis not present

## 2019-05-02 DIAGNOSIS — I2510 Atherosclerotic heart disease of native coronary artery without angina pectoris: Secondary | ICD-10-CM | POA: Diagnosis not present

## 2019-05-02 DIAGNOSIS — D649 Anemia, unspecified: Secondary | ICD-10-CM | POA: Diagnosis not present

## 2019-05-07 DIAGNOSIS — M6281 Muscle weakness (generalized): Secondary | ICD-10-CM | POA: Diagnosis not present

## 2019-05-07 DIAGNOSIS — R278 Other lack of coordination: Secondary | ICD-10-CM | POA: Diagnosis not present

## 2019-05-18 DIAGNOSIS — Z20828 Contact with and (suspected) exposure to other viral communicable diseases: Secondary | ICD-10-CM | POA: Diagnosis not present

## 2019-05-18 DIAGNOSIS — U071 COVID-19: Secondary | ICD-10-CM | POA: Diagnosis not present

## 2019-05-22 DIAGNOSIS — U071 COVID-19: Secondary | ICD-10-CM | POA: Diagnosis not present

## 2019-05-22 DIAGNOSIS — Z20828 Contact with and (suspected) exposure to other viral communicable diseases: Secondary | ICD-10-CM | POA: Diagnosis not present

## 2019-05-29 DIAGNOSIS — Z20828 Contact with and (suspected) exposure to other viral communicable diseases: Secondary | ICD-10-CM | POA: Diagnosis not present

## 2019-05-29 DIAGNOSIS — U071 COVID-19: Secondary | ICD-10-CM | POA: Diagnosis not present

## 2019-05-31 DIAGNOSIS — R278 Other lack of coordination: Secondary | ICD-10-CM | POA: Diagnosis not present

## 2019-06-01 DIAGNOSIS — R278 Other lack of coordination: Secondary | ICD-10-CM | POA: Diagnosis not present

## 2019-06-02 DIAGNOSIS — R278 Other lack of coordination: Secondary | ICD-10-CM | POA: Diagnosis not present

## 2019-06-04 DIAGNOSIS — R278 Other lack of coordination: Secondary | ICD-10-CM | POA: Diagnosis not present

## 2019-06-06 DIAGNOSIS — R278 Other lack of coordination: Secondary | ICD-10-CM | POA: Diagnosis not present

## 2019-06-11 DIAGNOSIS — R278 Other lack of coordination: Secondary | ICD-10-CM | POA: Diagnosis not present

## 2019-06-13 DIAGNOSIS — R278 Other lack of coordination: Secondary | ICD-10-CM | POA: Diagnosis not present

## 2019-06-20 DIAGNOSIS — R278 Other lack of coordination: Secondary | ICD-10-CM | POA: Diagnosis not present

## 2019-06-21 DIAGNOSIS — R278 Other lack of coordination: Secondary | ICD-10-CM | POA: Diagnosis not present

## 2019-06-22 DIAGNOSIS — R278 Other lack of coordination: Secondary | ICD-10-CM | POA: Diagnosis not present

## 2019-06-26 ENCOUNTER — Telehealth: Payer: Self-pay | Admitting: Cardiology

## 2019-06-26 DIAGNOSIS — R278 Other lack of coordination: Secondary | ICD-10-CM | POA: Diagnosis not present

## 2019-06-26 DIAGNOSIS — M6281 Muscle weakness (generalized): Secondary | ICD-10-CM | POA: Diagnosis not present

## 2019-06-26 DIAGNOSIS — U071 COVID-19: Secondary | ICD-10-CM | POA: Diagnosis not present

## 2019-06-26 NOTE — Telephone Encounter (Signed)

## 2019-06-28 DIAGNOSIS — U071 COVID-19: Secondary | ICD-10-CM | POA: Diagnosis not present

## 2019-06-28 DIAGNOSIS — M6281 Muscle weakness (generalized): Secondary | ICD-10-CM | POA: Diagnosis not present

## 2019-06-28 DIAGNOSIS — R278 Other lack of coordination: Secondary | ICD-10-CM | POA: Diagnosis not present

## 2019-06-29 DIAGNOSIS — R278 Other lack of coordination: Secondary | ICD-10-CM | POA: Diagnosis not present

## 2019-06-29 DIAGNOSIS — M6281 Muscle weakness (generalized): Secondary | ICD-10-CM | POA: Diagnosis not present

## 2019-06-29 DIAGNOSIS — U071 COVID-19: Secondary | ICD-10-CM | POA: Diagnosis not present

## 2019-06-29 NOTE — Progress Notes (Signed)
Virtual Visit via Telephone Note   This visit type was conducted due to national recommendations for restrictions regarding the COVID-19 Pandemic (e.g. social distancing) in an effort to limit this patient's exposure and mitigate transmission in our community.  Due to his co-morbid illnesses, this patient is at least at moderate risk for complications without adequate follow up.  This format is felt to be most appropriate for this patient at this time.  The patient did not have access to video technology/had technical difficulties with video requiring transitioning to audio format only (telephone).  All issues noted in this document were discussed and addressed.  No physical exam could be performed with this format.  Please refer to the patient's chart for his  consent to telehealth for [facility].   Date:  06/30/2019   ID:  Jacob Little, DOB 1936-04-05, MRN [id number]  Patient Location: [facility] Provider Location: Home  PCP:  [name], MD  Cardiologist: Satira Sark, MD Electrophysiologist:  None   Evaluation Performed:  Follow-Up Visit  Chief Complaint:  Cardiac follow-up  History of Present Illness:    Jacob Little is an 84 y.o. male last seen in February 2019.  We spoke by phone today, communicated largely with Jacob Little was with the patient on speaker phone.  Mr. Ullom did not indicate any concerns regarding chest pain, breathlessness, or palpitations.  He did have recent COVID-19 with outbreak in the facility.  Fortunately, no major symptomatology.  I reviewed his current medications which are listed below.  Aspirin will be cut back to 81 mg daily.  He also had lab work done per Dr. Willey Blade in November 2020, we are requesting the results for review.  Echocardiogram from May 2019 revealed LVEF 55 to 60% with mild diastolic dysfunction, mild aortic regurgitation.   Past Medical History:  Diagnosis Date  . Coronary artery disease    DES to circumflex  2006  . Dementia ([place])   . Encephalopathy   . Essential hypertension   . Hyperlipidemia   . Rheumatic fever   . Stroke ([place])   . Type 2 diabetes mellitus ([place])    Past Surgical History:  Procedure Laterality Date  . APPENDECTOMY    . CORONARY ANGIOPLASTY WITH STENT PLACEMENT       Current Meds  Medication Sig  . acetaminophen (TYLENOL) 325 MG tablet Take 2 tablets (650 mg total) by mouth every 4 (four) hours as needed for mild pain (or temp > 37.5 C (99.5 F)).  Marland Kitchen albuterol (PROVENTIL HFA;VENTOLIN HFA) 108 (90 Base) MCG/ACT inhaler Inhale 2 puffs into the lungs every 6 (six) hours as needed for wheezing or shortness of breath.  Marland Kitchen atorvastatin (LIPITOR) 20 MG tablet Take 20 mg by mouth daily.  . cyanocobalamin (,VITAMIN B-12,) 1000 MCG/ML injection Inject 1,000 mcg into the muscle every 30 (thirty) days.   Marland Kitchen glimepiride (AMARYL) 1 MG tablet Take 1 tablet (1 mg total) by mouth every morning.  . insulin glargine (LANTUS) 100 UNIT/ML injection Inject 16 Units into the skin daily.   . isosorbide mononitrate (IMDUR) 30 MG 24 hr tablet Take 0.5 tablets (15 mg total) by mouth 2 (two) times daily.  Marland Kitchen JANUVIA 100 MG tablet Take 1 tablet by mouth daily.  . metoprolol tartrate (LOPRESSOR) 25 MG tablet Take 0.5 tablets (12.5 mg total) by mouth 2 (two) times daily.  . nitroGLYCERIN (NITROSTAT) 0.4 MG SL tablet Place 0.4 mg under the tongue every 5 (five) minutes as needed for  chest pain.  Marland Kitchen ondansetron (ZOFRAN ODT) 8 MG disintegrating tablet Take 1 tablet (8 mg total) by mouth every 8 (eight) hours as needed for nausea.  . risperiDONE (RISPERDAL) 0.25 MG tablet Take 1 tablet by mouth daily.  . [DISCONTINUED] aspirin EC 325 MG tablet Take 325 mg by mouth daily.     Allergies:   Patient has no known allergies.   Social History   Tobacco Use  . Smoking status: Former Smoker    Types: Cigarettes    Quit date: 06/15/2005    Years since quitting: 14.0  . Smokeless tobacco: Never Used  Substance  Use Topics  . Alcohol use: No    Alcohol/week: 0.0 standard drinks  . Drug use: No     Family Hx: The patient's family history is not on file.  ROS:   Please see the history of present illness. All other systems reviewed and are negative.   Prior CV studies:   The following studies were reviewed today:  Echocardiogram 11/09/2017: Study Conclusions  - Left ventricle: The cavity size was normal. Wall thickness was   increased in a pattern of mild LVH. There was moderate focal   basal hypertrophy of the septum. Systolic function was normal.   The estimated ejection fraction was in the range of 55% to 60%.   Wall motion was normal; there were no regional wall motion   abnormalities. Doppler parameters are consistent with abnormal   left ventricular relaxation (grade 1 diastolic dysfunction). - Aortic valve: Trileaflet; mildly calcified leaflets. Moderate   calcification involving the noncoronary cusp. There was mild   regurgitation. - Mitral valve: Mildly calcified annulus. There was trivial   regurgitation. - Right atrium: The atrium was moderately dilated. Central venous   pressure (est): 3 mm Hg. - Atrial septum: No defect or patent foramen ovale was identified. - Tricuspid valve: There was trivial regurgitation. - Pulmonary arteries: Systolic pressure could not be accurately   estimated. - Pericardium, extracardiac: There was no pericardial effusion.  Carotid Dopplers 11/09/2017: IMPRESSION: 1. Bilateral carotid bifurcation and proximal ICA plaque resulting in less than 50% diameter stenosis. 2.  Antegrade bilateral vertebral arterial flow.  Labs/Other Tests and Data Reviewed:    EKG:  An ECG dated 02/24/2019 was personally reviewed today and demonstrated:  Atrial fibrillation with right bundle branch block and left anterior fascicular block, rule out old inferior infarct pattern.  Recent Labs: 02/24/2019: ALT 18; BUN 20; Creatinine, Ser 1.03; Hemoglobin 14.8;  Platelets 270; Potassium 4.3; Sodium 134   Recent Lipid Panel Lab Results  Component Value Date/Time   CHOL 96 11/09/2017 04:47 AM   TRIG 122 11/09/2017 04:47 AM   HDL 36 (L) 11/09/2017 04:47 AM   CHOLHDL 2.7 11/09/2017 04:47 AM   LDLCALC 36 11/09/2017 04:47 AM  November 2019: BUN 15, creatinine 0.93, potassium 5.1, AST 10, ALT 14, hemoglobin 12.8, platelets 239, cholesterol 102, triglycerides 63, HDL 46, LDL 43, hemoglobin A1c 6.8%  Wt Readings from Last 3 Encounters:  06/30/19 148 lb (67.1 kg)  02/24/19 170 lb (77.1 kg)  08/27/18 160 lb (72.6 kg)     Objective:    Vital Signs:  BP (!) 167/69   Pulse 62   Ht 5\' 10"  (1.778 m)   Wt 148 lb (67.1 kg)   SpO2 97%   BMI 21.24 kg/m    Patient spoke only a few words, did not sound short of breath.  ASSESSMENT & PLAN:    1.  CAD status post DES  to the circumflex in 2006 with moderate residual disease that has been managed medically.  He does not report any obvious angina symptoms, fairly sedentary in nursing facility.  Plan will be to continue medical therapy and observation.  Reduce aspirin to 81 mg daily.  Continue Lopressor, Imdur, and Lipitor.  2.  Mixed hyperlipidemia, continues on statin therapy.  Requesting interval lab work from Dr. Willey Blade.  COVID-19 Education: The signs and symptoms of COVID-19 were discussed with the patient and how to seek care for testing (follow up with PCP or arrange E-visit).  The importance of social distancing was discussed today.  Time:   Today, I have spent 9 minutes with the patient with telehealth technology discussing the above problems.     Medication Adjustments/Labs and Tests Ordered: Current medicines are reviewed at length with the patient today.  Concerns regarding medicines are outlined above.   Tests Ordered: No orders of the defined types were placed in this encounter.   Medication Changes: Meds ordered this encounter  Medications  . aspirin EC 81 MG tablet    Sig: Take 1  tablet (81 mg total) by mouth daily.    Dispense:  90 tablet    Refill:  3    06/30/2019 dose decreased from 325 mg    Follow Up:  Either In Person or Virtual 1 year.  Signed, Rozann Lesches, MD  06/30/2019 10:04 AM    [place]

## 2019-06-30 ENCOUNTER — Telehealth (INDEPENDENT_AMBULATORY_CARE_PROVIDER_SITE_OTHER): Payer: PPO | Admitting: Cardiology

## 2019-06-30 ENCOUNTER — Encounter: Payer: Self-pay | Admitting: Cardiology

## 2019-06-30 VITALS — BP 167/69 | HR 62 | Ht 70.0 in | Wt 148.0 lb

## 2019-06-30 DIAGNOSIS — I25119 Atherosclerotic heart disease of native coronary artery with unspecified angina pectoris: Secondary | ICD-10-CM

## 2019-06-30 DIAGNOSIS — E1165 Type 2 diabetes mellitus with hyperglycemia: Secondary | ICD-10-CM

## 2019-06-30 DIAGNOSIS — E782 Mixed hyperlipidemia: Secondary | ICD-10-CM | POA: Diagnosis not present

## 2019-06-30 DIAGNOSIS — I10 Essential (primary) hypertension: Secondary | ICD-10-CM

## 2019-06-30 MED ORDER — ASPIRIN EC 81 MG PO TBEC: 81.0000 mg | DELAYED_RELEASE_TABLET | Freq: Every day | ORAL | 3 refills | Status: DC

## 2019-06-30 NOTE — Patient Instructions (Signed)
Medication Instructions:  DECREASE Aspirin to 81 mg daily *If you need a refill on your cardiac medications before your next appointment, please call your pharmacy*  Lab Work: NONE  If you have labs (blood work) drawn today and your tests are completely normal, you will receive your results only by: Marland Kitchen MyChart Message (if you have MyChart) OR . A paper copy in the mail If you have any lab test that is abnormal or we need to change your treatment, we will call you to review the results.  Testing/Procedures: NONE   Follow-Up: At [facility], you and your health needs are our priority.  As part of our continuing mission to provide you with exceptional heart care, we have created designated Provider Care Teams.  These Care Teams include your primary Cardiologist (physician) and Advanced Practice Providers (APPs -  Physician Assistants and Nurse Practitioners) who all work together to provide you with the care you need, when you need it.  Your next appointment:   12 month(s)  The format for your next appointment:   In Person  Provider:   Rozann Lesches, MD  Other Instructions NONE     Thank you for choosing [facility] !

## 2019-07-02 DIAGNOSIS — R278 Other lack of coordination: Secondary | ICD-10-CM | POA: Diagnosis not present

## 2019-07-02 DIAGNOSIS — U071 COVID-19: Secondary | ICD-10-CM | POA: Diagnosis not present

## 2019-07-02 DIAGNOSIS — M6281 Muscle weakness (generalized): Secondary | ICD-10-CM | POA: Diagnosis not present

## 2019-07-26 DIAGNOSIS — D510 Vitamin B12 deficiency anemia due to intrinsic factor deficiency: Secondary | ICD-10-CM | POA: Diagnosis not present

## 2019-07-26 DIAGNOSIS — I2510 Atherosclerotic heart disease of native coronary artery without angina pectoris: Secondary | ICD-10-CM | POA: Diagnosis not present

## 2019-07-26 DIAGNOSIS — Z79899 Other long term (current) drug therapy: Secondary | ICD-10-CM | POA: Diagnosis not present

## 2019-07-26 DIAGNOSIS — E1129 Type 2 diabetes mellitus with other diabetic kidney complication: Secondary | ICD-10-CM | POA: Diagnosis not present

## 2019-08-02 DIAGNOSIS — F0150 Vascular dementia without behavioral disturbance: Secondary | ICD-10-CM | POA: Diagnosis not present

## 2019-08-02 DIAGNOSIS — E1129 Type 2 diabetes mellitus with other diabetic kidney complication: Secondary | ICD-10-CM | POA: Diagnosis not present

## 2019-08-02 DIAGNOSIS — D649 Anemia, unspecified: Secondary | ICD-10-CM | POA: Diagnosis not present

## 2019-09-12 DIAGNOSIS — Z20828 Contact with and (suspected) exposure to other viral communicable diseases: Secondary | ICD-10-CM | POA: Diagnosis not present

## 2019-09-12 DIAGNOSIS — U071 COVID-19: Secondary | ICD-10-CM | POA: Diagnosis not present

## 2019-09-25 ENCOUNTER — Other Ambulatory Visit: Payer: Self-pay | Admitting: Cardiology

## 2019-09-25 DIAGNOSIS — Z20828 Contact with and (suspected) exposure to other viral communicable diseases: Secondary | ICD-10-CM | POA: Diagnosis not present

## 2019-09-25 DIAGNOSIS — U071 COVID-19: Secondary | ICD-10-CM | POA: Diagnosis not present

## 2019-10-02 DIAGNOSIS — R2689 Other abnormalities of gait and mobility: Secondary | ICD-10-CM | POA: Diagnosis not present

## 2019-10-02 DIAGNOSIS — R278 Other lack of coordination: Secondary | ICD-10-CM | POA: Diagnosis not present

## 2019-10-02 DIAGNOSIS — U071 COVID-19: Secondary | ICD-10-CM | POA: Diagnosis not present

## 2019-10-02 DIAGNOSIS — M6281 Muscle weakness (generalized): Secondary | ICD-10-CM | POA: Diagnosis not present

## 2019-10-02 DIAGNOSIS — Z20828 Contact with and (suspected) exposure to other viral communicable diseases: Secondary | ICD-10-CM | POA: Diagnosis not present

## 2019-10-03 DIAGNOSIS — U071 COVID-19: Secondary | ICD-10-CM | POA: Diagnosis not present

## 2019-10-03 DIAGNOSIS — Z20828 Contact with and (suspected) exposure to other viral communicable diseases: Secondary | ICD-10-CM | POA: Diagnosis not present

## 2019-10-04 ENCOUNTER — Emergency Department (HOSPITAL_COMMUNITY)
Admission: EM | Admit: 2019-10-04 | Discharge: 2019-10-04 | Disposition: A | Discharge status: Home / Self Care | Payer: PPO | Attending: Emergency Medicine | Admitting: Emergency Medicine

## 2019-10-04 ENCOUNTER — Emergency Department (HOSPITAL_COMMUNITY): Payer: PPO

## 2019-10-04 ENCOUNTER — Other Ambulatory Visit: Payer: Self-pay

## 2019-10-04 ENCOUNTER — Encounter (HOSPITAL_COMMUNITY): Payer: Self-pay | Admitting: Emergency Medicine

## 2019-10-04 DIAGNOSIS — I129 Hypertensive chronic kidney disease with stage 1 through stage 4 chronic kidney disease, or unspecified chronic kidney disease: Secondary | ICD-10-CM | POA: Insufficient documentation

## 2019-10-04 DIAGNOSIS — F0390 Unspecified dementia without behavioral disturbance: Secondary | ICD-10-CM | POA: Diagnosis not present

## 2019-10-04 DIAGNOSIS — Z87891 Personal history of nicotine dependence: Secondary | ICD-10-CM | POA: Diagnosis not present

## 2019-10-04 DIAGNOSIS — I10 Essential (primary) hypertension: Secondary | ICD-10-CM | POA: Diagnosis not present

## 2019-10-04 DIAGNOSIS — R4182 Altered mental status, unspecified: Secondary | ICD-10-CM | POA: Diagnosis not present

## 2019-10-04 DIAGNOSIS — R918 Other nonspecific abnormal finding of lung field: Secondary | ICD-10-CM | POA: Diagnosis not present

## 2019-10-04 DIAGNOSIS — I2510 Atherosclerotic heart disease of native coronary artery without angina pectoris: Secondary | ICD-10-CM | POA: Insufficient documentation

## 2019-10-04 DIAGNOSIS — Z79899 Other long term (current) drug therapy: Secondary | ICD-10-CM | POA: Insufficient documentation

## 2019-10-04 DIAGNOSIS — Z794 Long term (current) use of insulin: Secondary | ICD-10-CM | POA: Insufficient documentation

## 2019-10-04 DIAGNOSIS — R001 Bradycardia, unspecified: Secondary | ICD-10-CM | POA: Diagnosis not present

## 2019-10-04 DIAGNOSIS — N183 Chronic kidney disease, stage 3 unspecified: Secondary | ICD-10-CM | POA: Insufficient documentation

## 2019-10-04 DIAGNOSIS — M6281 Muscle weakness (generalized): Secondary | ICD-10-CM | POA: Diagnosis not present

## 2019-10-04 DIAGNOSIS — E1122 Type 2 diabetes mellitus with diabetic chronic kidney disease: Secondary | ICD-10-CM | POA: Diagnosis not present

## 2019-10-04 DIAGNOSIS — R0689 Other abnormalities of breathing: Secondary | ICD-10-CM | POA: Diagnosis not present

## 2019-10-04 DIAGNOSIS — R278 Other lack of coordination: Secondary | ICD-10-CM | POA: Diagnosis not present

## 2019-10-04 DIAGNOSIS — R2689 Other abnormalities of gait and mobility: Secondary | ICD-10-CM | POA: Diagnosis not present

## 2019-10-04 HISTORY — DX: Atherosclerotic heart disease of native coronary artery without angina pectoris: I25.10

## 2019-10-04 LAB — CBC WITH DIFFERENTIAL/PLATELET
Abs Immature Granulocytes: 0.08 10*3/uL — ABNORMAL HIGH (ref 0.00–0.07)
Basophils Absolute: 0 10*3/uL (ref 0.0–0.1)
Basophils Relative: 0 %
Eosinophils Absolute: 0.2 10*3/uL (ref 0.0–0.5)
Eosinophils Relative: 2 %
HCT: 43.6 % (ref 39.0–52.0)
Hemoglobin: 14.5 g/dL (ref 13.0–17.0)
Immature Granulocytes: 1 %
Lymphocytes Relative: 19 %
Lymphs Abs: 2.2 10*3/uL (ref 0.7–4.0)
MCH: 31 pg (ref 26.0–34.0)
MCHC: 33.3 g/dL (ref 30.0–36.0)
MCV: 93.4 fL (ref 80.0–100.0)
Monocytes Absolute: 1 10*3/uL (ref 0.1–1.0)
Monocytes Relative: 9 %
Neutro Abs: 8.1 10*3/uL — ABNORMAL HIGH (ref 1.7–7.7)
Neutrophils Relative %: 69 %
Platelets: 233 10*3/uL (ref 150–400)
RBC: 4.67 MIL/uL (ref 4.22–5.81)
RDW: 13.1 % (ref 11.5–15.5)
WBC: 11.6 10*3/uL — ABNORMAL HIGH (ref 4.0–10.5)
nRBC: 0 % (ref 0.0–0.2)

## 2019-10-04 LAB — URINALYSIS, ROUTINE W REFLEX MICROSCOPIC
Bilirubin Urine: NEGATIVE
Glucose, UA: 50 mg/dL — AB
Hgb urine dipstick: NEGATIVE
Ketones, ur: NEGATIVE mg/dL
Leukocytes,Ua: NEGATIVE
Nitrite: NEGATIVE
Protein, ur: NEGATIVE mg/dL
Specific Gravity, Urine: 1.024 (ref 1.005–1.030)
pH: 5 (ref 5.0–8.0)

## 2019-10-04 LAB — COMPREHENSIVE METABOLIC PANEL
ALT: 21 U/L (ref 0–44)
AST: 17 U/L (ref 15–41)
Albumin: 3.7 g/dL (ref 3.5–5.0)
Alkaline Phosphatase: 59 U/L (ref 38–126)
Anion gap: 7 (ref 5–15)
BUN: 15 mg/dL (ref 8–23)
CO2: 26 mmol/L (ref 22–32)
Calcium: 9 mg/dL (ref 8.9–10.3)
Chloride: 104 mmol/L (ref 98–111)
Creatinine, Ser: 0.95 mg/dL (ref 0.61–1.24)
GFR calc Af Amer: >60 mL/min (ref >60)
GFR calc non Af Amer: >60 mL/min (ref >60)
Glucose, Bld: 193 mg/dL — ABNORMAL HIGH (ref 70–99)
Potassium: 4.6 mmol/L (ref 3.5–5.1)
Sodium: 137 mmol/L (ref 135–145)
Total Bilirubin: 0.9 mg/dL (ref 0.3–1.2)
Total Protein: 6.7 g/dL (ref 6.5–8.1)

## 2019-10-04 NOTE — ED Triage Notes (Signed)
Per ems, pt last seen normal approximately 1500 yesterday (10/03/19). Staff reported at [facility] that pt was altered and stumbling around today. Pt states "ive felt like this for awhile now." Alert and oriented x2. cbg en route 187. Pt hx dementia at baseline.

## 2019-10-04 NOTE — ED Notes (Signed)
Pt has been adequately discharged and dressed. [facility] [place]

## 2019-10-04 NOTE — Discharge Instructions (Addendum)
Patient stable for return to [facility] [place] facility.  Work-up for altered mental status and also concerns with walking did not come to tuition here.  Lab work-up CT head chest x-ray and patient ambulating fine.  Patient seems to be stable to return back to the nursing facility and patient wants to go back to the nursing facility.

## 2019-10-11 DIAGNOSIS — M6281 Muscle weakness (generalized): Secondary | ICD-10-CM | POA: Diagnosis not present

## 2019-10-11 DIAGNOSIS — R2689 Other abnormalities of gait and mobility: Secondary | ICD-10-CM | POA: Diagnosis not present

## 2019-10-11 DIAGNOSIS — R278 Other lack of coordination: Secondary | ICD-10-CM | POA: Diagnosis not present

## 2019-10-13 DIAGNOSIS — M6281 Muscle weakness (generalized): Secondary | ICD-10-CM | POA: Diagnosis not present

## 2019-10-13 DIAGNOSIS — R278 Other lack of coordination: Secondary | ICD-10-CM | POA: Diagnosis not present

## 2019-10-13 DIAGNOSIS — R2689 Other abnormalities of gait and mobility: Secondary | ICD-10-CM | POA: Diagnosis not present

## 2019-10-16 DIAGNOSIS — R278 Other lack of coordination: Secondary | ICD-10-CM | POA: Diagnosis not present

## 2019-10-16 DIAGNOSIS — M6281 Muscle weakness (generalized): Secondary | ICD-10-CM | POA: Diagnosis not present

## 2019-10-16 DIAGNOSIS — R2689 Other abnormalities of gait and mobility: Secondary | ICD-10-CM | POA: Diagnosis not present

## 2019-10-18 DIAGNOSIS — M6281 Muscle weakness (generalized): Secondary | ICD-10-CM | POA: Diagnosis not present

## 2019-10-18 DIAGNOSIS — R278 Other lack of coordination: Secondary | ICD-10-CM | POA: Diagnosis not present

## 2019-10-18 DIAGNOSIS — R2689 Other abnormalities of gait and mobility: Secondary | ICD-10-CM | POA: Diagnosis not present

## 2019-10-19 NOTE — ED Provider Notes (Signed)
[facility] EMERGENCY DEPARTMENT Provider Note   CSN: [id number] Arrival date & time: 10/04/19  1232     History Chief Complaint  Patient presents with  . Altered Mental Status    Jacob Little is a 84 y.o. male.  Patient brought in by EMS from [facility] facility.  They stated he was last normal yesterday at 3 PM.  Staff reported that he was altered and stumbling around today.  Patient states he felt like this for a while now.  Alert and oriented x2 blood sugars in route were 187.  Patient has a history of dementia.  In discussion with nursing facility and EMS he is at baseline.  Past medical history significant for coronary artery disease type 2 diabetes hypertension hyperlipidemia.  Prior stroke.        Past Medical History:  Diagnosis Date  . Atherosclerotic heart disease   . Coronary artery disease    DES to circumflex 2006  . Dementia ([place])   . Encephalopathy   . Essential hypertension   . Hyperlipidemia   . Rheumatic fever   . Stroke ([place])   . Type 2 diabetes mellitus [facility])     Patient Active Problem List   Diagnosis Date Noted  . Viral bronchitis 03/16/2018  . Tremor of left hand 03/16/2018  . 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 Fever with Metabolic encephalopathy [id number]  . Acute metabolic encephalopathy [id number]  . Dehydration   . Orthostatic hypotension   . Generalized weakness 01/05/2018  . Acute renal failure superimposed on stage 3 chronic kidney disease ([place]) 01/05/2018  . Lactic acidosis 01/05/2018  . Diarrhea 01/05/2018  . Vascular dementia with behavior disturbance ([place])   . Acute embolic stroke ([place]) [id number]  . Encephalopathy 11/08/2017  . Acute ischemic stroke ([place]) 11/08/2017  . Essential hypertension 11/08/2017  . Diabetes mellitus with hyperglycemia, without long-term current use of insulin ([place]) 11/08/2017  . Acute ischemic left MCA stroke ([place]) 11/08/2017  . DENTAL PAIN 05/12/2010  . CORONARY  ATHEROSCLEROSIS NATIVE CORONARY ARTERY 07/22/2009  . DM 07/12/2009  . HLD (hyperlipidemia) 07/12/2009  . HYPERTENSION 07/12/2009  . ANGINA, ATYPICAL 07/12/2009    Past Surgical History:  Procedure Laterality Date  . APPENDECTOMY    . CORONARY ANGIOPLASTY WITH STENT PLACEMENT         History reviewed. No pertinent family history.  Social History   Tobacco Use  . Smoking status: Former Smoker    Types: Cigarettes    Quit date: 06/15/2005    Years since quitting: 14.3  . Smokeless tobacco: Never Used  Substance Use Topics  . Alcohol use: No    Alcohol/week: 0.0 standard drinks  . Drug use: No    Home Medications Prior to Admission medications   Medication Sig Start Date End Date Taking? Authorizing Provider  acetaminophen (TYLENOL) 325 MG tablet Take 2 tablets (650 mg total) by mouth every 4 (four) hours as needed for mild pain (or temp > 37.5 C (99.5 F)). 11/12/17  Yes Barton Dubois, MD  albuterol (PROVENTIL HFA;VENTOLIN HFA) 108 (90 Base) MCG/ACT inhaler Inhale 2 puffs into the lungs every 6 (six) hours as needed for wheezing or shortness of breath. 03/16/18  Yes Memon, Jolaine Artist, MD  ASPIRIN LOW DOSE 81 MG EC tablet TAKE (1) TABLET BY MOUTH ONCE DAILY. 09/25/19  Yes Satira Sark, MD  atorvastatin (LIPITOR) 20 MG tablet Take 20 mg by mouth daily.   Yes [provider]  cyanocobalamin (,VITAMIN  B-12,) 1000 MCG/ML injection Inject 1,000 mcg into the muscle every 30 (thirty) days.  09/14/17  Yes [provider]  glimepiride (AMARYL) 1 MG tablet Take 1 tablet (1 mg total) by mouth every morning. 01/07/18 10/04/19 Yes Barton Dubois, MD  insulin glargine (LANTUS) 100 UNIT/ML injection Inject 20 Units into the skin daily.    Yes [provider]  isosorbide mononitrate (IMDUR) 30 MG 24 hr tablet Take 0.5 tablets (15 mg total) by mouth 2 (two) times daily. 01/07/18 10/04/19 Yes Barton Dubois, MD  JANUVIA 100 MG tablet Take 1 tablet by mouth daily. 11/04/18  Yes  [provider]  metoprolol tartrate (LOPRESSOR) 25 MG tablet Take 0.5 tablets (12.5 mg total) by mouth 2 (two) times daily. 11/12/17  Yes Barton Dubois, MD  nitroGLYCERIN (NITROSTAT) 0.4 MG SL tablet Place 0.4 mg under the tongue every 5 (five) minutes as needed for chest pain.   Yes [provider]  risperiDONE (RISPERDAL) 0.25 MG tablet Take 1 tablet by mouth daily. 11/04/18  Yes [provider]  ciprofloxacin (CIPRO) 500 MG tablet Take 1 tablet by mouth in the morning and at bedtime. 10/04/19   [provider]  ondansetron (ZOFRAN ODT) 8 MG disintegrating tablet Take 1 tablet (8 mg total) by mouth every 8 (eight) hours as needed for nausea. Patient not taking: Reported on 10/04/2019 02/24/19   Varney Biles, MD    Allergies    Patient has no known allergies.  Review of Systems   Review of Systems  Unable to perform ROS: Dementia    Physical Exam Updated Vital Signs BP 130/60   Pulse (!) 53   Temp 98.3 F (36.8 C) (Oral)   Resp 14 Comment: Simultaneous filing. User may not have seen previous data.  Ht 1.727 m (5\' 8" )   Wt 67 kg   SpO2 100%   BMI 22.46 kg/m   Physical Exam Vitals and nursing note reviewed.  Constitutional:      Appearance: Normal appearance. He is well-developed.  HENT:     Head: Normocephalic and atraumatic.  Eyes:     Extraocular Movements: Extraocular movements intact.     Conjunctiva/sclera: Conjunctivae normal.     Pupils: Pupils are equal, round, and reactive to light.  Cardiovascular:     Rate and Rhythm: Normal rate and regular rhythm.     Heart sounds: No murmur.  Pulmonary:     Effort: Pulmonary effort is normal. No respiratory distress.     Breath sounds: Normal breath sounds.  Abdominal:     Palpations: Abdomen is soft.     Tenderness: There is no abdominal tenderness.  Musculoskeletal:        General: Normal range of motion.     Cervical back: Normal range of motion and neck supple.  Skin:     General: Skin is warm and dry.     Capillary Refill: Capillary refill takes less than 2 seconds.  Neurological:     General: No focal deficit present.     Mental Status: He is alert. Mental status is at baseline.     Cranial Nerves: No cranial nerve deficit.     Sensory: No sensory deficit.     Motor: No weakness.     ED Results / Procedures / Treatments   Labs (all labs ordered are listed, but only abnormal results are displayed) Labs Reviewed  COMPREHENSIVE METABOLIC PANEL - Abnormal; Notable for the following components:      Result Value   Glucose,  Bld 193 (*)    All other components within normal limits  CBC WITH DIFFERENTIAL/PLATELET - Abnormal; Notable for the following components:   WBC 11.6 (*)    Neutro Abs 8.1 (*)    Abs Immature Granulocytes 0.08 (*)    All other components within normal limits  URINALYSIS, ROUTINE W REFLEX MICROSCOPIC - Abnormal; Notable for the following components:   Glucose, UA 50 (*)    All other components within normal limits    EKG EKG Interpretation  Date/Time:  Wednesday October 04 2019 12:53:37 EDT Ventricular Rate:  55 PR Interval:    QRS Duration: 143 QT Interval:  459 QTC Calculation: 439 R Axis:   -82 Text Interpretation: Sinus rhythm Right bundle branch block Inferior infarct, old Confirmed by Fredia Sorrow [phone]) on 10/04/2019 12:57:07 PM   Radiology No results found.  Procedures Procedures (including critical care time)  Medications Ordered in ED Medications - No data to display  ED Course  I have reviewed the triage vital signs and the nursing notes.  Pertinent labs & imaging results that were available during my care of the patient were reviewed by me and considered in my medical decision making (see chart for details).    MDM Rules/Calculators/A&P                      Patient with known history of dementia.  Patient from [facility] facility.  Was sent in was last known normal at 3 PM yesterday.   Staff reported the patient seemed to be altered and stumbling around today.  Patient states it felt like this for a while now.  Patient alert and oriented x2 his blood sugars fine history of dementia at baseline.  Extensive work-up urinalysis CMP and CBC.  Significant for mild leukocytosis with a white count of 11.6.  No evidence of urinary tract infection. CT head without any acute intracranial abnormalities.  Chest x-ray without any active disease.  Patient seems to be fairly functional.  Baseline for his dementia.  Stable for discharge back to [facility]. Final Clinical Impression(s) / ED Diagnoses Final diagnoses:  Dementia without behavioral disturbance, unspecified dementia type [facility])    Rx / DC Orders ED Discharge Orders    None       Fredia Sorrow, MD 10/19/19 1902

## 2019-10-23 DIAGNOSIS — E1129 Type 2 diabetes mellitus with other diabetic kidney complication: Secondary | ICD-10-CM | POA: Diagnosis not present

## 2019-10-23 DIAGNOSIS — R2689 Other abnormalities of gait and mobility: Secondary | ICD-10-CM | POA: Diagnosis not present

## 2019-10-23 DIAGNOSIS — M6281 Muscle weakness (generalized): Secondary | ICD-10-CM | POA: Diagnosis not present

## 2019-10-23 DIAGNOSIS — R278 Other lack of coordination: Secondary | ICD-10-CM | POA: Diagnosis not present

## 2019-10-25 DIAGNOSIS — M6281 Muscle weakness (generalized): Secondary | ICD-10-CM | POA: Diagnosis not present

## 2019-10-25 DIAGNOSIS — R2689 Other abnormalities of gait and mobility: Secondary | ICD-10-CM | POA: Diagnosis not present

## 2019-10-25 DIAGNOSIS — R278 Other lack of coordination: Secondary | ICD-10-CM | POA: Diagnosis not present

## 2019-10-30 DIAGNOSIS — Z6822 Body mass index (BMI) 22.0-22.9, adult: Secondary | ICD-10-CM | POA: Diagnosis not present

## 2019-10-30 DIAGNOSIS — F0150 Vascular dementia without behavioral disturbance: Secondary | ICD-10-CM | POA: Diagnosis not present

## 2019-10-30 DIAGNOSIS — E1129 Type 2 diabetes mellitus with other diabetic kidney complication: Secondary | ICD-10-CM | POA: Diagnosis not present

## 2019-10-30 DIAGNOSIS — I2510 Atherosclerotic heart disease of native coronary artery without angina pectoris: Secondary | ICD-10-CM | POA: Diagnosis not present

## 2019-11-06 DIAGNOSIS — R2689 Other abnormalities of gait and mobility: Secondary | ICD-10-CM | POA: Diagnosis not present

## 2019-11-06 DIAGNOSIS — M6281 Muscle weakness (generalized): Secondary | ICD-10-CM | POA: Diagnosis not present

## 2019-11-06 DIAGNOSIS — R278 Other lack of coordination: Secondary | ICD-10-CM | POA: Diagnosis not present

## 2019-11-08 DIAGNOSIS — R278 Other lack of coordination: Secondary | ICD-10-CM | POA: Diagnosis not present

## 2019-11-08 DIAGNOSIS — R2689 Other abnormalities of gait and mobility: Secondary | ICD-10-CM | POA: Diagnosis not present

## 2019-11-08 DIAGNOSIS — M6281 Muscle weakness (generalized): Secondary | ICD-10-CM | POA: Diagnosis not present

## 2019-11-15 DIAGNOSIS — R2689 Other abnormalities of gait and mobility: Secondary | ICD-10-CM | POA: Diagnosis not present

## 2019-11-15 DIAGNOSIS — R278 Other lack of coordination: Secondary | ICD-10-CM | POA: Diagnosis not present

## 2019-11-15 DIAGNOSIS — M6281 Muscle weakness (generalized): Secondary | ICD-10-CM | POA: Diagnosis not present

## 2019-11-17 DIAGNOSIS — R2689 Other abnormalities of gait and mobility: Secondary | ICD-10-CM | POA: Diagnosis not present

## 2019-11-17 DIAGNOSIS — R278 Other lack of coordination: Secondary | ICD-10-CM | POA: Diagnosis not present

## 2019-11-17 DIAGNOSIS — M6281 Muscle weakness (generalized): Secondary | ICD-10-CM | POA: Diagnosis not present

## 2019-11-20 DIAGNOSIS — M6281 Muscle weakness (generalized): Secondary | ICD-10-CM | POA: Diagnosis not present

## 2019-11-20 DIAGNOSIS — R2689 Other abnormalities of gait and mobility: Secondary | ICD-10-CM | POA: Diagnosis not present

## 2019-11-20 DIAGNOSIS — R278 Other lack of coordination: Secondary | ICD-10-CM | POA: Diagnosis not present

## 2019-11-22 DIAGNOSIS — R2689 Other abnormalities of gait and mobility: Secondary | ICD-10-CM | POA: Diagnosis not present

## 2019-11-22 DIAGNOSIS — R278 Other lack of coordination: Secondary | ICD-10-CM | POA: Diagnosis not present

## 2019-11-22 DIAGNOSIS — M6281 Muscle weakness (generalized): Secondary | ICD-10-CM | POA: Diagnosis not present

## 2019-11-30 DIAGNOSIS — R2689 Other abnormalities of gait and mobility: Secondary | ICD-10-CM | POA: Diagnosis not present

## 2019-11-30 DIAGNOSIS — M6281 Muscle weakness (generalized): Secondary | ICD-10-CM | POA: Diagnosis not present

## 2019-11-30 DIAGNOSIS — R278 Other lack of coordination: Secondary | ICD-10-CM | POA: Diagnosis not present

## 2019-12-04 DIAGNOSIS — M6281 Muscle weakness (generalized): Secondary | ICD-10-CM | POA: Diagnosis not present

## 2019-12-04 DIAGNOSIS — R278 Other lack of coordination: Secondary | ICD-10-CM | POA: Diagnosis not present

## 2019-12-04 DIAGNOSIS — R2689 Other abnormalities of gait and mobility: Secondary | ICD-10-CM | POA: Diagnosis not present

## 2019-12-05 DIAGNOSIS — R278 Other lack of coordination: Secondary | ICD-10-CM | POA: Diagnosis not present

## 2019-12-06 DIAGNOSIS — R278 Other lack of coordination: Secondary | ICD-10-CM | POA: Diagnosis not present

## 2019-12-06 DIAGNOSIS — M6281 Muscle weakness (generalized): Secondary | ICD-10-CM | POA: Diagnosis not present

## 2019-12-06 DIAGNOSIS — R2689 Other abnormalities of gait and mobility: Secondary | ICD-10-CM | POA: Diagnosis not present

## 2019-12-12 DIAGNOSIS — R278 Other lack of coordination: Secondary | ICD-10-CM | POA: Diagnosis not present

## 2019-12-14 DIAGNOSIS — R278 Other lack of coordination: Secondary | ICD-10-CM | POA: Diagnosis not present

## 2019-12-18 DIAGNOSIS — R2689 Other abnormalities of gait and mobility: Secondary | ICD-10-CM | POA: Diagnosis not present

## 2019-12-18 DIAGNOSIS — M6281 Muscle weakness (generalized): Secondary | ICD-10-CM | POA: Diagnosis not present

## 2019-12-18 DIAGNOSIS — R278 Other lack of coordination: Secondary | ICD-10-CM | POA: Diagnosis not present

## 2019-12-19 DIAGNOSIS — R278 Other lack of coordination: Secondary | ICD-10-CM | POA: Diagnosis not present

## 2019-12-20 DIAGNOSIS — R2689 Other abnormalities of gait and mobility: Secondary | ICD-10-CM | POA: Diagnosis not present

## 2019-12-20 DIAGNOSIS — M6281 Muscle weakness (generalized): Secondary | ICD-10-CM | POA: Diagnosis not present

## 2019-12-20 DIAGNOSIS — R278 Other lack of coordination: Secondary | ICD-10-CM | POA: Diagnosis not present

## 2019-12-21 DIAGNOSIS — R278 Other lack of coordination: Secondary | ICD-10-CM | POA: Diagnosis not present

## 2019-12-26 DIAGNOSIS — R278 Other lack of coordination: Secondary | ICD-10-CM | POA: Diagnosis not present

## 2019-12-28 DIAGNOSIS — R278 Other lack of coordination: Secondary | ICD-10-CM | POA: Diagnosis not present

## 2020-01-09 DIAGNOSIS — B353 Tinea pedis: Secondary | ICD-10-CM | POA: Diagnosis not present

## 2020-01-09 DIAGNOSIS — R05 Cough: Secondary | ICD-10-CM | POA: Diagnosis not present

## 2020-01-11 DIAGNOSIS — R278 Other lack of coordination: Secondary | ICD-10-CM | POA: Diagnosis not present

## 2020-01-12 DIAGNOSIS — R278 Other lack of coordination: Secondary | ICD-10-CM | POA: Diagnosis not present

## 2020-01-16 DIAGNOSIS — R278 Other lack of coordination: Secondary | ICD-10-CM | POA: Diagnosis not present

## 2020-01-19 DIAGNOSIS — R278 Other lack of coordination: Secondary | ICD-10-CM | POA: Diagnosis not present

## 2020-01-23 DIAGNOSIS — R278 Other lack of coordination: Secondary | ICD-10-CM | POA: Diagnosis not present

## 2020-01-24 DIAGNOSIS — E1129 Type 2 diabetes mellitus with other diabetic kidney complication: Secondary | ICD-10-CM | POA: Diagnosis not present

## 2020-01-25 DIAGNOSIS — R278 Other lack of coordination: Secondary | ICD-10-CM | POA: Diagnosis not present

## 2020-01-30 DIAGNOSIS — Z20828 Contact with and (suspected) exposure to other viral communicable diseases: Secondary | ICD-10-CM | POA: Diagnosis not present

## 2020-01-30 DIAGNOSIS — U071 COVID-19: Secondary | ICD-10-CM | POA: Diagnosis not present

## 2020-02-01 DIAGNOSIS — E1129 Type 2 diabetes mellitus with other diabetic kidney complication: Secondary | ICD-10-CM | POA: Diagnosis not present

## 2020-02-01 DIAGNOSIS — Z823 Family history of stroke: Secondary | ICD-10-CM | POA: Diagnosis not present

## 2020-02-01 DIAGNOSIS — F0150 Vascular dementia without behavioral disturbance: Secondary | ICD-10-CM | POA: Diagnosis not present

## 2020-02-06 DIAGNOSIS — Z20828 Contact with and (suspected) exposure to other viral communicable diseases: Secondary | ICD-10-CM | POA: Diagnosis not present

## 2020-02-06 DIAGNOSIS — U071 COVID-19: Secondary | ICD-10-CM | POA: Diagnosis not present

## 2020-02-07 DIAGNOSIS — U071 COVID-19: Secondary | ICD-10-CM | POA: Diagnosis not present

## 2020-02-07 DIAGNOSIS — Z20828 Contact with and (suspected) exposure to other viral communicable diseases: Secondary | ICD-10-CM | POA: Diagnosis not present

## 2020-02-08 DIAGNOSIS — U071 COVID-19: Secondary | ICD-10-CM | POA: Diagnosis not present

## 2020-02-08 DIAGNOSIS — Z20828 Contact with and (suspected) exposure to other viral communicable diseases: Secondary | ICD-10-CM | POA: Diagnosis not present

## 2020-02-13 DIAGNOSIS — Z20828 Contact with and (suspected) exposure to other viral communicable diseases: Secondary | ICD-10-CM | POA: Diagnosis not present

## 2020-02-13 DIAGNOSIS — U071 COVID-19: Secondary | ICD-10-CM | POA: Diagnosis not present

## 2020-02-15 DIAGNOSIS — Z20828 Contact with and (suspected) exposure to other viral communicable diseases: Secondary | ICD-10-CM | POA: Diagnosis not present

## 2020-02-15 DIAGNOSIS — U071 COVID-19: Secondary | ICD-10-CM | POA: Diagnosis not present

## 2020-02-20 DIAGNOSIS — U071 COVID-19: Secondary | ICD-10-CM | POA: Diagnosis not present

## 2020-02-20 DIAGNOSIS — Z20828 Contact with and (suspected) exposure to other viral communicable diseases: Secondary | ICD-10-CM | POA: Diagnosis not present

## 2020-02-21 DIAGNOSIS — U071 COVID-19: Secondary | ICD-10-CM | POA: Diagnosis not present

## 2020-02-21 DIAGNOSIS — Z20828 Contact with and (suspected) exposure to other viral communicable diseases: Secondary | ICD-10-CM | POA: Diagnosis not present

## 2020-02-22 DIAGNOSIS — Z20828 Contact with and (suspected) exposure to other viral communicable diseases: Secondary | ICD-10-CM | POA: Diagnosis not present

## 2020-02-22 DIAGNOSIS — U071 COVID-19: Secondary | ICD-10-CM | POA: Diagnosis not present

## 2020-02-26 DIAGNOSIS — Z20828 Contact with and (suspected) exposure to other viral communicable diseases: Secondary | ICD-10-CM | POA: Diagnosis not present

## 2020-02-26 DIAGNOSIS — U071 COVID-19: Secondary | ICD-10-CM | POA: Diagnosis not present

## 2020-02-29 DIAGNOSIS — Z20828 Contact with and (suspected) exposure to other viral communicable diseases: Secondary | ICD-10-CM | POA: Diagnosis not present

## 2020-02-29 DIAGNOSIS — U071 COVID-19: Secondary | ICD-10-CM | POA: Diagnosis not present

## 2020-03-05 DIAGNOSIS — Z20828 Contact with and (suspected) exposure to other viral communicable diseases: Secondary | ICD-10-CM | POA: Diagnosis not present

## 2020-03-05 DIAGNOSIS — U071 COVID-19: Secondary | ICD-10-CM | POA: Diagnosis not present

## 2020-03-07 DIAGNOSIS — Z20828 Contact with and (suspected) exposure to other viral communicable diseases: Secondary | ICD-10-CM | POA: Diagnosis not present

## 2020-03-07 DIAGNOSIS — U071 COVID-19: Secondary | ICD-10-CM | POA: Diagnosis not present

## 2020-03-12 DIAGNOSIS — U071 COVID-19: Secondary | ICD-10-CM | POA: Diagnosis not present

## 2020-03-12 DIAGNOSIS — Z20828 Contact with and (suspected) exposure to other viral communicable diseases: Secondary | ICD-10-CM | POA: Diagnosis not present

## 2020-03-14 DIAGNOSIS — Z20828 Contact with and (suspected) exposure to other viral communicable diseases: Secondary | ICD-10-CM | POA: Diagnosis not present

## 2020-03-14 DIAGNOSIS — U071 COVID-19: Secondary | ICD-10-CM | POA: Diagnosis not present

## 2020-03-19 DIAGNOSIS — U071 COVID-19: Secondary | ICD-10-CM | POA: Diagnosis not present

## 2020-03-19 DIAGNOSIS — Z20828 Contact with and (suspected) exposure to other viral communicable diseases: Secondary | ICD-10-CM | POA: Diagnosis not present

## 2020-03-21 DIAGNOSIS — U071 COVID-19: Secondary | ICD-10-CM | POA: Diagnosis not present

## 2020-03-21 DIAGNOSIS — Z20828 Contact with and (suspected) exposure to other viral communicable diseases: Secondary | ICD-10-CM | POA: Diagnosis not present

## 2020-03-26 DIAGNOSIS — Z20828 Contact with and (suspected) exposure to other viral communicable diseases: Secondary | ICD-10-CM | POA: Diagnosis not present

## 2020-03-26 DIAGNOSIS — U071 COVID-19: Secondary | ICD-10-CM | POA: Diagnosis not present

## 2020-03-28 DIAGNOSIS — Z20828 Contact with and (suspected) exposure to other viral communicable diseases: Secondary | ICD-10-CM | POA: Diagnosis not present

## 2020-03-28 DIAGNOSIS — U071 COVID-19: Secondary | ICD-10-CM | POA: Diagnosis not present

## 2020-04-02 DIAGNOSIS — Z20828 Contact with and (suspected) exposure to other viral communicable diseases: Secondary | ICD-10-CM | POA: Diagnosis not present

## 2020-04-02 DIAGNOSIS — U071 COVID-19: Secondary | ICD-10-CM | POA: Diagnosis not present

## 2020-04-04 DIAGNOSIS — Z23 Encounter for immunization: Secondary | ICD-10-CM | POA: Diagnosis not present

## 2020-04-04 DIAGNOSIS — Z20828 Contact with and (suspected) exposure to other viral communicable diseases: Secondary | ICD-10-CM | POA: Diagnosis not present

## 2020-04-04 DIAGNOSIS — U071 COVID-19: Secondary | ICD-10-CM | POA: Diagnosis not present

## 2020-04-09 DIAGNOSIS — U071 COVID-19: Secondary | ICD-10-CM | POA: Diagnosis not present

## 2020-04-09 DIAGNOSIS — Z20828 Contact with and (suspected) exposure to other viral communicable diseases: Secondary | ICD-10-CM | POA: Diagnosis not present

## 2020-04-11 DIAGNOSIS — U071 COVID-19: Secondary | ICD-10-CM | POA: Diagnosis not present

## 2020-04-11 DIAGNOSIS — Z20828 Contact with and (suspected) exposure to other viral communicable diseases: Secondary | ICD-10-CM | POA: Diagnosis not present

## 2020-04-16 DIAGNOSIS — U071 COVID-19: Secondary | ICD-10-CM | POA: Diagnosis not present

## 2020-04-16 DIAGNOSIS — Z20828 Contact with and (suspected) exposure to other viral communicable diseases: Secondary | ICD-10-CM | POA: Diagnosis not present

## 2020-04-18 DIAGNOSIS — Z20828 Contact with and (suspected) exposure to other viral communicable diseases: Secondary | ICD-10-CM | POA: Diagnosis not present

## 2020-04-18 DIAGNOSIS — U071 COVID-19: Secondary | ICD-10-CM | POA: Diagnosis not present

## 2020-04-23 DIAGNOSIS — Z20828 Contact with and (suspected) exposure to other viral communicable diseases: Secondary | ICD-10-CM | POA: Diagnosis not present

## 2020-04-23 DIAGNOSIS — U071 COVID-19: Secondary | ICD-10-CM | POA: Diagnosis not present

## 2020-04-25 DIAGNOSIS — U071 COVID-19: Secondary | ICD-10-CM | POA: Diagnosis not present

## 2020-04-25 DIAGNOSIS — Z20828 Contact with and (suspected) exposure to other viral communicable diseases: Secondary | ICD-10-CM | POA: Diagnosis not present

## 2020-04-30 DIAGNOSIS — U071 COVID-19: Secondary | ICD-10-CM | POA: Diagnosis not present

## 2020-04-30 DIAGNOSIS — Z20828 Contact with and (suspected) exposure to other viral communicable diseases: Secondary | ICD-10-CM | POA: Diagnosis not present

## 2020-05-02 DIAGNOSIS — U071 COVID-19: Secondary | ICD-10-CM | POA: Diagnosis not present

## 2020-05-02 DIAGNOSIS — Z20828 Contact with and (suspected) exposure to other viral communicable diseases: Secondary | ICD-10-CM | POA: Diagnosis not present

## 2020-05-07 DIAGNOSIS — U071 COVID-19: Secondary | ICD-10-CM | POA: Diagnosis not present

## 2020-05-07 DIAGNOSIS — Z20828 Contact with and (suspected) exposure to other viral communicable diseases: Secondary | ICD-10-CM | POA: Diagnosis not present

## 2020-05-14 DIAGNOSIS — U071 COVID-19: Secondary | ICD-10-CM | POA: Diagnosis not present

## 2020-05-14 DIAGNOSIS — Z20828 Contact with and (suspected) exposure to other viral communicable diseases: Secondary | ICD-10-CM | POA: Diagnosis not present

## 2020-05-16 DIAGNOSIS — Z20828 Contact with and (suspected) exposure to other viral communicable diseases: Secondary | ICD-10-CM | POA: Diagnosis not present

## 2020-05-16 DIAGNOSIS — U071 COVID-19: Secondary | ICD-10-CM | POA: Diagnosis not present

## 2020-05-17 DIAGNOSIS — E1129 Type 2 diabetes mellitus with other diabetic kidney complication: Secondary | ICD-10-CM | POA: Diagnosis not present

## 2020-05-17 DIAGNOSIS — F0150 Vascular dementia without behavioral disturbance: Secondary | ICD-10-CM | POA: Diagnosis not present

## 2020-05-17 DIAGNOSIS — Z79899 Other long term (current) drug therapy: Secondary | ICD-10-CM | POA: Diagnosis not present

## 2020-05-17 DIAGNOSIS — I6300 Cerebral infarction due to thrombosis of unspecified precerebral artery: Secondary | ICD-10-CM | POA: Diagnosis not present

## 2020-05-21 DIAGNOSIS — Z20828 Contact with and (suspected) exposure to other viral communicable diseases: Secondary | ICD-10-CM | POA: Diagnosis not present

## 2020-05-21 DIAGNOSIS — U071 COVID-19: Secondary | ICD-10-CM | POA: Diagnosis not present

## 2020-05-23 DIAGNOSIS — Z20828 Contact with and (suspected) exposure to other viral communicable diseases: Secondary | ICD-10-CM | POA: Diagnosis not present

## 2020-05-23 DIAGNOSIS — U071 COVID-19: Secondary | ICD-10-CM | POA: Diagnosis not present

## 2020-05-28 DIAGNOSIS — Z20828 Contact with and (suspected) exposure to other viral communicable diseases: Secondary | ICD-10-CM | POA: Diagnosis not present

## 2020-05-28 DIAGNOSIS — U071 COVID-19: Secondary | ICD-10-CM | POA: Diagnosis not present

## 2020-05-30 DIAGNOSIS — U071 COVID-19: Secondary | ICD-10-CM | POA: Diagnosis not present

## 2020-05-30 DIAGNOSIS — Z20828 Contact with and (suspected) exposure to other viral communicable diseases: Secondary | ICD-10-CM | POA: Diagnosis not present

## 2020-06-04 DIAGNOSIS — Z20828 Contact with and (suspected) exposure to other viral communicable diseases: Secondary | ICD-10-CM | POA: Diagnosis not present

## 2020-06-04 DIAGNOSIS — U071 COVID-19: Secondary | ICD-10-CM | POA: Diagnosis not present

## 2020-06-04 DIAGNOSIS — F0150 Vascular dementia without behavioral disturbance: Secondary | ICD-10-CM | POA: Diagnosis not present

## 2020-06-04 DIAGNOSIS — R7309 Other abnormal glucose: Secondary | ICD-10-CM | POA: Diagnosis not present

## 2020-06-04 DIAGNOSIS — I2510 Atherosclerotic heart disease of native coronary artery without angina pectoris: Secondary | ICD-10-CM | POA: Diagnosis not present

## 2020-06-04 DIAGNOSIS — E1122 Type 2 diabetes mellitus with diabetic chronic kidney disease: Secondary | ICD-10-CM | POA: Diagnosis not present

## 2020-06-04 DIAGNOSIS — E785 Hyperlipidemia, unspecified: Secondary | ICD-10-CM | POA: Diagnosis not present

## 2020-06-06 DIAGNOSIS — Z20828 Contact with and (suspected) exposure to other viral communicable diseases: Secondary | ICD-10-CM | POA: Diagnosis not present

## 2020-06-06 DIAGNOSIS — U071 COVID-19: Secondary | ICD-10-CM | POA: Diagnosis not present

## 2020-06-11 DIAGNOSIS — U071 COVID-19: Secondary | ICD-10-CM | POA: Diagnosis not present

## 2020-06-11 DIAGNOSIS — Z20828 Contact with and (suspected) exposure to other viral communicable diseases: Secondary | ICD-10-CM | POA: Diagnosis not present

## 2020-06-13 DIAGNOSIS — U071 COVID-19: Secondary | ICD-10-CM | POA: Diagnosis not present

## 2020-06-13 DIAGNOSIS — Z20828 Contact with and (suspected) exposure to other viral communicable diseases: Secondary | ICD-10-CM | POA: Diagnosis not present

## 2020-06-18 DIAGNOSIS — Z20828 Contact with and (suspected) exposure to other viral communicable diseases: Secondary | ICD-10-CM | POA: Diagnosis not present

## 2020-06-18 DIAGNOSIS — U071 COVID-19: Secondary | ICD-10-CM | POA: Diagnosis not present

## 2020-06-20 DIAGNOSIS — Z20828 Contact with and (suspected) exposure to other viral communicable diseases: Secondary | ICD-10-CM | POA: Diagnosis not present

## 2020-06-20 DIAGNOSIS — U071 COVID-19: Secondary | ICD-10-CM | POA: Diagnosis not present

## 2020-06-25 DIAGNOSIS — U071 COVID-19: Secondary | ICD-10-CM | POA: Diagnosis not present

## 2020-06-25 DIAGNOSIS — Z20828 Contact with and (suspected) exposure to other viral communicable diseases: Secondary | ICD-10-CM | POA: Diagnosis not present

## 2020-06-27 DIAGNOSIS — U071 COVID-19: Secondary | ICD-10-CM | POA: Diagnosis not present

## 2020-06-27 DIAGNOSIS — Z20828 Contact with and (suspected) exposure to other viral communicable diseases: Secondary | ICD-10-CM | POA: Diagnosis not present

## 2020-07-01 ENCOUNTER — Other Ambulatory Visit: Payer: Self-pay | Admitting: Cardiology

## 2020-07-02 DIAGNOSIS — Z20828 Contact with and (suspected) exposure to other viral communicable diseases: Secondary | ICD-10-CM | POA: Diagnosis not present

## 2020-07-02 DIAGNOSIS — U071 COVID-19: Secondary | ICD-10-CM | POA: Diagnosis not present

## 2020-07-04 DIAGNOSIS — U071 COVID-19: Secondary | ICD-10-CM | POA: Diagnosis not present

## 2020-07-04 DIAGNOSIS — Z20828 Contact with and (suspected) exposure to other viral communicable diseases: Secondary | ICD-10-CM | POA: Diagnosis not present

## 2020-07-05 DIAGNOSIS — U071 COVID-19: Secondary | ICD-10-CM | POA: Diagnosis not present

## 2020-07-05 DIAGNOSIS — Z20828 Contact with and (suspected) exposure to other viral communicable diseases: Secondary | ICD-10-CM | POA: Diagnosis not present

## 2020-07-09 DIAGNOSIS — U071 COVID-19: Secondary | ICD-10-CM | POA: Diagnosis not present

## 2020-07-09 DIAGNOSIS — Z20828 Contact with and (suspected) exposure to other viral communicable diseases: Secondary | ICD-10-CM | POA: Diagnosis not present

## 2020-07-10 DIAGNOSIS — H353131 Nonexudative age-related macular degeneration, bilateral, early dry stage: Secondary | ICD-10-CM | POA: Diagnosis not present

## 2020-07-11 DIAGNOSIS — Z20828 Contact with and (suspected) exposure to other viral communicable diseases: Secondary | ICD-10-CM | POA: Diagnosis not present

## 2020-07-11 DIAGNOSIS — U071 COVID-19: Secondary | ICD-10-CM | POA: Diagnosis not present

## 2020-07-16 DIAGNOSIS — Z20828 Contact with and (suspected) exposure to other viral communicable diseases: Secondary | ICD-10-CM | POA: Diagnosis not present

## 2020-07-16 DIAGNOSIS — U071 COVID-19: Secondary | ICD-10-CM | POA: Diagnosis not present

## 2020-07-18 DIAGNOSIS — Z20828 Contact with and (suspected) exposure to other viral communicable diseases: Secondary | ICD-10-CM | POA: Diagnosis not present

## 2020-07-18 DIAGNOSIS — U071 COVID-19: Secondary | ICD-10-CM | POA: Diagnosis not present

## 2020-07-23 DIAGNOSIS — U071 COVID-19: Secondary | ICD-10-CM | POA: Diagnosis not present

## 2020-07-23 DIAGNOSIS — Z20828 Contact with and (suspected) exposure to other viral communicable diseases: Secondary | ICD-10-CM | POA: Diagnosis not present

## 2020-07-25 DIAGNOSIS — Z20828 Contact with and (suspected) exposure to other viral communicable diseases: Secondary | ICD-10-CM | POA: Diagnosis not present

## 2020-07-25 DIAGNOSIS — U071 COVID-19: Secondary | ICD-10-CM | POA: Diagnosis not present

## 2020-07-30 DIAGNOSIS — Z20828 Contact with and (suspected) exposure to other viral communicable diseases: Secondary | ICD-10-CM | POA: Diagnosis not present

## 2020-07-30 DIAGNOSIS — U071 COVID-19: Secondary | ICD-10-CM | POA: Diagnosis not present

## 2020-08-01 DIAGNOSIS — Z20828 Contact with and (suspected) exposure to other viral communicable diseases: Secondary | ICD-10-CM | POA: Diagnosis not present

## 2020-08-01 DIAGNOSIS — U071 COVID-19: Secondary | ICD-10-CM | POA: Diagnosis not present

## 2020-08-06 DIAGNOSIS — Z20828 Contact with and (suspected) exposure to other viral communicable diseases: Secondary | ICD-10-CM | POA: Diagnosis not present

## 2020-08-06 DIAGNOSIS — U071 COVID-19: Secondary | ICD-10-CM | POA: Diagnosis not present

## 2020-08-08 DIAGNOSIS — U071 COVID-19: Secondary | ICD-10-CM | POA: Diagnosis not present

## 2020-08-08 DIAGNOSIS — Z20828 Contact with and (suspected) exposure to other viral communicable diseases: Secondary | ICD-10-CM | POA: Diagnosis not present

## 2020-08-13 DIAGNOSIS — Z20828 Contact with and (suspected) exposure to other viral communicable diseases: Secondary | ICD-10-CM | POA: Diagnosis not present

## 2020-08-13 DIAGNOSIS — U071 COVID-19: Secondary | ICD-10-CM | POA: Diagnosis not present

## 2020-08-15 DIAGNOSIS — U071 COVID-19: Secondary | ICD-10-CM | POA: Diagnosis not present

## 2020-08-15 DIAGNOSIS — Z20828 Contact with and (suspected) exposure to other viral communicable diseases: Secondary | ICD-10-CM | POA: Diagnosis not present

## 2020-08-20 DIAGNOSIS — U071 COVID-19: Secondary | ICD-10-CM | POA: Diagnosis not present

## 2020-08-20 DIAGNOSIS — Z20828 Contact with and (suspected) exposure to other viral communicable diseases: Secondary | ICD-10-CM | POA: Diagnosis not present

## 2020-08-26 DIAGNOSIS — Z79899 Other long term (current) drug therapy: Secondary | ICD-10-CM | POA: Diagnosis not present

## 2020-08-26 DIAGNOSIS — I2510 Atherosclerotic heart disease of native coronary artery without angina pectoris: Secondary | ICD-10-CM | POA: Diagnosis not present

## 2020-08-26 DIAGNOSIS — F0150 Vascular dementia without behavioral disturbance: Secondary | ICD-10-CM | POA: Diagnosis not present

## 2020-08-26 DIAGNOSIS — E1129 Type 2 diabetes mellitus with other diabetic kidney complication: Secondary | ICD-10-CM | POA: Diagnosis not present

## 2020-08-26 DIAGNOSIS — E785 Hyperlipidemia, unspecified: Secondary | ICD-10-CM | POA: Diagnosis not present

## 2020-08-27 DIAGNOSIS — Z20828 Contact with and (suspected) exposure to other viral communicable diseases: Secondary | ICD-10-CM | POA: Diagnosis not present

## 2020-08-27 DIAGNOSIS — U071 COVID-19: Secondary | ICD-10-CM | POA: Diagnosis not present

## 2020-09-02 DIAGNOSIS — I2510 Atherosclerotic heart disease of native coronary artery without angina pectoris: Secondary | ICD-10-CM | POA: Diagnosis not present

## 2020-09-02 DIAGNOSIS — F0150 Vascular dementia without behavioral disturbance: Secondary | ICD-10-CM | POA: Diagnosis not present

## 2020-09-02 DIAGNOSIS — E1122 Type 2 diabetes mellitus with diabetic chronic kidney disease: Secondary | ICD-10-CM | POA: Diagnosis not present

## 2020-09-03 DIAGNOSIS — Z20828 Contact with and (suspected) exposure to other viral communicable diseases: Secondary | ICD-10-CM | POA: Diagnosis not present

## 2020-09-03 DIAGNOSIS — U071 COVID-19: Secondary | ICD-10-CM | POA: Diagnosis not present

## 2020-09-12 DIAGNOSIS — Z20828 Contact with and (suspected) exposure to other viral communicable diseases: Secondary | ICD-10-CM | POA: Diagnosis not present

## 2020-09-12 DIAGNOSIS — U071 COVID-19: Secondary | ICD-10-CM | POA: Diagnosis not present

## 2020-09-17 DIAGNOSIS — Z20828 Contact with and (suspected) exposure to other viral communicable diseases: Secondary | ICD-10-CM | POA: Diagnosis not present

## 2020-09-17 DIAGNOSIS — U071 COVID-19: Secondary | ICD-10-CM | POA: Diagnosis not present

## 2020-09-20 ENCOUNTER — Other Ambulatory Visit: Payer: Self-pay | Admitting: Cardiology

## 2020-09-24 DIAGNOSIS — U071 COVID-19: Secondary | ICD-10-CM | POA: Diagnosis not present

## 2020-09-24 DIAGNOSIS — Z20828 Contact with and (suspected) exposure to other viral communicable diseases: Secondary | ICD-10-CM | POA: Diagnosis not present

## 2020-10-01 DIAGNOSIS — Z20828 Contact with and (suspected) exposure to other viral communicable diseases: Secondary | ICD-10-CM | POA: Diagnosis not present

## 2020-10-01 DIAGNOSIS — U071 COVID-19: Secondary | ICD-10-CM | POA: Diagnosis not present

## 2020-10-08 DIAGNOSIS — U071 COVID-19: Secondary | ICD-10-CM | POA: Diagnosis not present

## 2020-10-08 DIAGNOSIS — Z20828 Contact with and (suspected) exposure to other viral communicable diseases: Secondary | ICD-10-CM | POA: Diagnosis not present

## 2020-10-15 DIAGNOSIS — Z20828 Contact with and (suspected) exposure to other viral communicable diseases: Secondary | ICD-10-CM | POA: Diagnosis not present

## 2020-10-15 DIAGNOSIS — U071 COVID-19: Secondary | ICD-10-CM | POA: Diagnosis not present

## 2020-10-22 DIAGNOSIS — U071 COVID-19: Secondary | ICD-10-CM | POA: Diagnosis not present

## 2020-10-22 DIAGNOSIS — Z20828 Contact with and (suspected) exposure to other viral communicable diseases: Secondary | ICD-10-CM | POA: Diagnosis not present

## 2020-10-29 DIAGNOSIS — Z20828 Contact with and (suspected) exposure to other viral communicable diseases: Secondary | ICD-10-CM | POA: Diagnosis not present

## 2020-10-29 DIAGNOSIS — U071 COVID-19: Secondary | ICD-10-CM | POA: Diagnosis not present

## 2020-11-05 DIAGNOSIS — U071 COVID-19: Secondary | ICD-10-CM | POA: Diagnosis not present

## 2020-11-05 DIAGNOSIS — Z20828 Contact with and (suspected) exposure to other viral communicable diseases: Secondary | ICD-10-CM | POA: Diagnosis not present

## 2020-11-13 DIAGNOSIS — U071 COVID-19: Secondary | ICD-10-CM | POA: Diagnosis not present

## 2020-11-13 DIAGNOSIS — Z20828 Contact with and (suspected) exposure to other viral communicable diseases: Secondary | ICD-10-CM | POA: Diagnosis not present

## 2020-11-19 DIAGNOSIS — Z20828 Contact with and (suspected) exposure to other viral communicable diseases: Secondary | ICD-10-CM | POA: Diagnosis not present

## 2020-11-19 DIAGNOSIS — U071 COVID-19: Secondary | ICD-10-CM | POA: Diagnosis not present

## 2020-11-26 DIAGNOSIS — U071 COVID-19: Secondary | ICD-10-CM | POA: Diagnosis not present

## 2020-11-26 DIAGNOSIS — Z20828 Contact with and (suspected) exposure to other viral communicable diseases: Secondary | ICD-10-CM | POA: Diagnosis not present

## 2020-12-03 DIAGNOSIS — U071 COVID-19: Secondary | ICD-10-CM | POA: Diagnosis not present

## 2020-12-03 DIAGNOSIS — Z20828 Contact with and (suspected) exposure to other viral communicable diseases: Secondary | ICD-10-CM | POA: Diagnosis not present

## 2020-12-10 DIAGNOSIS — Z20828 Contact with and (suspected) exposure to other viral communicable diseases: Secondary | ICD-10-CM | POA: Diagnosis not present

## 2020-12-10 DIAGNOSIS — U071 COVID-19: Secondary | ICD-10-CM | POA: Diagnosis not present

## 2020-12-17 DIAGNOSIS — U071 COVID-19: Secondary | ICD-10-CM | POA: Diagnosis not present

## 2020-12-17 DIAGNOSIS — Z20828 Contact with and (suspected) exposure to other viral communicable diseases: Secondary | ICD-10-CM | POA: Diagnosis not present

## 2020-12-24 DIAGNOSIS — E1129 Type 2 diabetes mellitus with other diabetic kidney complication: Secondary | ICD-10-CM | POA: Diagnosis not present

## 2020-12-24 DIAGNOSIS — Z20828 Contact with and (suspected) exposure to other viral communicable diseases: Secondary | ICD-10-CM | POA: Diagnosis not present

## 2020-12-24 DIAGNOSIS — U071 COVID-19: Secondary | ICD-10-CM | POA: Diagnosis not present

## 2020-12-31 DIAGNOSIS — Z20828 Contact with and (suspected) exposure to other viral communicable diseases: Secondary | ICD-10-CM | POA: Diagnosis not present

## 2020-12-31 DIAGNOSIS — R7309 Other abnormal glucose: Secondary | ICD-10-CM | POA: Diagnosis not present

## 2020-12-31 DIAGNOSIS — E1122 Type 2 diabetes mellitus with diabetic chronic kidney disease: Secondary | ICD-10-CM | POA: Diagnosis not present

## 2020-12-31 DIAGNOSIS — I2510 Atherosclerotic heart disease of native coronary artery without angina pectoris: Secondary | ICD-10-CM | POA: Diagnosis not present

## 2020-12-31 DIAGNOSIS — U071 COVID-19: Secondary | ICD-10-CM | POA: Diagnosis not present

## 2020-12-31 DIAGNOSIS — F0150 Vascular dementia without behavioral disturbance: Secondary | ICD-10-CM | POA: Diagnosis not present

## 2021-01-01 DIAGNOSIS — H353131 Nonexudative age-related macular degeneration, bilateral, early dry stage: Secondary | ICD-10-CM | POA: Diagnosis not present

## 2021-01-07 DIAGNOSIS — I69318 Other symptoms and signs involving cognitive functions following cerebral infarction: Secondary | ICD-10-CM | POA: Diagnosis not present

## 2021-01-07 DIAGNOSIS — F0150 Vascular dementia without behavioral disturbance: Secondary | ICD-10-CM | POA: Diagnosis not present

## 2021-01-07 DIAGNOSIS — M6281 Muscle weakness (generalized): Secondary | ICD-10-CM | POA: Diagnosis not present

## 2021-01-07 DIAGNOSIS — I700 Atherosclerosis of aorta: Secondary | ICD-10-CM | POA: Diagnosis not present

## 2021-01-07 DIAGNOSIS — Z7984 Long term (current) use of oral hypoglycemic drugs: Secondary | ICD-10-CM | POA: Diagnosis not present

## 2021-01-07 DIAGNOSIS — E1129 Type 2 diabetes mellitus with other diabetic kidney complication: Secondary | ICD-10-CM | POA: Diagnosis not present

## 2021-01-07 DIAGNOSIS — Z20828 Contact with and (suspected) exposure to other viral communicable diseases: Secondary | ICD-10-CM | POA: Diagnosis not present

## 2021-01-07 DIAGNOSIS — I2510 Atherosclerotic heart disease of native coronary artery without angina pectoris: Secondary | ICD-10-CM | POA: Diagnosis not present

## 2021-01-07 DIAGNOSIS — U071 COVID-19: Secondary | ICD-10-CM | POA: Diagnosis not present

## 2021-01-07 DIAGNOSIS — Z794 Long term (current) use of insulin: Secondary | ICD-10-CM | POA: Diagnosis not present

## 2021-01-07 DIAGNOSIS — D510 Vitamin B12 deficiency anemia due to intrinsic factor deficiency: Secondary | ICD-10-CM | POA: Diagnosis not present

## 2021-01-13 DIAGNOSIS — Z7984 Long term (current) use of oral hypoglycemic drugs: Secondary | ICD-10-CM | POA: Diagnosis not present

## 2021-01-13 DIAGNOSIS — D510 Vitamin B12 deficiency anemia due to intrinsic factor deficiency: Secondary | ICD-10-CM | POA: Diagnosis not present

## 2021-01-13 DIAGNOSIS — I2510 Atherosclerotic heart disease of native coronary artery without angina pectoris: Secondary | ICD-10-CM | POA: Diagnosis not present

## 2021-01-13 DIAGNOSIS — E1129 Type 2 diabetes mellitus with other diabetic kidney complication: Secondary | ICD-10-CM | POA: Diagnosis not present

## 2021-01-13 DIAGNOSIS — Z794 Long term (current) use of insulin: Secondary | ICD-10-CM | POA: Diagnosis not present

## 2021-01-13 DIAGNOSIS — I69318 Other symptoms and signs involving cognitive functions following cerebral infarction: Secondary | ICD-10-CM | POA: Diagnosis not present

## 2021-01-13 DIAGNOSIS — F0150 Vascular dementia without behavioral disturbance: Secondary | ICD-10-CM | POA: Diagnosis not present

## 2021-01-13 DIAGNOSIS — M6281 Muscle weakness (generalized): Secondary | ICD-10-CM | POA: Diagnosis not present

## 2021-01-13 DIAGNOSIS — I700 Atherosclerosis of aorta: Secondary | ICD-10-CM | POA: Diagnosis not present

## 2021-01-14 DIAGNOSIS — U071 COVID-19: Secondary | ICD-10-CM | POA: Diagnosis not present

## 2021-01-14 DIAGNOSIS — Z20828 Contact with and (suspected) exposure to other viral communicable diseases: Secondary | ICD-10-CM | POA: Diagnosis not present

## 2021-01-21 DIAGNOSIS — Z20828 Contact with and (suspected) exposure to other viral communicable diseases: Secondary | ICD-10-CM | POA: Diagnosis not present

## 2021-01-21 DIAGNOSIS — U071 COVID-19: Secondary | ICD-10-CM | POA: Diagnosis not present

## 2021-01-23 DIAGNOSIS — C44622 Squamous cell carcinoma of skin of right upper limb, including shoulder: Secondary | ICD-10-CM | POA: Diagnosis not present

## 2021-01-28 DIAGNOSIS — U071 COVID-19: Secondary | ICD-10-CM | POA: Diagnosis not present

## 2021-01-28 DIAGNOSIS — Z20828 Contact with and (suspected) exposure to other viral communicable diseases: Secondary | ICD-10-CM | POA: Diagnosis not present

## 2021-02-05 ENCOUNTER — Other Ambulatory Visit: Payer: Self-pay

## 2021-02-05 ENCOUNTER — Emergency Department (HOSPITAL_COMMUNITY)
Admission: EM | Admit: 2021-02-05 | Discharge: 2021-02-05 | Disposition: A | Discharge status: Home / Self Care | Payer: PPO | Attending: Emergency Medicine | Admitting: Emergency Medicine

## 2021-02-05 ENCOUNTER — Emergency Department (HOSPITAL_COMMUNITY): Payer: PPO

## 2021-02-05 ENCOUNTER — Other Ambulatory Visit (HOSPITAL_COMMUNITY): Payer: Self-pay

## 2021-02-05 ENCOUNTER — Encounter (HOSPITAL_COMMUNITY): Payer: Self-pay | Admitting: Emergency Medicine

## 2021-02-05 DIAGNOSIS — F0391 Unspecified dementia with behavioral disturbance: Secondary | ICD-10-CM | POA: Insufficient documentation

## 2021-02-05 DIAGNOSIS — U071 COVID-19: Secondary | ICD-10-CM | POA: Diagnosis not present

## 2021-02-05 DIAGNOSIS — R531 Weakness: Secondary | ICD-10-CM

## 2021-02-05 DIAGNOSIS — R000 Tachycardia, unspecified: Secondary | ICD-10-CM | POA: Diagnosis not present

## 2021-02-05 DIAGNOSIS — Z87891 Personal history of nicotine dependence: Secondary | ICD-10-CM | POA: Insufficient documentation

## 2021-02-05 DIAGNOSIS — Z7982 Long term (current) use of aspirin: Secondary | ICD-10-CM | POA: Diagnosis not present

## 2021-02-05 DIAGNOSIS — Z794 Long term (current) use of insulin: Secondary | ICD-10-CM | POA: Diagnosis not present

## 2021-02-05 DIAGNOSIS — R059 Cough, unspecified: Secondary | ICD-10-CM | POA: Diagnosis not present

## 2021-02-05 DIAGNOSIS — I129 Hypertensive chronic kidney disease with stage 1 through stage 4 chronic kidney disease, or unspecified chronic kidney disease: Secondary | ICD-10-CM | POA: Insufficient documentation

## 2021-02-05 DIAGNOSIS — N183 Chronic kidney disease, stage 3 unspecified: Secondary | ICD-10-CM | POA: Diagnosis not present

## 2021-02-05 DIAGNOSIS — I2510 Atherosclerotic heart disease of native coronary artery without angina pectoris: Secondary | ICD-10-CM | POA: Insufficient documentation

## 2021-02-05 DIAGNOSIS — E1122 Type 2 diabetes mellitus with diabetic chronic kidney disease: Secondary | ICD-10-CM | POA: Diagnosis not present

## 2021-02-05 DIAGNOSIS — Z79899 Other long term (current) drug therapy: Secondary | ICD-10-CM | POA: Diagnosis not present

## 2021-02-05 DIAGNOSIS — R69 Illness, unspecified: Secondary | ICD-10-CM | POA: Diagnosis not present

## 2021-02-05 DIAGNOSIS — Z20828 Contact with and (suspected) exposure to other viral communicable diseases: Secondary | ICD-10-CM | POA: Diagnosis not present

## 2021-02-05 DIAGNOSIS — M6281 Muscle weakness (generalized): Secondary | ICD-10-CM | POA: Diagnosis not present

## 2021-02-05 LAB — URINALYSIS, ROUTINE W REFLEX MICROSCOPIC
Bacteria, UA: NONE SEEN
Bilirubin Urine: NEGATIVE
Glucose, UA: NEGATIVE mg/dL
Ketones, ur: 20 mg/dL — AB
Nitrite: NEGATIVE
Protein, ur: NEGATIVE mg/dL
Specific Gravity, Urine: 1.021 (ref 1.005–1.030)
pH: 5 (ref 5.0–8.0)

## 2021-02-05 LAB — BASIC METABOLIC PANEL
Anion gap: 12 (ref 5–15)
BUN: 19 mg/dL (ref 8–23)
CO2: 24 mmol/L (ref 22–32)
Calcium: 9.2 mg/dL (ref 8.9–10.3)
Chloride: 103 mmol/L (ref 98–111)
Creatinine, Ser: 1.14 mg/dL (ref 0.61–1.24)
GFR, Estimated: >60 mL/min (ref >60)
Glucose, Bld: 156 mg/dL — ABNORMAL HIGH (ref 70–99)
Potassium: 4.1 mmol/L (ref 3.5–5.1)
Sodium: 139 mmol/L (ref 135–145)

## 2021-02-05 LAB — CBC
HCT: 43.7 % (ref 39.0–52.0)
Hemoglobin: 14.5 g/dL (ref 13.0–17.0)
MCH: 32.1 pg (ref 26.0–34.0)
MCHC: 33.2 g/dL (ref 30.0–36.0)
MCV: 96.7 fL (ref 80.0–100.0)
Platelets: 206 10*3/uL (ref 150–400)
RBC: 4.52 MIL/uL (ref 4.22–5.81)
RDW: 12.6 % (ref 11.5–15.5)
WBC: 15.5 10*3/uL — ABNORMAL HIGH (ref 4.0–10.5)
nRBC: 0 % (ref 0.0–0.2)

## 2021-02-05 LAB — HEPATIC FUNCTION PANEL
ALT: 29 U/L (ref 0–44)
AST: 24 U/L (ref 15–41)
Albumin: 3.9 g/dL (ref 3.5–5.0)
Alkaline Phosphatase: 59 U/L (ref 38–126)
Bilirubin, Direct: 0.3 mg/dL — ABNORMAL HIGH (ref 0.0–0.2)
Indirect Bilirubin: 1.2 mg/dL — ABNORMAL HIGH (ref 0.3–0.9)
Total Bilirubin: 1.5 mg/dL — ABNORMAL HIGH (ref 0.3–1.2)
Total Protein: 6.9 g/dL (ref 6.5–8.1)

## 2021-02-05 LAB — RESP PANEL BY RT-PCR (FLU A&B, COVID) ARPGX2
Influenza A by PCR: NEGATIVE
Influenza B by PCR: NEGATIVE
SARS Coronavirus 2 by RT PCR: POSITIVE — AB

## 2021-02-05 MED ORDER — BENZONATATE 100 MG PO CAPS
100.0000 mg | ORAL_CAPSULE | Freq: Three times a day (TID) | ORAL | 0 refills | Status: DC
  Filled 2021-02-05: qty 21, 7d supply, fill #0

## 2021-02-05 MED ORDER — MOLNUPIRAVIR EUA 200MG CAPSULE: 4.0000 | ORAL_CAPSULE | Freq: Two times a day (BID) | ORAL | 0 refills | Status: AC

## 2021-02-05 NOTE — ED Provider Notes (Signed)
Patient seen in conjunction with [facility] PA-C. See her note for full HPI, exam and MDM  In summation 85 yo hx of dementia here with generalized weakness and cough. Per consult with nursing facility concerned about UTI (has freq) or COVID) He is at baseline mentation. Has dementia. Plan on labs and imaging.  Pleasantly confused however follows commands Heart: clear 2+ Radial, DP pulses BL Lungs: course, non respiratory distress Abd: Soft non tender MSK: moves all 4 extremities Neuro:Equal grip bilaterally. CN 2-12 intact Psy: cooperative  Labs and imaging personally reviewed and interpreted COVID-positive.   UA negative for infection  Generalized weakness likely due to COVID infection.  DC home with p.o. antiviral per [facility], Eiko Mcgowen A, PA-C 02/05/21 1320    Milton Ferguson, MD 02/07/21 0830

## 2021-02-05 NOTE — ED Notes (Addendum)
Date and time results received: 02/05/21 & 1025 hr   Test: COVID Critical Value: POSITIVE  Name of Provider Notified: EDP  Orders Received? Or Actions Taken?: Notified

## 2021-02-05 NOTE — ED Notes (Signed)
Jacob Little [facility]) stated someone will be here within the hour to pick him up.

## 2021-02-05 NOTE — ED Provider Notes (Signed)
[facility] EMERGENCY DEPARTMENT Provider Note   CSN: [id number] Arrival date & time: 02/05/21  0820     History Chief Complaint  Patient presents with   Weakness    Jacob Little is a 85 y.o. male.  The patient is an 85 y.o. male with past medical history of dementia, stroke, and CAD who presents today from nursing facility with chief complaint of weakness. According to patient, he woke up this morning and 'just didn't feel right.' Otherwise, patient has no concerns or complaints. He is alert and oriented to time and place which is baseline for patient. After speaking with nursing facility, nurse manager states that she found the patient to be pale, cool, clammy, and hypotensive in 123XX123 systolic. Patient also has cough noted to be new as of this morning. Nurse manager concerned for COVID and UTI given COVID exposure and frequent UTIs in the past.   Level 5 caveat-dementia  The history is provided by the patient and the nursing home. No language interpreter was used.  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) 11/08/2017   Essential hypertension 11/08/2017   Diabetes mellitus with hyperglycemia, without long-term current use of insulin ([place]) 11/08/2017   Acute ischemic left MCA stroke ([place]) 11/08/2017   DENTAL PAIN 05/12/2010   CORONARY ATHEROSCLEROSIS NATIVE CORONARY ARTERY 07/22/2009   DM 07/12/2009   HLD (hyperlipidemia) 07/12/2009   HYPERTENSION 07/12/2009   ANGINA, ATYPICAL 07/12/2009    Past Surgical History:  Procedure Laterality Date   APPENDECTOMY     CORONARY ANGIOPLASTY WITH STENT PLACEMENT         History reviewed. No pertinent family history.  Social History   Tobacco Use   Smoking status: Former    Types: Cigarettes    Quit date: 06/15/2005    Years since quitting: 15.6   Smokeless tobacco: Never  Vaping Use   Vaping Use: Never used  Substance Use Topics   Alcohol use: No    Alcohol/week: 0.0 standard drinks   Drug use: No    Home Medications Prior to Admission medications   Medication Sig Start Date End Date Taking? Authorizing Provider  acetaminophen (TYLENOL) 325 MG tablet Take 2 tablets (650 mg total) by mouth every 4 (four) hours as needed for mild pain (or temp > 37.5 C (99.5 F)). 11/12/17  Yes Barton Dubois, MD  albuterol (  PROVENTIL HFA;VENTOLIN HFA) 108 (90 Base) MCG/ACT inhaler Inhale 2 puffs into the lungs every 6 (six) hours as needed for wheezing or shortness of breath. 03/16/18  Yes Memon, Jolaine Artist, MD  ASPIRIN LOW DOSE 81 MG EC tablet TAKE (1) TABLET BY MOUTH ONCE DAILY. Patient taking differently: Take 81 mg by mouth daily. 09/20/20  Yes Satira Sark, MD  atorvastatin (LIPITOR) 20 MG tablet Take 20 mg by mouth daily.   Yes [provider]  benzonatate (TESSALON) 100 MG capsule Take 1 capsule (100 mg total) by mouth every 8 (eight) hours. 02/05/21  Yes Jedaiah Rathbun A, PA   cyanocobalamin (,VITAMIN B-12,) 1000 MCG/ML injection Inject 1,000 mcg into the muscle every 30 (thirty) days.  09/14/17  Yes [provider]  Dextromethorphan-guaiFENesin (TUSSIN DM PO) Take 5 mLs by mouth every 6 (six) hours as needed (cough/congestion).   Yes [provider]  insulin glargine (LANTUS) 100 UNIT/ML injection Inject 22 Units into the skin daily. Hold if BS<60. Notify MD if BS>400   Yes [provider]  isosorbide mononitrate (IMDUR) 30 MG 24 hr tablet Take 0.5 tablets (15 mg total) by mouth 2 (two) times daily. 01/07/18 02/05/21 Yes Barton Dubois, MD  JANUVIA 100 MG tablet Take 1 tablet by mouth daily. 11/04/18  Yes [provider]  metoprolol tartrate (LOPRESSOR) 25 MG tablet Take 0.5 tablets (12.5 mg total) by mouth 2 (two) times daily. 11/12/17  Yes Barton Dubois, MD  molnupiravir EUA 200 mg CAPS Take 4 capsules (800 mg total) by mouth 2 (two) times daily for 5 days. 02/05/21 02/10/21 Yes Lambert Jeanty, Leary Roca, PA  nitroGLYCERIN (NITROSTAT) 0.4 MG SL tablet Place 0.4 mg under the tongue every 5 (five) minutes as needed for chest pain.   Yes [provider]  risperiDONE (RISPERDAL) 0.25 MG tablet Take 1 tablet by mouth daily. 11/04/18  Yes [provider]  glimepiride (AMARYL) 1 MG tablet Take 1 tablet (1 mg total) by mouth every morning. 01/07/18 10/04/19  Barton Dubois, MD  ondansetron (ZOFRAN ODT) 8 MG disintegrating tablet Take 1 tablet (8 mg total) by mouth every 8 (eight) hours as needed for nausea. Patient not taking: No sig reported 02/24/19   Varney Biles, MD    Allergies    Patient has no known allergies.  Review of Systems   Review of Systems  Unable to perform ROS: Dementia  Constitutional:  Negative for fever.  Cardiovascular: Negative.   Gastrointestinal: Negative.   Neurological:  Positive for weakness.  All other systems reviewed and are negative.  Physical Exam Updated Vital Signs BP (!) 155/62   Pulse 74    Temp 98.9 F (37.2 C) (Oral)   Resp 16   Ht '5\' 8"'$  (1.727 m)   Wt 66.7 kg   SpO2 96%   BMI 22.35 kg/m   Physical Exam Vitals and nursing note reviewed.  Constitutional:      General: He is not in acute distress.    Appearance: Normal appearance. He is not toxic-appearing.  HENT:     Head: Atraumatic.     Nose: Nose normal.  Cardiovascular:     Rate and Rhythm: Normal rate.  Pulmonary:     Effort: No respiratory distress.  Abdominal:     General: There is no distension.  Musculoskeletal:     Cervical back: Normal range of motion.     Right lower leg: No edema.     Left lower leg: No edema.  Skin:    General:  Skin is warm.  Neurological:     General: No focal deficit present.     Mental Status: He is alert. Mental status is at baseline.     Cranial Nerves: Cranial nerves are intact.     Motor: Motor function is intact.  Psychiatric:        Mood and Affect: Mood normal.    ED Results / Procedures / Treatments   Labs (all labs ordered are listed, but only abnormal results are displayed) Labs Reviewed  RESP PANEL BY RT-PCR (FLU A&B, COVID) ARPGX2 - Abnormal; Notable for the following components:      Result Value   SARS Coronavirus 2 by RT PCR POSITIVE (*)    All other components within normal limits  BASIC METABOLIC PANEL - Abnormal; Notable for the following components:   Glucose, Bld 156 (*)    All other components within normal limits  CBC - Abnormal; Notable for the following components:   WBC 15.5 (*)    All other components within normal limits  URINALYSIS, ROUTINE W REFLEX MICROSCOPIC - Abnormal; Notable for the following components:   APPearance HAZY (*)    Hgb urine dipstick SMALL (*)    Ketones, ur 20 (*)    Leukocytes,Ua TRACE (*)    All other components within normal limits  HEPATIC FUNCTION PANEL - Abnormal; Notable for the following components:   Total Bilirubin 1.5 (*)    Bilirubin, Direct 0.3 (*)    Indirect Bilirubin 1.2 (*)    All other  components within normal limits    EKG None  Radiology CT HEAD WO CONTRAST (5MM)  Result Date: 02/05/2021 CLINICAL DATA:  85 year old male with history of dementia. Generalized weakness. EXAM: CT HEAD WITHOUT CONTRAST TECHNIQUE: Contiguous axial images were obtained from the base of the skull through the vertex without intravenous contrast. COMPARISON:  Head CT 10/04/2019. FINDINGS: Brain: Severe cerebral and mild cerebellar atrophy. Patchy and confluent areas of decreased attenuation are noted throughout the deep and periventricular white matter of the cerebral hemispheres bilaterally, compatible with chronic microvascular ischemic disease. No evidence of acute infarction, hemorrhage, hydrocephalus, extra-axial collection or mass lesion/mass effect. Vascular: No hyperdense vessel or unexpected calcification. Skull: Normal. Negative for fracture or focal lesion. Sinuses/Orbits: No acute finding. Other: None. IMPRESSION: 1. No acute intracranial abnormalities. 2. Severe cerebral and mild cerebellar atrophy with severe chronic microvascular ischemic changes in the cerebral white matter, similar to the prior study, as above. Electronically Signed   By: Vinnie Langton M.D.   On: 02/05/2021 10:33   DG Chest Portable 1 View  Result Date: 02/05/2021 CLINICAL DATA:  Cough.  Generalized weakness.  Former smoker. EXAM: PORTABLE CHEST 1 VIEW COMPARISON:  Chest radiograph 10/04/2019 FINDINGS: The heart size and mediastinal contours are within normal limits. Aortic calcifications. Both lungs are clear. No pleural effusion or pneumothorax. Healed right posterolateral fifth and sixth rib fractures. IMPRESSION: No active cardiopulmonary disease. Electronically Signed   By: Ileana Roup M.D.   On: 02/05/2021 10:17    Procedures Procedures   Medications Ordered in ED Medications - No data to display  ED Course  I have reviewed the triage vital signs and the nursing notes.  Pertinent labs & imaging results  that were available during my care of the patient were reviewed by me and considered in my medical decision making (see chart for details).  Patient here for evaluation of generalized weakness. Patient is afebrile, aseptic, and non-ill appearing. His heart and lungs are clear.  His abdomen is soft, nontender.  He has a nonfocal neuro exam. Low suspicion for cardiogenic shock, sepsis.  We will plan on labs, imaging and reassess.   Labs and imaging personally reviewed and interpreted:   CBC: leukocytosis noted at 15.5, has been high in the past, unlikely cause of current symptoms BMP: unremarkable Hepatic function panel: bilirubin noted to be 1.5, high in past, unlikely cause of symptoms Respiratory panel: COVID positive, likely explains symptoms of weakness and cough UA: no infectious process noted CT head: no acute abnormalities, findings consistent with previous study DG chest: no evidence of pneumonia or effusion      MDM Rules/Calculators/A&P                         Patient presents for weakness. Unfortunately is a poor historian but cough noted and said to be new by nursing manager when facility was called. Facility noted that patients blood pressure was 123XX123 systolic but has been normal throughout ED course. Patient has also been in no acute distress with pink, warm, and dry skin throughout. COVID positive on respiratory panel explains weak symptoms as well as cough. No neuro deficits noted, CT head unremarkable. DG chest shows no evidence of pneumonia or widespread infectious process. All other labs unremarkable. History of recurrent UTIs but not present today.  Due to age and medical history, will begin Molnupiravir for COVID symptoms and Tessalon perles for cough management.    Final Clinical Impression(s) / ED Diagnoses Final diagnoses:  COVID-19  Generalized weakness    Rx / DC Orders ED Discharge Orders          Ordered    molnupiravir EUA 200 mg CAPS  2 times daily         02/05/21 1331    benzonatate (TESSALON) 100 MG capsule  Every 8 hours        02/05/21 1331          An After Visit Summary was printed and given to the patient.    Bud Face, [place] 02/05/21 1343    Milton Ferguson, MD 02/07/21 0830

## 2021-02-05 NOTE — ED Triage Notes (Signed)
Pt to the ED [facility] with generalized weakness from [facility].

## 2021-02-05 NOTE — Discharge Instructions (Addendum)
Patient positive for COVID, urine negative for infection, CT head shows no acute abnormalities. Take Molnupiravir for COVID and benzonate for cough.

## 2021-02-13 DIAGNOSIS — U071 COVID-19: Secondary | ICD-10-CM | POA: Diagnosis not present

## 2021-02-13 DIAGNOSIS — F0150 Vascular dementia without behavioral disturbance: Secondary | ICD-10-CM | POA: Diagnosis not present

## 2021-02-14 DIAGNOSIS — E1129 Type 2 diabetes mellitus with other diabetic kidney complication: Secondary | ICD-10-CM | POA: Diagnosis not present

## 2021-02-14 DIAGNOSIS — F0150 Vascular dementia without behavioral disturbance: Secondary | ICD-10-CM | POA: Diagnosis not present

## 2021-02-14 DIAGNOSIS — I69318 Other symptoms and signs involving cognitive functions following cerebral infarction: Secondary | ICD-10-CM | POA: Diagnosis not present

## 2021-02-14 DIAGNOSIS — Z7984 Long term (current) use of oral hypoglycemic drugs: Secondary | ICD-10-CM | POA: Diagnosis not present

## 2021-02-14 DIAGNOSIS — Z794 Long term (current) use of insulin: Secondary | ICD-10-CM | POA: Diagnosis not present

## 2021-02-14 DIAGNOSIS — I700 Atherosclerosis of aorta: Secondary | ICD-10-CM | POA: Diagnosis not present

## 2021-02-14 DIAGNOSIS — D510 Vitamin B12 deficiency anemia due to intrinsic factor deficiency: Secondary | ICD-10-CM | POA: Diagnosis not present

## 2021-02-14 DIAGNOSIS — I2510 Atherosclerotic heart disease of native coronary artery without angina pectoris: Secondary | ICD-10-CM | POA: Diagnosis not present

## 2021-02-14 DIAGNOSIS — M6281 Muscle weakness (generalized): Secondary | ICD-10-CM | POA: Diagnosis not present

## 2021-03-27 DIAGNOSIS — D510 Vitamin B12 deficiency anemia due to intrinsic factor deficiency: Secondary | ICD-10-CM | POA: Diagnosis not present

## 2021-03-27 DIAGNOSIS — E1129 Type 2 diabetes mellitus with other diabetic kidney complication: Secondary | ICD-10-CM | POA: Diagnosis not present

## 2021-03-27 DIAGNOSIS — F01B4 Vascular dementia, moderate, with anxiety: Secondary | ICD-10-CM | POA: Diagnosis not present

## 2021-03-27 DIAGNOSIS — Z79899 Other long term (current) drug therapy: Secondary | ICD-10-CM | POA: Diagnosis not present

## 2021-03-27 DIAGNOSIS — I2510 Atherosclerotic heart disease of native coronary artery without angina pectoris: Secondary | ICD-10-CM | POA: Diagnosis not present

## 2021-04-07 DIAGNOSIS — E1122 Type 2 diabetes mellitus with diabetic chronic kidney disease: Secondary | ICD-10-CM | POA: Diagnosis not present

## 2021-04-07 DIAGNOSIS — E785 Hyperlipidemia, unspecified: Secondary | ICD-10-CM | POA: Diagnosis not present

## 2021-04-07 DIAGNOSIS — R7309 Other abnormal glucose: Secondary | ICD-10-CM | POA: Diagnosis not present

## 2021-04-07 DIAGNOSIS — F01511 Vascular dementia, unspecified severity, with agitation: Secondary | ICD-10-CM | POA: Diagnosis not present

## 2021-04-09 DIAGNOSIS — N390 Urinary tract infection, site not specified: Secondary | ICD-10-CM | POA: Diagnosis not present

## 2021-04-09 DIAGNOSIS — M6281 Muscle weakness (generalized): Secondary | ICD-10-CM | POA: Diagnosis not present

## 2021-04-09 DIAGNOSIS — E785 Hyperlipidemia, unspecified: Secondary | ICD-10-CM | POA: Diagnosis not present

## 2021-04-09 DIAGNOSIS — E1129 Type 2 diabetes mellitus with other diabetic kidney complication: Secondary | ICD-10-CM | POA: Diagnosis not present

## 2021-04-09 DIAGNOSIS — F0150 Vascular dementia without behavioral disturbance: Secondary | ICD-10-CM | POA: Diagnosis not present

## 2021-04-09 DIAGNOSIS — Z7982 Long term (current) use of aspirin: Secondary | ICD-10-CM | POA: Diagnosis not present

## 2021-04-09 DIAGNOSIS — I2510 Atherosclerotic heart disease of native coronary artery without angina pectoris: Secondary | ICD-10-CM | POA: Diagnosis not present

## 2021-04-09 DIAGNOSIS — Z9181 History of falling: Secondary | ICD-10-CM | POA: Diagnosis not present

## 2021-04-09 DIAGNOSIS — I10 Essential (primary) hypertension: Secondary | ICD-10-CM | POA: Diagnosis not present

## 2021-04-09 DIAGNOSIS — I700 Atherosclerosis of aorta: Secondary | ICD-10-CM | POA: Diagnosis not present

## 2021-04-15 DIAGNOSIS — I10 Essential (primary) hypertension: Secondary | ICD-10-CM | POA: Diagnosis not present

## 2021-04-15 DIAGNOSIS — E1129 Type 2 diabetes mellitus with other diabetic kidney complication: Secondary | ICD-10-CM | POA: Diagnosis not present

## 2021-04-15 DIAGNOSIS — E785 Hyperlipidemia, unspecified: Secondary | ICD-10-CM | POA: Diagnosis not present

## 2021-04-15 DIAGNOSIS — I700 Atherosclerosis of aorta: Secondary | ICD-10-CM | POA: Diagnosis not present

## 2021-04-15 DIAGNOSIS — Z9181 History of falling: Secondary | ICD-10-CM | POA: Diagnosis not present

## 2021-04-15 DIAGNOSIS — M6281 Muscle weakness (generalized): Secondary | ICD-10-CM | POA: Diagnosis not present

## 2021-04-15 DIAGNOSIS — Z7982 Long term (current) use of aspirin: Secondary | ICD-10-CM | POA: Diagnosis not present

## 2021-04-15 DIAGNOSIS — N390 Urinary tract infection, site not specified: Secondary | ICD-10-CM | POA: Diagnosis not present

## 2021-04-15 DIAGNOSIS — F0150 Vascular dementia without behavioral disturbance: Secondary | ICD-10-CM | POA: Diagnosis not present

## 2021-04-15 DIAGNOSIS — I2510 Atherosclerotic heart disease of native coronary artery without angina pectoris: Secondary | ICD-10-CM | POA: Diagnosis not present

## 2021-04-17 DIAGNOSIS — D72829 Elevated white blood cell count, unspecified: Secondary | ICD-10-CM | POA: Diagnosis not present

## 2021-04-17 DIAGNOSIS — N390 Urinary tract infection, site not specified: Secondary | ICD-10-CM | POA: Diagnosis not present

## 2021-04-17 DIAGNOSIS — N289 Disorder of kidney and ureter, unspecified: Secondary | ICD-10-CM | POA: Diagnosis not present

## 2021-05-26 DIAGNOSIS — E1129 Type 2 diabetes mellitus with other diabetic kidney complication: Secondary | ICD-10-CM | POA: Diagnosis not present

## 2021-06-18 DIAGNOSIS — N3000 Acute cystitis without hematuria: Secondary | ICD-10-CM | POA: Diagnosis not present

## 2021-07-01 DIAGNOSIS — E1129 Type 2 diabetes mellitus with other diabetic kidney complication: Secondary | ICD-10-CM | POA: Diagnosis not present

## 2021-07-08 DIAGNOSIS — I700 Atherosclerosis of aorta: Secondary | ICD-10-CM | POA: Diagnosis not present

## 2021-07-08 DIAGNOSIS — E1122 Type 2 diabetes mellitus with diabetic chronic kidney disease: Secondary | ICD-10-CM | POA: Diagnosis not present

## 2021-07-08 DIAGNOSIS — F0150 Vascular dementia without behavioral disturbance: Secondary | ICD-10-CM | POA: Diagnosis not present

## 2021-07-08 DIAGNOSIS — I2510 Atherosclerotic heart disease of native coronary artery without angina pectoris: Secondary | ICD-10-CM | POA: Diagnosis not present

## 2021-08-03 ENCOUNTER — Inpatient Hospital Stay (HOSPITAL_COMMUNITY)
Admission: EM | Admit: 2021-08-03 | Discharge: 2021-08-05 | DRG: 871 | Disposition: A | Discharge status: Skilled Nursing Facility | Payer: PPO | Source: Skilled Nursing Facility | Attending: Internal Medicine | Admitting: Internal Medicine

## 2021-08-03 ENCOUNTER — Other Ambulatory Visit: Payer: Self-pay

## 2021-08-03 ENCOUNTER — Observation Stay (HOSPITAL_COMMUNITY): Payer: PPO

## 2021-08-03 ENCOUNTER — Encounter (HOSPITAL_COMMUNITY): Payer: Self-pay

## 2021-08-03 ENCOUNTER — Emergency Department (HOSPITAL_COMMUNITY): Payer: PPO

## 2021-08-03 DIAGNOSIS — Z955 Presence of coronary angioplasty implant and graft: Secondary | ICD-10-CM

## 2021-08-03 DIAGNOSIS — Z20822 Contact with and (suspected) exposure to covid-19: Secondary | ICD-10-CM | POA: Diagnosis not present

## 2021-08-03 DIAGNOSIS — I2510 Atherosclerotic heart disease of native coronary artery without angina pectoris: Secondary | ICD-10-CM | POA: Diagnosis present

## 2021-08-03 DIAGNOSIS — Z7982 Long term (current) use of aspirin: Secondary | ICD-10-CM

## 2021-08-03 DIAGNOSIS — R0602 Shortness of breath: Secondary | ICD-10-CM | POA: Diagnosis not present

## 2021-08-03 DIAGNOSIS — F01518 Vascular dementia, unspecified severity, with other behavioral disturbance: Secondary | ICD-10-CM | POA: Diagnosis present

## 2021-08-03 DIAGNOSIS — R9431 Abnormal electrocardiogram [ECG] [EKG]: Secondary | ICD-10-CM | POA: Diagnosis not present

## 2021-08-03 DIAGNOSIS — Z794 Long term (current) use of insulin: Secondary | ICD-10-CM

## 2021-08-03 DIAGNOSIS — Z7984 Long term (current) use of oral hypoglycemic drugs: Secondary | ICD-10-CM | POA: Diagnosis not present

## 2021-08-03 DIAGNOSIS — R0902 Hypoxemia: Secondary | ICD-10-CM | POA: Diagnosis not present

## 2021-08-03 DIAGNOSIS — J9601 Acute respiratory failure with hypoxia: Secondary | ICD-10-CM | POA: Diagnosis present

## 2021-08-03 DIAGNOSIS — I10 Essential (primary) hypertension: Secondary | ICD-10-CM | POA: Diagnosis not present

## 2021-08-03 DIAGNOSIS — E11649 Type 2 diabetes mellitus with hypoglycemia without coma: Secondary | ICD-10-CM | POA: Diagnosis not present

## 2021-08-03 DIAGNOSIS — Z87891 Personal history of nicotine dependence: Secondary | ICD-10-CM

## 2021-08-03 DIAGNOSIS — G9341 Metabolic encephalopathy: Secondary | ICD-10-CM | POA: Diagnosis not present

## 2021-08-03 DIAGNOSIS — E785 Hyperlipidemia, unspecified: Secondary | ICD-10-CM | POA: Diagnosis present

## 2021-08-03 DIAGNOSIS — I700 Atherosclerosis of aorta: Secondary | ICD-10-CM | POA: Diagnosis not present

## 2021-08-03 DIAGNOSIS — I491 Atrial premature depolarization: Secondary | ICD-10-CM | POA: Diagnosis not present

## 2021-08-03 DIAGNOSIS — A419 Sepsis, unspecified organism: Secondary | ICD-10-CM | POA: Diagnosis not present

## 2021-08-03 DIAGNOSIS — G9340 Encephalopathy, unspecified: Secondary | ICD-10-CM | POA: Diagnosis present

## 2021-08-03 DIAGNOSIS — I4891 Unspecified atrial fibrillation: Secondary | ICD-10-CM | POA: Diagnosis not present

## 2021-08-03 DIAGNOSIS — J189 Pneumonia, unspecified organism: Secondary | ICD-10-CM | POA: Diagnosis present

## 2021-08-03 DIAGNOSIS — J439 Emphysema, unspecified: Secondary | ICD-10-CM | POA: Diagnosis not present

## 2021-08-03 DIAGNOSIS — E119 Type 2 diabetes mellitus without complications: Secondary | ICD-10-CM

## 2021-08-03 DIAGNOSIS — I959 Hypotension, unspecified: Secondary | ICD-10-CM | POA: Diagnosis present

## 2021-08-03 DIAGNOSIS — Z9049 Acquired absence of other specified parts of digestive tract: Secondary | ICD-10-CM

## 2021-08-03 DIAGNOSIS — Z8673 Personal history of transient ischemic attack (TIA), and cerebral infarction without residual deficits: Secondary | ICD-10-CM | POA: Diagnosis not present

## 2021-08-03 DIAGNOSIS — Z79899 Other long term (current) drug therapy: Secondary | ICD-10-CM | POA: Diagnosis not present

## 2021-08-03 DIAGNOSIS — I4510 Unspecified right bundle-branch block: Secondary | ICD-10-CM | POA: Diagnosis not present

## 2021-08-03 DIAGNOSIS — R739 Hyperglycemia, unspecified: Secondary | ICD-10-CM | POA: Diagnosis not present

## 2021-08-03 HISTORY — DX: Cerebral infarction due to embolism of unspecified cerebral artery: I63.40

## 2021-08-03 LAB — GLUCOSE, CAPILLARY
Glucose-Capillary: 51 mg/dL — ABNORMAL LOW (ref 70–99)
Glucose-Capillary: 113 mg/dL — ABNORMAL HIGH (ref 70–99)
Glucose-Capillary: 80 mg/dL (ref 70–99)

## 2021-08-03 LAB — CBC WITH DIFFERENTIAL/PLATELET
Abs Immature Granulocytes: 0.26 10*3/uL — ABNORMAL HIGH (ref 0.00–0.07)
Basophils Absolute: 0.1 10*3/uL (ref 0.0–0.1)
Basophils Relative: 0 %
Eosinophils Absolute: 0 10*3/uL (ref 0.0–0.5)
Eosinophils Relative: 0 %
HCT: 38.9 % — ABNORMAL LOW (ref 39.0–52.0)
Hemoglobin: 13.2 g/dL (ref 13.0–17.0)
Immature Granulocytes: 1 %
Lymphocytes Relative: 4 %
Lymphs Abs: 1 10*3/uL (ref 0.7–4.0)
MCH: 32.8 pg (ref 26.0–34.0)
MCHC: 33.9 g/dL (ref 30.0–36.0)
MCV: 96.8 fL (ref 80.0–100.0)
Monocytes Absolute: 1.8 10*3/uL — ABNORMAL HIGH (ref 0.1–1.0)
Monocytes Relative: 8 %
Neutro Abs: 21.2 10*3/uL — ABNORMAL HIGH (ref 1.7–7.7)
Neutrophils Relative %: 87 %
Platelets: 214 10*3/uL (ref 150–400)
RBC: 4.02 MIL/uL — ABNORMAL LOW (ref 4.22–5.81)
RDW: 12.9 % (ref 11.5–15.5)
WBC: 24.3 10*3/uL — ABNORMAL HIGH (ref 4.0–10.5)
nRBC: 0 % (ref 0.0–0.2)

## 2021-08-03 LAB — COMPREHENSIVE METABOLIC PANEL
ALT: 21 U/L (ref 0–44)
AST: 17 U/L (ref 15–41)
Albumin: 3.3 g/dL — ABNORMAL LOW (ref 3.5–5.0)
Alkaline Phosphatase: 52 U/L (ref 38–126)
Anion gap: 8 (ref 5–15)
BUN: 21 mg/dL (ref 8–23)
CO2: 24 mmol/L (ref 22–32)
Calcium: 8.5 mg/dL — ABNORMAL LOW (ref 8.9–10.3)
Chloride: 107 mmol/L (ref 98–111)
Creatinine, Ser: 1.3 mg/dL — ABNORMAL HIGH (ref 0.61–1.24)
GFR, Estimated: 55 mL/min — ABNORMAL LOW (ref >60)
Glucose, Bld: 214 mg/dL — ABNORMAL HIGH (ref 70–99)
Potassium: 4.3 mmol/L (ref 3.5–5.1)
Sodium: 139 mmol/L (ref 135–145)
Total Bilirubin: 1.1 mg/dL (ref 0.3–1.2)
Total Protein: 6.1 g/dL — ABNORMAL LOW (ref 6.5–8.1)

## 2021-08-03 LAB — URINALYSIS, ROUTINE W REFLEX MICROSCOPIC
Bilirubin Urine: NEGATIVE
Glucose, UA: NEGATIVE mg/dL
Hgb urine dipstick: NEGATIVE
Ketones, ur: 5 mg/dL — AB
Leukocytes,Ua: NEGATIVE
Nitrite: NEGATIVE
Protein, ur: NEGATIVE mg/dL
Specific Gravity, Urine: 1.02 (ref 1.005–1.030)
pH: 5 (ref 5.0–8.0)

## 2021-08-03 LAB — RESP PANEL BY RT-PCR (FLU A&B, COVID) ARPGX2
Influenza A by PCR: NEGATIVE
Influenza B by PCR: NEGATIVE
SARS Coronavirus 2 by RT PCR: NEGATIVE

## 2021-08-03 LAB — LACTIC ACID, PLASMA
Lactic Acid, Venous: 2 mmol/L (ref 0.5–1.9)
Lactic Acid, Venous: 2.5 mmol/L (ref 0.5–1.9)
Lactic Acid, Venous: 2.1 mmol/L (ref 0.5–1.9)

## 2021-08-03 LAB — PROCALCITONIN: Procalcitonin: 0.22 ng/mL

## 2021-08-03 LAB — MAGNESIUM: Magnesium: 1.5 mg/dL — ABNORMAL LOW (ref 1.7–2.4)

## 2021-08-03 LAB — BRAIN NATRIURETIC PEPTIDE: B Natriuretic Peptide: 143 pg/mL — ABNORMAL HIGH (ref 0.0–100.0)

## 2021-08-03 MED ORDER — ACETAMINOPHEN 325 MG PO TABS
650.0000 mg | ORAL_TABLET | Freq: Four times a day (QID) | ORAL | Status: DC | PRN
  Administered 2021-08-04: 650 mg via ORAL
  Filled 2021-08-03: qty 2

## 2021-08-03 MED ORDER — DEXTROSE 50 % IV SOLN
INTRAVENOUS | Status: AC
  Filled 2021-08-03: qty 50

## 2021-08-03 MED ORDER — INSULIN ASPART 100 UNIT/ML IJ SOLN: 0.0000 [IU] | Freq: Three times a day (TID) | INTRAMUSCULAR | Status: DC

## 2021-08-03 MED ORDER — SODIUM CHLORIDE 0.9 % IV BOLUS
1000.0000 mL | Freq: Once | INTRAVENOUS | Status: AC
  Administered 2021-08-03: 1000 mL via INTRAVENOUS

## 2021-08-03 MED ORDER — INSULIN ASPART 100 UNIT/ML IJ SOLN: 0.0000 [IU] | Freq: Every day | INTRAMUSCULAR | Status: DC

## 2021-08-03 MED ORDER — GUAIFENESIN ER 600 MG PO TB12
600.0000 mg | ORAL_TABLET | Freq: Two times a day (BID) | ORAL | Status: DC
  Administered 2021-08-04 – 2021-08-05 (×3): 600 mg via ORAL
  Filled 2021-08-03 (×3): qty 1

## 2021-08-03 MED ORDER — ENOXAPARIN SODIUM 40 MG/0.4ML IJ SOSY
40.0000 mg | PREFILLED_SYRINGE | INTRAMUSCULAR | Status: DC
  Administered 2021-08-03 – 2021-08-04 (×2): 40 mg via SUBCUTANEOUS
  Filled 2021-08-03 (×2): qty 0.4

## 2021-08-03 MED ORDER — LORAZEPAM 2 MG/ML IJ SOLN
1.0000 mg | INTRAMUSCULAR | Status: DC | PRN
  Administered 2021-08-04: 2 mg via INTRAVENOUS
  Administered 2021-08-05: 1 mg via INTRAVENOUS
  Filled 2021-08-03 (×2): qty 1

## 2021-08-03 MED ORDER — METOPROLOL TARTRATE 25 MG PO TABS
12.5000 mg | ORAL_TABLET | Freq: Two times a day (BID) | ORAL | Status: DC
  Administered 2021-08-04 – 2021-08-05 (×3): 12.5 mg via ORAL
  Filled 2021-08-03 (×3): qty 1

## 2021-08-03 MED ORDER — LACTATED RINGERS IV BOLUS
1000.0000 mL | Freq: Once | INTRAVENOUS | Status: AC
  Administered 2021-08-03: 1000 mL via INTRAVENOUS

## 2021-08-03 MED ORDER — ONDANSETRON HCL 4 MG PO TABS: 4.0000 mg | ORAL_TABLET | Freq: Four times a day (QID) | ORAL | Status: DC | PRN

## 2021-08-03 MED ORDER — SODIUM CHLORIDE 0.9 % IV SOLN
2.0000 g | INTRAVENOUS | Status: DC
  Administered 2021-08-03 – 2021-08-04 (×2): 2 g via INTRAVENOUS
  Filled 2021-08-03 (×2): qty 20

## 2021-08-03 MED ORDER — IPRATROPIUM-ALBUTEROL 0.5-2.5 (3) MG/3ML IN SOLN
3.0000 mL | Freq: Four times a day (QID) | RESPIRATORY_TRACT | Status: DC
  Administered 2021-08-03: 3 mL via RESPIRATORY_TRACT
  Filled 2021-08-03: qty 3

## 2021-08-03 MED ORDER — ACETAMINOPHEN 650 MG RE SUPP: 650.0000 mg | Freq: Four times a day (QID) | RECTAL | Status: DC | PRN

## 2021-08-03 MED ORDER — SODIUM CHLORIDE 0.9 % IV BOLUS
500.0000 mL | Freq: Once | INTRAVENOUS | Status: AC
  Administered 2021-08-03: 500 mL via INTRAVENOUS

## 2021-08-03 MED ORDER — ASPIRIN EC 81 MG PO TBEC
81.0000 mg | DELAYED_RELEASE_TABLET | Freq: Every day | ORAL | Status: DC
  Administered 2021-08-03 – 2021-08-05 (×3): 81 mg via ORAL
  Filled 2021-08-03 (×3): qty 1

## 2021-08-03 MED ORDER — DEXTROSE 50 % IV SOLN
12.5000 g | INTRAVENOUS | Status: AC
  Administered 2021-08-03: 12.5 g via INTRAVENOUS

## 2021-08-03 MED ORDER — INSULIN GLARGINE-YFGN 100 UNIT/ML ~~LOC~~ SOLN
20.0000 [IU] | Freq: Every day | SUBCUTANEOUS | Status: DC
  Filled 2021-08-03 (×3): qty 0.2

## 2021-08-03 MED ORDER — LORAZEPAM 2 MG/ML IJ SOLN
1.0000 mg | Freq: Once | INTRAMUSCULAR | Status: AC
  Administered 2021-08-03: 1 mg via INTRAMUSCULAR
  Filled 2021-08-03: qty 1

## 2021-08-03 MED ORDER — ATORVASTATIN CALCIUM 20 MG PO TABS
20.0000 mg | ORAL_TABLET | Freq: Every day | ORAL | Status: DC
  Administered 2021-08-03 – 2021-08-04 (×2): 20 mg via ORAL
  Filled 2021-08-03 (×2): qty 1

## 2021-08-03 MED ORDER — IPRATROPIUM-ALBUTEROL 0.5-2.5 (3) MG/3ML IN SOLN
3.0000 mL | Freq: Two times a day (BID) | RESPIRATORY_TRACT | Status: DC
  Administered 2021-08-04 – 2021-08-05 (×3): 3 mL via RESPIRATORY_TRACT
  Filled 2021-08-03 (×3): qty 3

## 2021-08-03 MED ORDER — SODIUM CHLORIDE 0.9 % IV SOLN: INTRAVENOUS | Status: DC | PRN

## 2021-08-03 MED ORDER — SODIUM CHLORIDE 0.9 % IV SOLN
500.0000 mg | INTRAVENOUS | Status: DC
  Administered 2021-08-03 – 2021-08-04 (×2): 500 mg via INTRAVENOUS
  Filled 2021-08-03 (×2): qty 5

## 2021-08-03 MED ORDER — ZIPRASIDONE MESYLATE 20 MG IM SOLR
10.0000 mg | Freq: Once | INTRAMUSCULAR | Status: AC
  Administered 2021-08-03: 10 mg via INTRAMUSCULAR
  Filled 2021-08-03: qty 20

## 2021-08-03 MED ORDER — ONDANSETRON HCL 4 MG/2ML IJ SOLN: 4.0000 mg | Freq: Four times a day (QID) | INTRAMUSCULAR | Status: DC | PRN

## 2021-08-03 MED ORDER — RISPERIDONE 0.5 MG PO TABS
0.5000 mg | ORAL_TABLET | Freq: Two times a day (BID) | ORAL | Status: DC
  Administered 2021-08-04 – 2021-08-05 (×3): 0.5 mg via ORAL
  Filled 2021-08-03 (×3): qty 1

## 2021-08-03 MED ORDER — STERILE WATER FOR INJECTION IJ SOLN
INTRAMUSCULAR | Status: AC
  Filled 2021-08-03: qty 10

## 2021-08-03 MED ORDER — PIPERACILLIN-TAZOBACTAM 3.375 G IVPB 30 MIN
3.3750 g | Freq: Once | INTRAVENOUS | Status: AC
  Administered 2021-08-03: 3.375 g via INTRAVENOUS
  Filled 2021-08-03: qty 50

## 2021-08-03 MED ORDER — LACTATED RINGERS IV SOLN: INTRAVENOUS | Status: DC

## 2021-08-03 NOTE — ED Notes (Signed)
Pt agitated yelling at staff and attempting to get out of bed. This nurse redirected patient to lay back in the bed.

## 2021-08-03 NOTE — ED Notes (Signed)
Upon assessment pt is standing at end of bed. Pt redirected back to bed. EDP notified.

## 2021-08-03 NOTE — ED Notes (Signed)
Patient continues to yell out "when can I go home, I want to go home". This nurse explained the process of a medical work up. Unable to redirect patient. Patient attempting to get out of bed. EDP aware.

## 2021-08-03 NOTE — Assessment & Plan Note (Signed)
Continue home regimen of metoprolol Blood pressure currently stable

## 2021-08-03 NOTE — ED Notes (Signed)
Pt O2 sats 86%. Pt removed nasal cannula. Nasal cannula placed back on pt 2L. O2 sats now 96%. Pt educated on importance of wearing nasal cannula.

## 2021-08-03 NOTE — Assessment & Plan Note (Addendum)
Will resume outpatient regimen on discharge A1c 6.1 indicating good control

## 2021-08-03 NOTE — ED Notes (Incomplete)
Pt O2 89-90% on RA. Pt placed on 2L [place]. Pt O2

## 2021-08-03 NOTE — Assessment & Plan Note (Addendum)
Noted to be hypoxic on room air.  Notes indicate that O2 sats of 86% on room air which improved when supplemental oxygen was applied. Likely related to pneumonia Today he was able to wean off oxygen and ambulate on room air without any hypoxia

## 2021-08-03 NOTE — ED Notes (Addendum)
Pt refusing to wear nasal cannula and pulse ox. Pt educated on importance of monitoring pulse ox. Pt continues to take off pulse ox monitor.

## 2021-08-03 NOTE — ED Provider Notes (Signed)
[facility] Provider Note   CSN: [id number] Arrival date & time: 08/03/21  1043     History  Chief Complaint  Patient presents with   Hypotension    Jacob Little is a 86 y.o. male.  Patient sent by nursing home for concern for urinary tract infection due to increased fatigue and activity.  Symptoms ongoing for 1 day.  No reports of fevers or cough or vomiting or diarrhea.  Patient himself states he is not sure was going on and just wants to go back home.  He has a history of dementia unable to get history review systems from patient.      Home Medications Prior to Admission medications   Medication Sig Start Date End Date Taking? Authorizing Provider  acetaminophen (TYLENOL) 325 MG tablet Take 2 tablets (650 mg total) by mouth every 4 (four) hours as needed for mild pain (or temp > 37.5 C (99.5 F)). 11/12/17   Barton Dubois, MD  albuterol (PROVENTIL HFA;VENTOLIN HFA) 108 (90 Base) MCG/ACT inhaler Inhale 2 puffs into the lungs every 6 (six) hours as needed for wheezing or shortness of breath. 03/16/18   Kathie Dike, MD  ASPIRIN LOW DOSE 81 MG EC tablet TAKE (1) TABLET BY MOUTH ONCE DAILY. Patient taking differently: Take 81 mg by mouth daily. 09/20/20   Satira Sark, MD  atorvastatin (LIPITOR) 20 MG tablet Take 20 mg by mouth daily.    [provider]  benzonatate (TESSALON) 100 MG capsule Take 1 capsule (100 mg total) by mouth every 8 (eight) hours. 02/05/21   Smoot, Sarah A, PA-C  cyanocobalamin (,VITAMIN B-12,) 1000 MCG/ML injection Inject 1,000 mcg into the muscle every 30 (thirty) days.  09/14/17   [provider]  Dextromethorphan-guaiFENesin (TUSSIN DM PO) Take 5 mLs by mouth every 6 (six) hours as needed (cough/congestion).    [provider]  glimepiride (AMARYL) 1 MG tablet Take 1 tablet (1 mg total) by mouth every morning. 01/07/18 10/04/19  Barton Dubois, MD  insulin glargine (LANTUS) 100 UNIT/ML injection Inject 22  Units into the skin daily. Hold if BS<60. Notify MD if BS>400    [provider]  isosorbide mononitrate (IMDUR) 30 MG 24 hr tablet Take 0.5 tablets (15 mg total) by mouth 2 (two) times daily. 01/07/18 02/05/21  Barton Dubois, MD  JANUVIA 100 MG tablet Take 1 tablet by mouth daily. 11/04/18   [provider]  metoprolol tartrate (LOPRESSOR) 25 MG tablet Take 0.5 tablets (12.5 mg total) by mouth 2 (two) times daily. 11/12/17   Barton Dubois, MD  nitroGLYCERIN (NITROSTAT) 0.4 MG SL tablet Place 0.4 mg under the tongue every 5 (five) minutes as needed for chest pain.    [provider]  ondansetron (ZOFRAN ODT) 8 MG disintegrating tablet Take 1 tablet (8 mg total) by mouth every 8 (eight) hours as needed for nausea. Patient not taking: No sig reported 02/24/19   Varney Biles, MD  risperiDONE (RISPERDAL) 0.25 MG tablet Take 1 tablet by mouth daily. 11/04/18   [provider]      Allergies    Patient has no known allergies.    Review of Systems   Review of Systems  Unable to perform ROS: Dementia   Physical Exam Updated Vital Signs BP 128/64    Pulse 69    Temp 97.9 F (36.6 C) (Oral)    Resp 20    Ht 5\' 8"  (1.727 m)    Wt 66 kg  SpO2 97%    BMI 22.12 kg/m  Physical Exam Constitutional:      Appearance: He is well-developed.  HENT:     Head: Normocephalic.     Nose: Nose normal.  Eyes:     Extraocular Movements: Extraocular movements intact.  Cardiovascular:     Rate and Rhythm: Normal rate.  Pulmonary:     Effort: Pulmonary effort is normal.  Skin:    Coloration: Skin is not jaundiced.  Neurological:     Mental Status: He is alert. Mental status is at baseline.    ED Results / Procedures / Treatments   Labs (all labs ordered are listed, but only abnormal results are displayed) Labs Reviewed  CBC WITH DIFFERENTIAL/PLATELET - Abnormal; Notable for the following components:      Result Value   WBC 24.3 (*)    RBC 4.02 (*)    HCT 38.9 (*)     Neutro Abs 21.2 (*)    Monocytes Absolute 1.8 (*)    Abs Immature Granulocytes 0.26 (*)    All other components within normal limits  COMPREHENSIVE METABOLIC PANEL - Abnormal; Notable for the following components:   Glucose, Bld 214 (*)    Creatinine, Ser 1.30 (*)    Calcium 8.5 (*)    Total Protein 6.1 (*)    Albumin 3.3 (*)    GFR, Estimated 55 (*)    All other components within normal limits  LACTIC ACID, PLASMA - Abnormal; Notable for the following components:   Lactic Acid, Venous 2.0 (*)    All other components within normal limits  LACTIC ACID, PLASMA - Abnormal; Notable for the following components:   Lactic Acid, Venous 2.5 (*)    All other components within normal limits  URINALYSIS, ROUTINE W REFLEX MICROSCOPIC - Abnormal; Notable for the following components:   Ketones, ur 5 (*)    All other components within normal limits  BRAIN NATRIURETIC PEPTIDE - Abnormal; Notable for the following components:   B Natriuretic Peptide 143.0 (*)    All other components within normal limits  CULTURE, BLOOD (ROUTINE X 2)  CULTURE, BLOOD (ROUTINE X 2)  RESP PANEL BY RT-PCR (FLU A&B, COVID) ARPGX2  URINE CULTURE  PROCALCITONIN    EKG EKG Interpretation  Date/Time:  Sunday August 03 2021 11:04:45 EST Ventricular Rate:  78 PR Interval:  188 QRS Duration: 133 QT Interval:  443 QTC Calculation: 505 R Axis:   -83 Text Interpretation: Sinus rhythm Multiple premature complexes, vent & supraven Right bundle branch block Inferior infarct, old Lateral leads are also involved Confirmed by Thamas Jaegers ([id number]) on 08/03/2021 2:48:09 PM  Radiology DG Chest Port 1 View  Result Date: 08/03/2021 CLINICAL DATA:  sob EXAM: PORTABLE CHEST 1 VIEW COMPARISON:  Multiple priors, most recent 02/05/2021. FINDINGS: No consolidation. Mild streaky left basilar opacities, which appear chronic and likely represent atelectasis/scar. No visible pleural effusions or pneumothorax. Cardiomediastinal  silhouette is unchanged. IMPRESSION: No evidence of acute cardiopulmonary disease. Electronically Signed   By: Margaretha Sheffield M.D.   On: 08/03/2021 12:10    Procedures .Critical Care Performed by: Luna Fuse, MD Authorized by: Luna Fuse, MD   Critical care provider statement:    Critical care time (minutes):  40   Critical care time was exclusive of:  Separately billable procedures and treating other patients and teaching time   Critical care was necessary to treat or prevent imminent or life-threatening deterioration of the following conditions:  Sepsis    Medications  Ordered in ED Medications  sodium chloride 0.9 % bolus 500 mL (0 mLs Intravenous Stopped 08/03/21 1252)  sodium chloride 0.9 % bolus 1,000 mL (0 mLs Intravenous Stopped 08/03/21 1428)  ziprasidone (GEODON) injection 10 mg (10 mg Intramuscular Given 08/03/21 1248)  sterile water (preservative free) injection (  Given 08/03/21 1250)  piperacillin-tazobactam (ZOSYN) IVPB 3.375 g (0 g Intravenous Stopped 08/03/21 1435)  LORazepam (ATIVAN) injection 1 mg (1 mg Intramuscular Given 08/03/21 1501)    ED Course/ Medical Decision Making/ A&P                           Medical Decision Making Amount and/or Complexity of Data Reviewed Labs: ordered. Radiology: ordered.  Risk Prescription drug management.   Patient on telemetry, normal rate and rhythm noted.  Chart review shows office visit for UTI on April 16, 2021.  Work-up today shows white count 24 lactic acid 2.5.  Patient had episode of hypotension blood pressure in the 78M systolic.  Appears transitory and improved with IV fluid resuscitation.  Source is unclear.  Blood cultures were sent patient started on broad-spectrum antibiotics however urinary tract infection is unremarkable, chest x-ray is unremarkable.  Blood cultures are sent and pending.  Patient is combative and yelling to go back to his home.  Given Geodon with minimal improvement.  Given Ativan  subsequently.  Hospitalist consulted for admission.        Final Clinical Impression(s) / ED Diagnoses Final diagnoses:  Sepsis, due to unspecified organism, unspecified whether acute organ dysfunction present [facility])    Rx / DC Orders ED Discharge Orders     None         Luna Fuse, MD 08/03/21 1523

## 2021-08-03 NOTE — ED Notes (Signed)
Pt O2 88% on RA. Pt placed on 2L [place]. O2 sats 96%.

## 2021-08-03 NOTE — Assessment & Plan Note (Addendum)
Patient noted to have productive cough CT chest confirms evidence of multifocal pneumonia Started on ceftriaxone and azithromycin Strep pneumo antigen negative, Legionella antigen pending Due to some concerns for aspiration, he was seen by speech therapy He underwent modified barium swallow study that did not show any evidence of aspiration Per SLP, it would be reasonable to continue the patient on regular consistency foods and thin liquids Continue on nebulizer treatments and Mucinex for pulmonary hygiene

## 2021-08-03 NOTE — Progress Notes (Signed)
Spoke with bedside Rn that received patient fromm ED to make aware a code sepsis was ordered. Encouraged her to call lab to obtain a lactic acid and to make sure the liter of LR that was ordered was started. Bedside RN stated they are trying to get another IV started to get everything started.  [facility]link will continue to monitor.

## 2021-08-03 NOTE — ED Notes (Signed)
Pt assisted via wheelchair to the bathroom back to stretcher.

## 2021-08-03 NOTE — ED Triage Notes (Signed)
Pt arrived via EMS with complaints of hypotension and possible UTI per [facility] staff. Per staff pt normally talking a lot, upon assessment pt dementia at baseline.

## 2021-08-03 NOTE — Progress Notes (Signed)
Set up patient's neb treatment.  Listened to [facility] which were C/D upon auscultation.  Patient did seem a little altered, had to prompt patient to get answers to questions. Patient's O2 sat was 94% on 2L at rate of 18.  Will continue to monitor.

## 2021-08-03 NOTE — Assessment & Plan Note (Signed)
Likely related to hypotension/underlying infection. Overall mental status appears to be back to baseline.

## 2021-08-03 NOTE — Progress Notes (Signed)
Elink following code sepsis °

## 2021-08-03 NOTE — ED Notes (Signed)
Attempted to call report x1. Nurse unavailable at this time. 

## 2021-08-03 NOTE — H&P (Signed)
History and Physical    Patient: Jacob Little XKG:[id number] DOB: September 14, 1935 DOA: 08/03/2021 DOS: the patient was seen and examined on 08/03/2021 PCP: Jacob Noble, MD  Patient coming from: [facility]  Chief Complaint:  Chief Complaint  Patient presents with   Hypotension    HPI: Jacob Little is a 86 y.o. male with medical history significant of vascular dementia, hypertension, hyperlipidemia, insulin-dependent diabetes, who is a resident of assisted living facility.  Per his family, he was sent to the emergency room today when he told facility staff that he did not feel well.  Further details around his state of health and complaints are unclear.  Patient is unable to provide any further history due to dementia.  He was evaluated in the emergency room where he was noted to be mildly hypotensive, but responded to IV fluids.  Noted to have significant leukocytosis and mildly elevated lactic acid.  Chest x-ray was unremarkable, but since he had a productive cough, CT chest was obtained that showed multifocal pneumonia.  He is being admitted for further management.  Review of Systems: unable to review all systems due to the inability of the patient to answer questions. Past Medical History:  Diagnosis Date   Atherosclerotic heart disease    Cerebral embolism with cerebral infarction [facility])    From LTC paperwork   Coronary artery disease    DES to circumflex 2006   Dementia ([place])    Encephalopathy    Essential hypertension    Hyperlipidemia    Rheumatic fever    Stroke ([place])    Type 2 diabetes mellitus ([place])    Past Surgical History:  Procedure Laterality Date   APPENDECTOMY     CORONARY ANGIOPLASTY WITH STENT PLACEMENT     Social History:  reports that he quit smoking about 16 years ago. His smoking use included cigarettes. He has never used smokeless tobacco. He reports that he does not drink alcohol and does not use drugs.  No Known Allergies  No family history on  file.  Prior to Admission medications   Medication Sig Start Date End Date Taking? Authorizing Provider  acetaminophen (TYLENOL) 325 MG tablet Take 2 tablets (650 mg total) by mouth every 4 (four) hours as needed for mild pain (or temp > 37.5 C (99.5 F)). 11/12/17   Barton Dubois, MD  albuterol (PROVENTIL HFA;VENTOLIN HFA) 108 (90 Base) MCG/ACT inhaler Inhale 2 puffs into the lungs every 6 (six) hours as needed for wheezing or shortness of breath. 03/16/18   Kathie Dike, MD  ASPIRIN LOW DOSE 81 MG EC tablet TAKE (1) TABLET BY MOUTH ONCE DAILY. Patient taking differently: Take 81 mg by mouth daily. 09/20/20   Satira Sark, MD  atorvastatin (LIPITOR) 20 MG tablet Take 20 mg by mouth daily.    [provider]  benzonatate (TESSALON) 100 MG capsule Take 1 capsule (100 mg total) by mouth every 8 (eight) hours. 02/05/21   Smoot, Sarah A, PA-C  cyanocobalamin (,VITAMIN B-12,) 1000 MCG/ML injection Inject 1,000 mcg into the muscle every 30 (thirty) days.  09/14/17   [provider]  Dextromethorphan-guaiFENesin (TUSSIN DM PO) Take 5 mLs by mouth every 6 (six) hours as needed (cough/congestion).    [provider]  glimepiride (AMARYL) 1 MG tablet Take 1 tablet (1 mg total) by mouth every morning. 01/07/18 10/04/19  Barton Dubois, MD  insulin glargine (LANTUS) 100 UNIT/ML injection Inject 22 Units into the skin daily. Hold if BS<60. Notify MD if  BS>400    [provider]  isosorbide mononitrate (IMDUR) 30 MG 24 hr tablet Take 0.5 tablets (15 mg total) by mouth 2 (two) times daily. 01/07/18 02/05/21  Barton Dubois, MD  JANUVIA 100 MG tablet Take 1 tablet by mouth daily. 11/04/18   [provider]  metoprolol tartrate (LOPRESSOR) 25 MG tablet Take 0.5 tablets (12.5 mg total) by mouth 2 (two) times daily. 11/12/17   Barton Dubois, MD  nitroGLYCERIN (NITROSTAT) 0.4 MG SL tablet Place 0.4 mg under the tongue every 5 (five) minutes as needed for chest pain.     [provider]  ondansetron (ZOFRAN ODT) 8 MG disintegrating tablet Take 1 tablet (8 mg total) by mouth every 8 (eight) hours as needed for nausea. Patient not taking: No sig reported 02/24/19   Varney Biles, MD  risperiDONE (RISPERDAL) 0.25 MG tablet Take 1 tablet by mouth daily. 11/04/18   [provider]  risperiDONE (RISPERDAL) 0.5 MG tablet Take 0.5 mg by mouth 2 (two) times daily. 07/25/21   [provider]    Physical Exam: Vitals:   08/03/21 1600 08/03/21 1707 08/03/21 1834 08/03/21 2048  BP: 110/83 (!) 126/93 (!) 115/55   Pulse: 73 88 84   Resp: 18 (!) 22 17   Temp:   99 F (37.2 C)   TempSrc:   Oral   SpO2: 99% 95% 100% 94%  Weight:      Height:       General exam: Alert, awake, no distress Respiratory system: Bilateral rhonchi. Respiratory effort normal. Cardiovascular system:RRR. No murmurs, rubs, gallops. Gastrointestinal system: Abdomen is nondistended, soft and nontender. No organomegaly or masses felt. Normal bowel sounds heard. Central nervous system: No focal neurological deficits. Extremities: No C/C/E, +pedal pulses Skin: No rashes, lesions or ulcers Psychiatry: Confused, pleasant   Data Reviewed:  Reviewed EKG, chest x-ray, CT chest, CBC and chemistry  Assessment and Plan: * Acute encephalopathy- (present on admission) Likely related to hypotension/underlying infection. Overall mental status appears to be back to baseline.  Sepsis ([place]) Sepsis ruled in, present on admission. Secondary to pneumonia Noted to have leukocytosis, tachypnea with respiratory rate of 25, ER notes also reports transient hypotension that improved with IV fluids He is also noted to have elevated lactic acid and has evidence of hypoxia requiring oxygen Blood cultures have been sent Continue on IV fluids and antibiotics   Insulin dependent type 2 diabetes mellitus ([place]) Continue home dose of Lantus Supplement with sliding scale Hold oral  agents  Multifocal pneumonia- (present on admission) Patient noted to have productive cough CT chest confirms evidence of multifocal pneumonia Started on ceftriaxone and azithromycin Check urinary antigens, sputum culture Start on nebulizer treatments and Mucinex for pulmonary hygiene  Acute respiratory failure with hypoxia ([place])- (present on admission) Noted to be hypoxic on room air.  Notes indicate that O2 sats of 86% on room air which improved when supplemental oxygen was applied. Likely related to pneumonia Wean off oxygen as tolerated  Vascular dementia with behavior disturbance- (present on admission) Patient is increasingly confused and agitated, likely related to underlying infectious process as well as change in environment in the hospital Continues home dose of Risperdal We will use Ativan as needed Frequent redirection Provide safety sitter if available  Essential hypertension- (present on admission) Continue home regimen of metoprolol Blood pressure currently stable  HLD (hyperlipidemia)- (present on admission) Continue statin       Advance Care Planning:   Code Status: Full Code discussed CODE STATUS  with patient's daughter who is his power of attorney.  They did have questions regarding limitations in care.  They would benefit from palliative care consultation to provide further information regarding decisions around [place] so that they may make an informed decision  Consults: Palliative care  Family Communication: Updated patient's daughter over the phone  Severity of Illness: The appropriate patient status for this patient is OBSERVATION. Observation status is judged to be reasonable and necessary in order to provide the required intensity of service to ensure the patient's safety. The patient's presenting symptoms, physical exam findings, and initial radiographic and laboratory data in the context of their medical condition is felt to place them at  decreased risk for further clinical deterioration. Furthermore, it is anticipated that the patient will be medically stable for discharge from the hospital within 2 midnights of admission.   Author: Kathie Dike, MD 08/03/2021 8:54 PM  For on call review [email or web address].

## 2021-08-03 NOTE — Assessment & Plan Note (Addendum)
Patient is increasingly confused and agitated, likely related to underlying infectious process as well as change in environment in the hospital Continues home dose of Risperdal We will use Ativan as needed Frequent redirection Hopefully this should improve once he is returned to his familiar environment

## 2021-08-03 NOTE — Assessment & Plan Note (Addendum)
Sepsis ruled in, present on admission. Secondary to pneumonia Noted to have leukocytosis, tachypnea with respiratory rate of 25, ER notes also report transient hypotension that improved with IV fluids He was also noted to have elevated lactic acid and has evidence of hypoxia requiring oxygen Blood cultures have been sent but have not shown any growth as of yet Lactic acid has normalized with IV fluids WBC count also in normal range now Antibiotics transitioned to po Sepsis physiology resolved.

## 2021-08-03 NOTE — Assessment & Plan Note (Signed)
Continue statin. 

## 2021-08-03 NOTE — ED Notes (Signed)
Patient attempting to get out of bed yelling at staff. Patient stating "this is bullshit when can I go home." This nurse attempted to verbally redirect patient to lay back in the bed. Pt continues to be agitated.

## 2021-08-03 NOTE — ED Notes (Signed)
Pt repositioned and given a warm blanket.

## 2021-08-03 NOTE — ED Notes (Signed)
Pt continues to attempt to get out of bed. This nurse educated patient on importance of laying in bed.

## 2021-08-04 DIAGNOSIS — J189 Pneumonia, unspecified organism: Secondary | ICD-10-CM | POA: Diagnosis present

## 2021-08-04 DIAGNOSIS — E11649 Type 2 diabetes mellitus with hypoglycemia without coma: Secondary | ICD-10-CM | POA: Diagnosis not present

## 2021-08-04 DIAGNOSIS — J9601 Acute respiratory failure with hypoxia: Secondary | ICD-10-CM | POA: Diagnosis present

## 2021-08-04 DIAGNOSIS — E785 Hyperlipidemia, unspecified: Secondary | ICD-10-CM | POA: Diagnosis present

## 2021-08-04 DIAGNOSIS — E119 Type 2 diabetes mellitus without complications: Secondary | ICD-10-CM | POA: Diagnosis not present

## 2021-08-04 DIAGNOSIS — Z794 Long term (current) use of insulin: Secondary | ICD-10-CM | POA: Diagnosis not present

## 2021-08-04 DIAGNOSIS — F01518 Vascular dementia, unspecified severity, with other behavioral disturbance: Secondary | ICD-10-CM | POA: Diagnosis present

## 2021-08-04 DIAGNOSIS — Z9049 Acquired absence of other specified parts of digestive tract: Secondary | ICD-10-CM | POA: Diagnosis not present

## 2021-08-04 DIAGNOSIS — Z7982 Long term (current) use of aspirin: Secondary | ICD-10-CM | POA: Diagnosis not present

## 2021-08-04 DIAGNOSIS — Z87891 Personal history of nicotine dependence: Secondary | ICD-10-CM | POA: Diagnosis not present

## 2021-08-04 DIAGNOSIS — G9341 Metabolic encephalopathy: Secondary | ICD-10-CM | POA: Diagnosis present

## 2021-08-04 DIAGNOSIS — G9340 Encephalopathy, unspecified: Secondary | ICD-10-CM | POA: Diagnosis present

## 2021-08-04 DIAGNOSIS — Z20822 Contact with and (suspected) exposure to covid-19: Secondary | ICD-10-CM | POA: Diagnosis present

## 2021-08-04 DIAGNOSIS — A419 Sepsis, unspecified organism: Secondary | ICD-10-CM | POA: Diagnosis present

## 2021-08-04 DIAGNOSIS — I2510 Atherosclerotic heart disease of native coronary artery without angina pectoris: Secondary | ICD-10-CM | POA: Diagnosis present

## 2021-08-04 DIAGNOSIS — Z79899 Other long term (current) drug therapy: Secondary | ICD-10-CM | POA: Diagnosis not present

## 2021-08-04 DIAGNOSIS — I10 Essential (primary) hypertension: Secondary | ICD-10-CM | POA: Diagnosis present

## 2021-08-04 DIAGNOSIS — I959 Hypotension, unspecified: Secondary | ICD-10-CM | POA: Diagnosis present

## 2021-08-04 DIAGNOSIS — Z7984 Long term (current) use of oral hypoglycemic drugs: Secondary | ICD-10-CM | POA: Diagnosis not present

## 2021-08-04 DIAGNOSIS — Z955 Presence of coronary angioplasty implant and graft: Secondary | ICD-10-CM | POA: Diagnosis not present

## 2021-08-04 DIAGNOSIS — Z8673 Personal history of transient ischemic attack (TIA), and cerebral infarction without residual deficits: Secondary | ICD-10-CM | POA: Diagnosis not present

## 2021-08-04 LAB — CBC WITH DIFFERENTIAL/PLATELET
Abs Immature Granulocytes: 0.08 10*3/uL — ABNORMAL HIGH (ref 0.00–0.07)
Basophils Absolute: 0 10*3/uL (ref 0.0–0.1)
Basophils Relative: 0 %
Eosinophils Absolute: 0.1 10*3/uL (ref 0.0–0.5)
Eosinophils Relative: 1 %
HCT: 33 % — ABNORMAL LOW (ref 39.0–52.0)
Hemoglobin: 11.1 g/dL — ABNORMAL LOW (ref 13.0–17.0)
Immature Granulocytes: 1 %
Lymphocytes Relative: 13 %
Lymphs Abs: 2 10*3/uL (ref 0.7–4.0)
MCH: 33 pg (ref 26.0–34.0)
MCHC: 33.6 g/dL (ref 30.0–36.0)
MCV: 98.2 fL (ref 80.0–100.0)
Monocytes Absolute: 1.4 10*3/uL — ABNORMAL HIGH (ref 0.1–1.0)
Monocytes Relative: 9 %
Neutro Abs: 12.6 10*3/uL — ABNORMAL HIGH (ref 1.7–7.7)
Neutrophils Relative %: 76 %
Platelets: 156 10*3/uL (ref 150–400)
RBC: 3.36 MIL/uL — ABNORMAL LOW (ref 4.22–5.81)
RDW: 13.2 % (ref 11.5–15.5)
WBC: 16.2 10*3/uL — ABNORMAL HIGH (ref 4.0–10.5)
nRBC: 0 % (ref 0.0–0.2)

## 2021-08-04 LAB — COMPREHENSIVE METABOLIC PANEL
ALT: 16 U/L (ref 0–44)
AST: 16 U/L (ref 15–41)
Albumin: 2.6 g/dL — ABNORMAL LOW (ref 3.5–5.0)
Alkaline Phosphatase: 42 U/L (ref 38–126)
Anion gap: 6 (ref 5–15)
BUN: 16 mg/dL (ref 8–23)
CO2: 23 mmol/L (ref 22–32)
Calcium: 8.2 mg/dL — ABNORMAL LOW (ref 8.9–10.3)
Chloride: 112 mmol/L — ABNORMAL HIGH (ref 98–111)
Creatinine, Ser: 0.91 mg/dL (ref 0.61–1.24)
GFR, Estimated: >60 mL/min (ref >60)
Glucose, Bld: 60 mg/dL — ABNORMAL LOW (ref 70–99)
Potassium: 3.4 mmol/L — ABNORMAL LOW (ref 3.5–5.1)
Sodium: 141 mmol/L (ref 135–145)
Total Bilirubin: 0.9 mg/dL (ref 0.3–1.2)
Total Protein: 5.1 g/dL — ABNORMAL LOW (ref 6.5–8.1)

## 2021-08-04 LAB — GLUCOSE, CAPILLARY
Glucose-Capillary: 126 mg/dL — ABNORMAL HIGH (ref 70–99)
Glucose-Capillary: 63 mg/dL — ABNORMAL LOW (ref 70–99)
Glucose-Capillary: 94 mg/dL (ref 70–99)
Glucose-Capillary: 110 mg/dL — ABNORMAL HIGH (ref 70–99)
Glucose-Capillary: 110 mg/dL — ABNORMAL HIGH (ref 70–99)
Glucose-Capillary: 130 mg/dL — ABNORMAL HIGH (ref 70–99)

## 2021-08-04 LAB — LACTIC ACID, PLASMA: Lactic Acid, Venous: 0.7 mmol/L (ref 0.5–1.9)

## 2021-08-04 LAB — HEMOGLOBIN A1C
Hgb A1c MFr Bld: 6.1 % — ABNORMAL HIGH (ref 4.8–5.6)
Mean Plasma Glucose: 128 mg/dL

## 2021-08-04 LAB — AMMONIA: Ammonia: 22 umol/L (ref 9–35)

## 2021-08-04 LAB — STREP PNEUMONIAE URINARY ANTIGEN: Strep Pneumo Urinary Antigen: NEGATIVE

## 2021-08-04 MED ORDER — GUAIFENESIN ER 600 MG PO TB12: 600.0000 mg | ORAL_TABLET | Freq: Two times a day (BID) | ORAL | 0 refills | Status: DC

## 2021-08-04 MED ORDER — CEFADROXIL 500 MG PO CAPS: 500.0000 mg | ORAL_CAPSULE | Freq: Two times a day (BID) | ORAL | 0 refills | Status: AC

## 2021-08-04 MED ORDER — AZITHROMYCIN 500 MG PO TABS
500.0000 mg | ORAL_TABLET | Freq: Every day | ORAL | 0 refills | Status: AC
Start: 2021-08-04 — End: 2021-08-09

## 2021-08-04 MED ORDER — DEXTROSE 50 % IV SOLN
INTRAVENOUS | Status: AC
  Filled 2021-08-04: qty 50

## 2021-08-04 MED ORDER — DEXTROSE 50 % IV SOLN
INTRAVENOUS | Status: AC
  Administered 2021-08-04: 25 mL
  Filled 2021-08-04: qty 50

## 2021-08-04 MED ORDER — QUETIAPINE FUMARATE 25 MG PO TABS
25.0000 mg | ORAL_TABLET | Freq: Every day | ORAL | Status: DC
  Administered 2021-08-04: 25 mg via ORAL
  Filled 2021-08-04: qty 1

## 2021-08-04 MED ORDER — IPRATROPIUM-ALBUTEROL 0.5-2.5 (3) MG/3ML IN SOLN: 3.0000 mL | Freq: Two times a day (BID) | RESPIRATORY_TRACT | 0 refills | Status: AC

## 2021-08-04 NOTE — Plan of Care (Signed)
°  Problem: Acute Rehab PT Goals(only PT should resolve) Goal: Pt Will Go Supine/Side To Sit Outcome: Progressing Flowsheets (Taken 08/04/2021 1556) Pt will go Supine/Side to Sit: with min guard assist Goal: Patient Will Transfer Sit To/From Stand Outcome: Progressing Flowsheets (Taken 08/04/2021 1556) Patient will transfer sit to/from stand:  with min guard assist  with minimal assist Goal: Pt Will Transfer Bed To Chair/Chair To Bed Outcome: Progressing Flowsheets (Taken 08/04/2021 1556) Pt will Transfer Bed to Chair/Chair to Bed:  min guard assist  with min assist Goal: Pt Will Ambulate Outcome: Progressing Flowsheets (Taken 08/04/2021 1556) Pt will Ambulate:  50 feet  with minimal assist  with rolling walker   3:57 PM, 08/04/21 Lonell Grandchild, MPT Physical Therapist with [facility] [phone] office [phone] mobile phone

## 2021-08-04 NOTE — Progress Notes (Signed)
SATURATION QUALIFICATIONS: (This note is used to comply with regulatory documentation for home oxygen)  Patient Saturations on Room Air at Rest = 97%  Patient Saturations on Room Air while Ambulating = 95%  Patient Saturations on 0 Liters of oxygen while Ambulating = 95%  Please briefly explain why patient needs home oxygen: Pt was able to keep an stable oxygen sat while ambulating.

## 2021-08-04 NOTE — Hospital Course (Addendum)
86 year old male with a history of dementia, hypertension, diabetes, who is a resident of an assisted living facility, admitted to the hospital with sepsis secondary to multifocal pneumonia.  He was also had a new oxygen requirement.  Started on IV antibiotics.  Clinically he is improving.  He had MBSS that did not show any evidence of aspiration. He is now off oxygen. Leukocytosis has resolved. Antibiotics transitioned to po

## 2021-08-04 NOTE — Progress Notes (Signed)
Inpatient Diabetes Program Recommendations  AACE/ADA: New Consensus Statement on Inpatient Glycemic Control   Target Ranges:  Prepandial:   less than 140 mg/dL      Peak postprandial:   less than 180 mg/dL (1-2 hours)      Critically ill patients:  140 - 180 mg/dL    Latest Reference Range & Units 08/04/21 05:48 08/04/21 06:27 08/04/21 07:29  Glucose-Capillary 70 - 99 mg/dL 63 (L) 126 (H) 94    Latest Reference Range & Units 08/03/21 19:02 08/03/21 19:36 08/03/21 21:13  Glucose-Capillary 70 - 99 mg/dL 51 (L) 113 (H) 80   Review of Glycemic Control  Diabetes history: DM2 Outpatient Diabetes medications: Amaryl 1 mg QAM, Lantus 22 units daily, Januvia 100 mg daily Current orders for Inpatient glycemic control: Semglee 20 units daily, Novolog 0-9 units TID with meals, Novolog 0-5 units QHS  Inpatient Diabetes Program Recommendations:    Insulin: No insulin given since admitted and fasting glucose 63 mg/dl this morning. Please discontinue Semglee for now.   Thanks, Barnie Alderman, RN, MSN, CDE Diabetes Coordinator Inpatient Diabetes Program [phone] (Team Pager from 8am to 5pm)

## 2021-08-04 NOTE — TOC Progression Note (Signed)
Transition of Care [facility]) - Progression Note    Patient Details  Name: Jacob Little MRN: [id number] Date of Birth: 1935-06-17  Transition [facility]) CM/SW Contact  Ihor Gully, LCSW Phone Number: 08/04/2021, 5:12 PM  Clinical Narrative:    Patient had SLP eval and his diet was changed to DYS 3 mechanical soft, nectar thick liquids. Discussed with [facility] and daughter. Daughter indicated that she had hoped that patient would be able to return to [facility], and wonders if being given ativan had interfered with his swallowing. [facility] cannot accept patient back with dietary modifications as listed.  Attending and SLP notified and will have MBSS completed.    Expected Discharge Plan: Assisted Living Barriers to Discharge: Continued Medical Work up  Expected Discharge Plan and Services Expected Discharge Plan: Assisted Living       Living arrangements for the past 2 months: [place] Expected Discharge Date: 08/04/21                                     Social Determinants of Health (SDOH) Interventions    Readmission Risk Interventions No flowsheet data found.

## 2021-08-04 NOTE — Evaluation (Signed)
Physical Therapy Evaluation Patient Details Name: Jacob Little MRN: [id number] DOB: 03-16-1936 Today's Date: 08/04/2021  History of Present Illness  Jacob Little is a 86 y.o. male with medical history significant of vascular dementia, hypertension, hyperlipidemia, insulin-dependent diabetes, who is a resident of assisted living facility.  Per his family, he was sent to the emergency room today when he told facility staff that he did not feel well.  Further details around his state of health and complaints are unclear.  Patient is unable to provide any further history due to dementia.  He was evaluated in the emergency room where he was noted to be mildly hypotensive, but responded to IV fluids.  Noted to have significant leukocytosis and mildly elevated lactic acid.  Chest x-ray was unremarkable, but since he had a productive cough, CT chest was obtained that showed multifocal pneumonia.  He is being admitted for further management.   Clinical Impression  Patient demonstrates unsteady labored gait with narrow base of support, requires Min/mod tactile assistance to avoid loss of balance when making turns and limited mostly due to fatigue.  Patient tolerated sitting up in chair after therapy - nursing staff notified.  Patient will benefit from continued skilled physical therapy in hospital and recommended venue below to increase strength, balance, endurance for safe ADLs and gait.       Recommendations for follow up therapy are one component of a multi-disciplinary discharge planning process, led by the attending physician.  Recommendations may be updated based on patient status, additional functional criteria and insurance authorization.  Follow Up Recommendations Skilled nursing-short term rehab (<3 hours/day)    Assistance Recommended at Discharge Frequent or constant Supervision/Assistance  Patient can return home with the following  A lot of help with walking and/or transfers;A lot of  help with bathing/dressing/bathroom;Assistance with cooking/housework;Help with stairs or ramp for entrance    Equipment Recommendations None recommended by PT  Recommendations for Other Services       Functional Status Assessment Patient has had a recent decline in their functional status and demonstrates the ability to make significant improvements in function in a reasonable and predictable amount of time.     Precautions / Restrictions Precautions Precautions: Fall Restrictions Weight Bearing Restrictions: No      Mobility  Bed Mobility Overal bed mobility: Needs Assistance Bed Mobility: Supine to Sit     Supine to sit: Min guard, Min assist     General bed mobility comments: increased time, labored movement    Transfers Overall transfer level: Needs assistance Equipment used: Rolling walker (2 wheels) Transfers: Sit to/from Stand, Bed to chair/wheelchair/BSC Sit to Stand: Min assist, Mod assist   Step pivot transfers: Min assist, Mod assist       General transfer comment: increased time, labored movement    Ambulation/Gait Ambulation/Gait assistance: Min assist, Mod assist Gait Distance (Feet): 20 Feet Assistive device: Rolling walker (2 wheels) Gait Pattern/deviations: Decreased step length - right, Decreased step length - left, Decreased stride length, Narrow base of support Gait velocity: decreased     General Gait Details: slow labored unsteady cadence with narrow base of support requiring increased time to make turns and verbal/tactile cueing for safety  Stairs            Wheelchair Mobility    Modified Rankin (Stroke Patients Only)       Balance Overall balance assessment: Needs assistance Sitting-balance support: Feet supported, No upper extremity supported Sitting balance-Leahy Scale: Good Sitting balance - Comments: seated  at EOB   Standing balance support: During functional activity, Reliant on assistive device for balance,  Bilateral upper extremity supported Standing balance-Leahy Scale: Poor Standing balance comment: fair/poor using RW                             Pertinent Vitals/Pain Pain Assessment Pain Assessment: No/denies pain    Home Living Family/patient expects to be discharged to:: Assisted living                 Home Equipment: Conservation officer, nature (2 wheels)      Prior Function Prior Level of Function : Needs assist       Physical Assist : Mobility (physical);ADLs (physical) Mobility (physical): Bed mobility;Transfers;Gait;Stairs   Mobility Comments: household ambulator using RW ADLs Comments: assisted by ALF staff     Hand Dominance   Dominant Hand: Right    Extremity/Trunk Assessment   Upper Extremity Assessment Upper Extremity Assessment: Generalized weakness    Lower Extremity Assessment Lower Extremity Assessment: Generalized weakness    Cervical / Trunk Assessment Cervical / Trunk Assessment: Normal  Communication   Communication: No difficulties  Cognition Arousal/Alertness: Awake/alert Behavior During Therapy: WFL for tasks assessed/performed Overall Cognitive Status: History of cognitive impairments - at baseline                                          General Comments      Exercises     Assessment/Plan    PT Assessment Patient needs continued PT services  PT Problem List Decreased strength;Decreased activity tolerance;Decreased balance       PT Treatment Interventions DME instruction;Gait training;Stair training;Functional mobility training;Therapeutic activities;Therapeutic exercise;Patient/family education;Balance training    PT Goals (Current goals can be found in the Care Plan section)  Acute Rehab PT Goals Patient Stated Goal: return home PT Goal Formulation: With patient Time For Goal Achievement: 08/18/21 Potential to Achieve Goals: Good    Frequency Min 3X/week     Co-evaluation                AM-PAC PT "6 Clicks" Mobility  Outcome Measure Help needed turning from your back to your side while in a flat bed without using bedrails?: A Little Help needed moving from lying on your back to sitting on the side of a flat bed without using bedrails?: A Little Help needed moving to and from a bed to a chair (including a wheelchair)?: A Lot Help needed standing up from a chair using your arms (e.g., wheelchair or bedside chair)?: A Lot Help needed to walk in hospital room?: A Lot Help needed climbing 3-5 steps with a railing? : A Lot 6 Click Score: 14    End of Session   Activity Tolerance: Patient tolerated treatment well;Patient limited by fatigue Patient left: in chair;with call bell/phone within reach;with chair alarm set Nurse Communication: Mobility status PT Visit Diagnosis: Unsteadiness on feet (R26.81);Other abnormalities of gait and mobility (R26.89);Muscle weakness (generalized) (M62.81)    Time: 8466-5993 PT Time Calculation (min) (ACUTE ONLY): 22 min   Charges:   PT Evaluation $PT Eval Moderate Complexity: 1 Mod PT Treatments $Therapeutic Activity: 8-22 mins        3:55 PM, 08/04/21 Lonell Grandchild, MPT Physical Therapist with [facility] [phone] office [phone] mobile phone

## 2021-08-04 NOTE — Progress Notes (Signed)
Progress Note   Patient: Jacob Little HDQ:[id number] DOB: 28-Mar-1936 DOA: 08/03/2021     0 DOS: the patient was seen and examined on 08/04/2021   Brief hospital course: 86 year old male with a history of dementia, hypertension, diabetes, who is a resident of an assisted living facility, admitted to the hospital with sepsis secondary to multifocal pneumonia.  He was also had a new oxygen requirement.  Started on IV antibiotics.  Clinically he is improving.  Currently undergoing swallow evaluation.  Assessment and Plan: * Acute encephalopathy- (present on admission) Likely related to hypotension/underlying infection. Overall mental status appears to be back to baseline.  Sepsis ([place]) Sepsis ruled in, present on admission. Secondary to pneumonia Noted to have leukocytosis, tachypnea with respiratory rate of 25, ER notes also reports transient hypotension that improved with IV fluids He is also noted to have elevated lactic acid and has evidence of hypoxia requiring oxygen Blood cultures have been sent but have not shown any growth as of yet Continue on IV fluids and antibiotics Lactic acid has normalized with IV fluids   Insulin dependent type 2 diabetes mellitus ([place]) He had some hypoglycemic episodes this morning Holding basal insulin for now Supplement with sliding scale Hold oral agents  Multifocal pneumonia- (present on admission) Patient noted to have productive cough CT chest confirms evidence of multifocal pneumonia I suspect that he may have an element of aspiration pneumonia Started on ceftriaxone and azithromycin Strep pneumo antigen negative, Legionella antigen pending Start on nebulizer treatments and Mucinex for pulmonary hygiene Seen by speech therapy and started on dysphagia 3 diet with nectar thick liquids due to concerns for possible aspiration Although, when assessment was done, it was shortly after he received Ativan We will try and perform modified barium  swallow study in the morning, hopefully when he is more awake and has not had recent sedatives  Acute respiratory failure with hypoxia ([place])- (present on admission) Noted to be hypoxic on room air.  Notes indicate that O2 sats of 86% on room air which improved when supplemental oxygen was applied. Likely related to pneumonia Today he was able to wean off oxygen and ambulate on room air without any hypoxia  Vascular dementia with behavior disturbance- (present on admission) Patient is increasingly confused and agitated, likely related to underlying infectious process as well as change in environment in the hospital Continues home dose of Risperdal We will use Ativan as needed Frequent redirection Hopefully this should improve once he is returned to his familiar environment We will recheck EKG to follow QT, and if stable can consider a dose of Seroquel tonight  Essential hypertension- (present on admission) Continue home regimen of metoprolol Blood pressure currently stable  HLD (hyperlipidemia)- (present on admission) Continue statin        Subjective: Patient is sitting in chair today currently appears to be calm.  Wants to go home.  Received Ativan earlier in the day.  Continues to have productive cough  Physical Exam: Vitals:   08/04/21 0538 08/04/21 0721 08/04/21 1231 08/04/21 1949  BP: (!) 129/50  116/67   Pulse: 84  73   Resp: 16  18   Temp: 98.2 F (36.8 C)  98 F (36.7 C)   TempSrc: Oral  Oral   SpO2: 96% 96% 100% 94%  Weight:      Height:       General exam: Alert, awake, no distress Respiratory system: Bilateral rhonchi.  Respiratory effort normal. Cardiovascular system:RRR. No murmurs, rubs, gallops. Gastrointestinal system:  Abdomen is nondistended, soft and nontender. No organomegaly or masses felt. Normal bowel sounds heard. Central nervous system: No focal neurological deficits. Extremities: No C/C/E, +pedal pulses Skin: No rashes, lesions or  ulcers Psychiatry: Confused   Data Reviewed:  Reviewed CBC and chemistry panel  Family Communication: Updated patient's daughter  Disposition: Status is: Inpatient The patient will require care spanning > 2 midnights and should be moved to inpatient because: Needs further work-up for pneumonia/dysphagia        Planned Discharge Destination: Skilled nursing facility     Time spent: 35 minutes  Author: Kathie Dike, MD 08/04/2021 9:45 PM  For on call review [email or web address].

## 2021-08-04 NOTE — Discharge Summary (Signed)
Physician Discharge Summary   Patient: Jacob Little MRN: [id number] DOB: [birth year]  Admit date:     08/03/2021  Discharge date: 08/05/21  Discharge Physician: Kathie Dike   PCP: [name], MD   Recommendations at discharge:   Repeat chest CT in 4 weeks to document resolution of pneumonia Recommend that palliative care follow up as outpatient to discuss goals of care    Discharge Diagnoses: Principal Problem:   Acute encephalopathy Active Problems:   HLD (hyperlipidemia)   Essential hypertension   Vascular dementia with behavior disturbance   Acute respiratory failure with hypoxia (HCC)   Multifocal pneumonia   Insulin dependent type 2 diabetes mellitus ([place])   Sepsis ([place])  Resolved Problems:   * No resolved hospital problems. *   Hospital Course: 86 year old male with a history of dementia, hypertension, diabetes, who is a resident of an assisted living facility, admitted to the hospital with sepsis secondary to multifocal pneumonia.  He was also had a new oxygen requirement.  Started on IV antibiotics.  Clinically he is improving.  He had MBSS that did not show any evidence of aspiration. He is now off oxygen. Leukocytosis has resolved. Antibiotics transitioned to po  Assessment and Plan: * Acute encephalopathy- (present on admission) Likely related to hypotension/underlying infection. Overall mental status appears to be back to baseline.  Sepsis ([place])- (present on admission) Sepsis ruled in, present on admission. Secondary to pneumonia Noted to have leukocytosis, tachypnea with respiratory rate of 25, ER notes also report transient hypotension that improved with IV fluids He was also noted to have elevated lactic acid and has evidence of hypoxia requiring oxygen Blood cultures have been sent but have not shown any growth as of yet Lactic acid has normalized with IV fluids WBC count also in normal range now Antibiotics transitioned to po Sepsis physiology  resolved.   Insulin dependent type 2 diabetes mellitus ([place]) Will resume outpatient regimen on discharge A1c 6.1 indicating good control  Multifocal pneumonia- (present on admission) Patient noted to have productive cough CT chest confirms evidence of multifocal pneumonia Started on ceftriaxone and azithromycin Strep pneumo antigen negative, Legionella antigen pending Due to some concerns for aspiration, he was seen by speech therapy He underwent modified barium swallow study that did not show any evidence of aspiration Per SLP, it would be reasonable to continue the patient on regular consistency foods and thin liquids Continue on nebulizer treatments and Mucinex for pulmonary hygiene   Acute respiratory failure with hypoxia ([place])- (present on admission) Noted to be hypoxic on room air.  Notes indicate that O2 sats of 86% on room air which improved when supplemental oxygen was applied. Likely related to pneumonia Today he was able to wean off oxygen and ambulate on room air without any hypoxia  Vascular dementia with behavior disturbance- (present on admission) Patient is increasingly confused and agitated, likely related to underlying infectious process as well as change in environment in the hospital Continues home dose of Risperdal We will use Ativan as needed Frequent redirection Hopefully this should improve once he is returned to his familiar environment   Essential hypertension- (present on admission) Continue home regimen of metoprolol Blood pressure currently stable  HLD (hyperlipidemia)- (present on admission) Continue statin           Consultants: speech therapy Procedures performed:   Disposition: Assisted living Diet recommendation:  Discharge Diet Orders (From admission, onward)     Start     Ordered   08/04/21 0000  Diet - low sodium heart healthy        08/04/21 1445           Cardiac and Carb modified diet, reduced concentrated sweets  diet  DISCHARGE MEDICATION: Allergies as of 08/05/2021   No Known Allergies      Medication List     TAKE these medications    acetaminophen 325 MG tablet Commonly known as: TYLENOL Take 2 tablets (650 mg total) by mouth every 4 (four) hours as needed for mild pain (or temp > 37.5 C (99.5 F)).   albuterol 108 (90 Base) MCG/ACT inhaler Commonly known as: VENTOLIN HFA Inhale 2 puffs into the lungs every 6 (six) hours as needed for wheezing or shortness of breath.   Aspirin Low Dose 81 MG EC tablet Generic drug: aspirin TAKE (1) TABLET BY MOUTH ONCE DAILY. What changed: See the new instructions.   atorvastatin 20 MG tablet Commonly known as: LIPITOR Take 20 mg by mouth daily.   azithromycin 500 MG tablet Commonly known as: Zithromax Take 1 tablet (500 mg total) by mouth daily for 5 days. Take 1 tablet daily for 3 days.   benzonatate 100 MG capsule Commonly known as: TESSALON Take 1 capsule (100 mg total) by mouth every 8 (eight) hours.   cefadroxil 500 MG capsule Commonly known as: DURICEF Take 1 capsule (500 mg total) by mouth 2 (two) times daily for 7 days.   cyanocobalamin 1000 MCG/ML injection Commonly known as: (VITAMIN B-12) Inject 1,000 mcg into the muscle every 30 (thirty) days.   glimepiride 1 MG tablet Commonly known as: Amaryl Take 1 tablet (1 mg total) by mouth every morning.   guaiFENesin 600 MG 12 hr tablet Commonly known as: MUCINEX Take 1 tablet (600 mg total) by mouth 2 (two) times daily.   insulin glargine 100 UNIT/ML injection Commonly known as: LANTUS Inject 22 Units into the skin daily. Hold if BS<60. Notify MD if BS>400   ipratropium-albuterol 0.5-2.5 (3) MG/3ML Soln Commonly known as: DUONEB Take 3 mLs by nebulization 2 (two) times daily.   isosorbide mononitrate 30 MG 24 hr tablet Commonly known as: IMDUR Take 0.5 tablets (15 mg total) by mouth 2 (two) times daily.   Januvia 100 MG tablet Generic drug: sitaGLIPtin Take 1 tablet  by mouth daily.   metoprolol tartrate 25 MG tablet Commonly known as: LOPRESSOR Take 0.5 tablets (12.5 mg total) by mouth 2 (two) times daily.   nitroGLYCERIN 0.4 MG SL tablet Commonly known as: NITROSTAT Place 0.4 mg under the tongue every 5 (five) minutes as needed for chest pain.   ondansetron 8 MG disintegrating tablet Commonly known as: Zofran ODT Take 1 tablet (8 mg total) by mouth every 8 (eight) hours as needed for nausea.   risperiDONE 0.5 MG tablet Commonly known as: RISPERDAL Take 0.5 mg by mouth 2 (two) times daily. What changed: Another medication with the same name was removed. Continue taking this medication, and follow the directions you see here.   TUSSIN DM PO Take 5 mLs by mouth every 6 (six) hours as needed (cough/congestion).               Durable Medical Equipment  (From admission, onward)           Start     Ordered   08/04/21 0000  For home use only DME Nebulizer machine       Question Answer Comment  Patient needs a nebulizer to treat with the following condition Pneumonia  Length of Need 6 Months      08/04/21 1445            Contact information for after-discharge care     Destination     [facility] .   Service: Assisted Living Contact information: [address] [place] [phone]                     Discharge Exam: Danley Danker Weights   08/03/21 1053  Weight: 66 kg   General exam: Alert, awake, no distress Respiratory system: Clear to auscultation. Respiratory effort normal. Cardiovascular system:RRR. No murmurs, rubs, gallops. Gastrointestinal system: Abdomen is nondistended, soft and nontender. No organomegaly or masses felt. Normal bowel sounds heard. Central nervous system:  No focal neurological deficits. Extremities: No C/C/E, +pedal pulses Skin: No rashes, lesions or ulcers Psychiatry: confused, pleasant   Condition at discharge:  stable  The results of significant diagnostics from this hospitalization (including imaging, microbiology, ancillary and laboratory) are listed below for reference.   Imaging Studies: CT CHEST WO CONTRAST  Result Date: 08/03/2021 CLINICAL DATA:  Respiratory illness. EXAM: CT CHEST WITHOUT CONTRAST TECHNIQUE: Multidetector CT imaging of the chest was performed following the standard protocol without IV contrast. RADIATION DOSE REDUCTION: This exam was performed according to the departmental dose-optimization program which includes automated exposure control, adjustment of the mA and/or kV according to patient size and/or use of iterative reconstruction technique. COMPARISON:  None. FINDINGS: Cardiovascular: Heart size upper normal. Coronary artery calcification is evident. Mild atherosclerotic calcification is noted in the wall of the thoracic aorta. Mediastinum/Nodes: No mediastinal lymphadenopathy. Upper normal lymph nodes in the mediastinum are presumably reactive. No evidence for gross hilar lymphadenopathy although assessment is limited by the lack of intravenous contrast on the current study. The esophagus has normal imaging features. There is no axillary lymphadenopathy. Lungs/Pleura: Centrilobular emphsyema noted. Patchy airspace disease noted posterior lung bases bilaterally with peripheral small airway impaction associated. Calcified granuloma noted right upper lobe. No suspicious pulmonary nodule or mass. No pleural effusion. Upper Abdomen: Unremarkable. Musculoskeletal: No worrisome lytic or sclerotic osseous abnormality. IMPRESSION: 1. Patchy airspace disease posterior lung bases bilaterally with peripheral small airway impaction. Imaging features compatible with multifocal pneumonia. 2. Aortic Atherosclerosis (ICD10-I70.0) and Emphysema (ICD10-J43.9). Electronically Signed   By: Misty Stanley M.D.   On: 08/03/2021 18:29   DG Chest Port 1 View  Result Date: 08/03/2021 CLINICAL DATA:  sob EXAM:  PORTABLE CHEST 1 VIEW COMPARISON:  Multiple priors, most recent 02/05/2021. FINDINGS: No consolidation. Mild streaky left basilar opacities, which appear chronic and likely represent atelectasis/scar. No visible pleural effusions or pneumothorax. Cardiomediastinal silhouette is unchanged. IMPRESSION: No evidence of acute cardiopulmonary disease. Electronically Signed   By: Margaretha Sheffield M.D.   On: 08/03/2021 12:10   DG Swallowing Func-Speech Pathology  Result Date: 08/05/2021 Table formatting from the original result was not included. Objective Swallowing Evaluation: Type of Study: MBS-Modified Barium Swallow Study  Patient Details Name: RAYNARD MAPPS MRN: [id number] Date of Birth: 14-Jan-1936 Today's Date: 08/05/2021 Time: SLP Start Time (ACUTE ONLY): 0930 -SLP Stop Time (ACUTE ONLY): 0956 SLP Time Calculation (min) (ACUTE ONLY): 26 min Past Medical History: Past Medical History: Diagnosis Date  Atherosclerotic heart disease   Cerebral embolism with cerebral infarction ([place])   From LTC paperwork  Coronary artery disease   DES to circumflex 2006  Dementia ([place])   Encephalopathy   Essential hypertension   Hyperlipidemia   Rheumatic fever  Stroke [facility])   Type 2 diabetes mellitus ([place])  Past Surgical History: Past Surgical History: Procedure Laterality Date  APPENDECTOMY    CORONARY ANGIOPLASTY WITH STENT PLACEMENT   HPI: LUCERO IDE is a 86 y.o. male with medical history significant of vascular dementia, hypertension, hyperlipidemia, insulin-dependent diabetes, who is a resident of assisted living facility.  Per his family, he was sent to the emergency room today when he told facility staff that he did not feel well.  Further details around his state of health and complaints are unclear.  Patient is unable to provide any further history due to dementia.  He was evaluated in the emergency room where he was noted to be mildly hypotensive, but responded to IV fluids.  Noted to have significant leukocytosis and  mildly elevated lactic acid.  Chest x-ray was unremarkable, but since he had a productive cough, CT chest was obtained that showed multifocal pneumonia.  He is being admitted for further management. BSE resulting in recommendation for MBSS to objectively assess the swallowing function.  Subjective: "Meh"  Recommendations for follow up therapy are one component of a multi-disciplinary discharge planning process, led by the attending physician.  Recommendations may be updated based on patient status, additional functional criteria and insurance authorization. Assessment / Plan / Recommendation Clinical Impressions 08/05/2021 Clinical Impression Pt presents with moderate primary cognitive based oral dysphagia and mild pharyngeal dysphagia. Pt was provided sedating medication this am ~4 hrs prior to the study which likely contributed to his lethargic presentation; Pt required frequent tactile and verbal cues to maintain appropriate positioning and alertness. Despite lethargy and sub-optimal positioning, no penetration or aspiration was visualized during this study. Oral phase is characterized by lingual pumping and prolonged AP transit, further note mild oral residue after the initial swallow. Pharyngeal phase is characterized by good laryngeal vestibule closure, good hyolaryngeal excursion and fully intact pharyngeal stripping wave, however occasional trace to min pharyngeal residue is noted after initial swallow and cleared with second swallow cued& reflexive. Pt was presented with barium tablet with puree and despite multiple presentations of thin and puree was unable to swallow tablet and it was ultimately expectorated; suspect inability to coordinate swallow of tablet was related to current mentation. Recommend upgrade Pt's diet to regular with thin liquids. Please ensure Pt is alert and responsive and sitting upright for all PO. Recommend meds be administered crushed with puree. There are no further ST needs noted  at this time. Results reviewed with RN and MD. SLP Visit Diagnosis Dysphagia, unspecified (R13.10) Attention and concentration deficit following -- Frontal lobe and executive function deficit following -- Impact on safety and function Moderate aspiration risk   Treatment Recommendations 08/05/2021 Treatment Recommendations Therapy as outlined in treatment plan below   Prognosis 08/05/2021 Prognosis for Safe Diet Advancement Good Barriers to Reach Goals Cognitive deficits Barriers/Prognosis Comment -- Diet Recommendations 08/05/2021 SLP Diet Recommendations Regular solids;Thin liquid Liquid Administration via Cup;No straw Medication Administration Crushed with puree Compensations Slow rate;Small sips/bites;Lingual sweep for clearance of pocketing;Multiple dry swallows after each bite/sip Postural Changes Seated upright at 90 degrees   Other Recommendations 08/05/2021 Recommended Consults -- Oral Care Recommendations Oral care BID Other Recommendations -- Follow Up Recommendations Skilled nursing-short term rehab (<3 hours/day) Assistance recommended at discharge -- Functional Status Assessment -- Frequency and Duration  08/04/2021 Speech Therapy Frequency (ACUTE ONLY) min 2x/week Treatment Duration 1 week   Oral Phase 08/05/2021 Oral Phase Impaired Oral - Pudding Teaspoon -- Oral - Pudding Cup -- Oral -  Honey Teaspoon -- Oral - Honey Cup -- Oral - Nectar Teaspoon NT Oral - Nectar Cup Impaired mastication;Lingual pumping;Piecemeal swallowing;Reduced posterior propulsion Oral - Nectar Straw NT Oral - Thin Teaspoon Impaired mastication;Lingual pumping;Piecemeal swallowing;Reduced posterior propulsion Oral - Thin Cup Impaired mastication;Lingual pumping;Piecemeal swallowing;Reduced posterior propulsion;Decreased bolus cohesion;Premature spillage Oral - Thin Straw Impaired mastication;Lingual pumping;Piecemeal swallowing;Reduced posterior propulsion;Decreased bolus cohesion;Premature spillage Oral - Puree Lingual pumping;Weak  lingual manipulation;Reduced posterior propulsion;Piecemeal swallowing;Lingual/palatal residue;Decreased bolus cohesion Oral - Mech Soft NT Oral - Regular Lingual pumping;Weak lingual manipulation;Reduced posterior propulsion;Piecemeal swallowing;Lingual/palatal residue;Decreased bolus cohesion Oral - Multi-Consistency NT Oral - Pill Holding of bolus Oral Phase - Comment --  Pharyngeal Phase 08/05/2021 Pharyngeal Phase Impaired Pharyngeal- Pudding Teaspoon -- Pharyngeal -- Pharyngeal- Pudding Cup -- Pharyngeal -- Pharyngeal- Honey Teaspoon -- Pharyngeal -- Pharyngeal- Honey Cup -- Pharyngeal -- Pharyngeal- Nectar Teaspoon NT Pharyngeal -- Pharyngeal- Nectar Cup Pharyngeal residue - valleculae;Pharyngeal residue - pyriform;Delayed swallow initiation-vallecula;Reduced pharyngeal peristalsis;Inter-arytenoid space residue;Lateral channel residue Pharyngeal -- Pharyngeal- Nectar Straw NT Pharyngeal -- Pharyngeal- Thin Teaspoon Pharyngeal residue - valleculae;Pharyngeal residue - pyriform;Delayed swallow initiation-vallecula;Reduced pharyngeal peristalsis;Inter-arytenoid space residue;Lateral channel residue Pharyngeal Material does not enter airway Pharyngeal- Thin Cup Pharyngeal residue - valleculae;Pharyngeal residue - pyriform;Delayed swallow initiation-vallecula;Reduced pharyngeal peristalsis;Inter-arytenoid space residue;Lateral channel residue Pharyngeal Material does not enter airway Pharyngeal- Thin Straw Pharyngeal residue - valleculae;Pharyngeal residue - pyriform;Delayed swallow initiation-vallecula;Reduced pharyngeal peristalsis;Inter-arytenoid space residue;Lateral channel residue Pharyngeal Material does not enter airway Pharyngeal- Puree Delayed swallow initiation-vallecula Pharyngeal -- Pharyngeal- Mechanical Soft -- Pharyngeal -- Pharyngeal- Regular -- Pharyngeal -- Pharyngeal- Multi-consistency -- Pharyngeal -- Pharyngeal- Pill -- Pharyngeal -- Pharyngeal Comment --  No flowsheet data found. Amelia H.  Roddie Mc, CCC-SLP Speech Language Pathologist Wende Bushy 08/05/2021, 11:01 AM                      Microbiology: Results for orders placed or performed during the hospital encounter of 08/03/21  Resp Panel by RT-PCR (Flu A&B, Covid) Nasopharyngeal Swab     Status: None   Collection Time: 08/03/21 11:44 AM   Specimen: Nasopharyngeal Swab; Nasopharyngeal(NP) swabs in vial transport medium  Result Value Ref Range Status   SARS Coronavirus 2 by RT PCR NEGATIVE NEGATIVE Final    Comment: (NOTE) SARS-CoV-2 target nucleic acids are NOT DETECTED.  The SARS-CoV-2 RNA is generally detectable in upper respiratory specimens during the acute phase of infection. The lowest concentration of SARS-CoV-2 viral copies this assay can detect is 138 copies/mL. A negative result does not preclude SARS-Cov-2 infection and should not be used as the sole basis for treatment or other patient management decisions. A negative result may occur with  improper specimen collection/handling, submission of specimen other than nasopharyngeal swab, presence of viral mutation(s) within the areas targeted by this assay, and inadequate number of viral copies(<138 copies/mL). A negative result must be combined with clinical observations, patient history, and epidemiological information. The expected result is Negative.  Fact Sheet for Patients:  EntrepreneurPulse.com.au  Fact Sheet for Healthcare Providers:  IncredibleEmployment.be  This test is no t yet approved or cleared by the [place] FDA and  has been authorized for detection and/or diagnosis of SARS-CoV-2 by FDA under an Emergency Use Authorization (EUA). This EUA will remain  in effect (meaning this test can be used) for the duration of the COVID-19 declaration under Section 564(b)(1) of the Act, 21 U.S.C.section 360bbb-3(b)(1), unless the authorization is terminated  or revoked sooner.       Influenza A by  PCR NEGATIVE NEGATIVE Final   Influenza B by PCR NEGATIVE NEGATIVE Final    Comment: (NOTE) The Xpert Xpress SARS-CoV-2/FLU/RSV plus assay is intended as an aid in the diagnosis of influenza from Nasopharyngeal swab specimens and should not be used as a sole basis for treatment. Nasal washings and aspirates are unacceptable for Xpert Xpress SARS-CoV-2/FLU/RSV testing.  Fact Sheet for Patients: EntrepreneurPulse.com.au  Fact Sheet for Healthcare Providers: IncredibleEmployment.be  This test is not yet approved or cleared by the [place] FDA and has been authorized for detection and/or diagnosis of SARS-CoV-2 by FDA under an Emergency Use Authorization (EUA). This EUA will remain in effect (meaning this test can be used) for the duration of the COVID-19 declaration under Section 564(b)(1) of the Act, 21 U.S.C. section 360bbb-3(b)(1), unless the authorization is terminated or revoked.  Performed at [facility], [address], [place], [place] [address]   Culture, blood (routine x 2)     Status: None (Preliminary result)   Collection Time: 08/03/21 11:49 AM   Specimen: Right Antecubital; Blood  Result Value Ref Range Status   Specimen Description   Final    RIGHT ANTECUBITAL BOTTLES DRAWN AEROBIC AND ANAEROBIC   Special Requests Blood Culture adequate volume  Final   Culture   Final    NO GROWTH 2 DAYS Performed at [facility], [address], [place], [place] [address]    Report Status PENDING  Incomplete  Culture, blood (routine x 2)     Status: None (Preliminary result)   Collection Time: 08/03/21 12:00 PM   Specimen: BLOOD RIGHT HAND  Result Value Ref Range Status   Specimen Description   Final    BLOOD RIGHT HAND BOTTLES DRAWN AEROBIC AND ANAEROBIC   Special Requests Blood Culture adequate volume  Final   Culture   Final    NO GROWTH 2 DAYS Performed at [facility], [address], [place], [place] [address]    Report Status  PENDING  Incomplete  Urine Culture     Status: None   Collection Time: 08/03/21  1:32 PM   Specimen: In/Out Cath Urine  Result Value Ref Range Status   Specimen Description   Final    IN/OUT CATH URINE Performed at [facility], [address], [place], [place] [address]    Special Requests   Final    NONE Performed at [facility], [address], [place], [place] [address]    Culture   Final    NO GROWTH Performed at [facility], [place] [address] [place], [place] [address]    Report Status 08/05/2021 FINAL  Final    Labs: CBC: Recent Labs  Lab 08/03/21 1144 08/04/21 0434 08/05/21 0421  WBC 24.3* 16.2* 9.9  NEUTROABS 21.2* 12.6*  --   HGB 13.2 11.1* 11.0*  HCT 38.9* 33.0* 33.4*  MCV 96.8 98.2 96.0  PLT 214 156 578   Basic Metabolic Panel: Recent Labs  Lab 08/03/21 1144 08/03/21 1145 08/04/21 0434 08/05/21 0421  NA 139  --  141 138  K 4.3  --  3.4* 3.6  CL 107  --  112* 109  CO2 24  --  23 24  GLUCOSE 214*  --  60* 98  BUN 21  --  16 14  CREATININE 1.30*  --  0.91 0.79  CALCIUM 8.5*  --  8.2* 8.3*  MG  --  1.5*  --  1.4*   Liver Function Tests: Recent Labs  Lab 08/03/21 1144 08/04/21  0434  AST 17 16  ALT 21 16  ALKPHOS 52 42  BILITOT 1.1 0.9  PROT 6.1* 5.1*  ALBUMIN 3.3* 2.6*   CBG: Recent Labs  Lab 08/04/21 0729 08/04/21 1226 08/04/21 1639 08/04/21 2151 08/05/21 0800  GLUCAP 94 110* 110* 130* 84    Discharge time spent: greater than 30 minutes.  Signed: Kathie Dike, MD [facility] 08/05/2021

## 2021-08-04 NOTE — TOC Initial Note (Addendum)
Transition of Care [facility]) - Initial/Assessment Note    Patient Details  Name: Jacob Little MRN: [id number] Date of Birth: 07-Oct-1935  Transition of Care [facility]) CM/SW Contact:    Ihor Gully, LCSW Phone Number: 08/04/2021, 2:35 PM  Clinical Narrative:                 Patient has been a resident at [facility] since 01/18/19. PT evaluation and recommendations reviewed with Tammy. Patient can return to the facility as they can manage his needs. Advised that patient may d/c today.   Update: Tammy at [facility] says that they can no longer take patient back due to his SLP recommendations for  Dysphagia 3 (Mech soft);Nectar-thick liquid   Expected Discharge Plan: Assisted Living Barriers to Discharge: Continued Medical Work up   Patient Goals and CMS Choice        Expected Discharge Plan and Services Expected Discharge Plan: Assisted Living       Living arrangements for the past 2 months: [place]                                      Prior Living Arrangements/Services Living arrangements for the past 2 months: [place] Lives with:: Facility Resident   Do you feel safe going back to the place where you live?: Yes          Current home services: DME    Activities of Daily Living Home Assistive Devices/Equipment: Environmental consultant (specify type), Wheelchair ADL Screening (condition at time of admission) Patient's cognitive ability adequate to safely complete daily activities?: No Is the patient deaf or have difficulty hearing?: No Does the patient have difficulty seeing, even when wearing glasses/contacts?: No Does the patient have difficulty concentrating, remembering, or making decisions?: Yes Patient able to express need for assistance with ADLs?: Yes Does the patient have difficulty dressing or bathing?: Yes Independently performs ADLs?: No Communication: Independent Dressing (OT): Needs assistance Is this a change from baseline?:  Pre-admission baseline Grooming: Needs assistance Is this a change from baseline?: Pre-admission baseline Feeding: Independent Bathing: Needs assistance Is this a change from baseline?: Pre-admission baseline Toileting: Needs assistance Is this a change from baseline?: Pre-admission baseline In/Out Bed: Needs assistance Is this a change from baseline?: Pre-admission baseline Walks in Home: Independent with device (comment) Does the patient have difficulty walking or climbing stairs?: No Weakness of Legs: Both Weakness of Arms/Hands: None  Permission Sought/Granted Permission sought to share information with : Facility Sport and exercise psychologist    Share Information with NAME: Tammy with [facility]           Emotional Assessment              Admission diagnosis:  Acute encephalopathy [G93.40] Sepsis, due to unspecified organism, unspecified whether acute organ dysfunction present [facility]) [A41.9] Patient Active Problem List   Diagnosis Date Noted   Acute encephalopathy 08/03/2021   Acute respiratory failure with hypoxia ([place]) 08/03/2021   Multifocal pneumonia 08/03/2021   Insulin dependent type 2 diabetes mellitus ([place]) 08/03/2021   Sepsis ([place]) 08/03/2021   Viral bronchitis 03/16/2018   Tremor of left hand 03/16/2018   Diabetes 1.5, managed as type 2 ([place]) 03/14/2018   Fever with Metabolic encephalopathy 27/74/1287   Acute metabolic encephalopathy 86/76/7209   Dehydration    Orthostatic hypotension    Generalized weakness 01/05/2018   Acute renal failure superimposed on stage 3 chronic  kidney disease ([place]) 01/05/2018   Lactic acidosis 01/05/2018   Diarrhea 01/05/2018   Vascular dementia with behavior disturbance    Acute embolic stroke ([place]) 22/44/9753   Encephalopathy 11/08/2017   Acute ischemic stroke ([place]) 11/08/2017   Essential hypertension 11/08/2017   Diabetes mellitus with hyperglycemia, without long-term current use of insulin ([place]) 11/08/2017   Acute ischemic  left MCA stroke ([place]) 11/08/2017   DENTAL PAIN 05/12/2010   CORONARY ATHEROSCLEROSIS NATIVE CORONARY ARTERY 07/22/2009   DM 07/12/2009   HLD (hyperlipidemia) 07/12/2009   HYPERTENSION 07/12/2009   ANGINA, ATYPICAL 07/12/2009   PCP:  [name], MD Pharmacy:   [facility] [place] [address] Phone: [phone] Fax: [phone]  [facility] [place] [place] [address] [place] [address] Phone: [phone] Fax: [phone]     Social Determinants of Health ([place]) Interventions    Readmission Risk Interventions No flowsheet data found.

## 2021-08-04 NOTE — Evaluation (Addendum)
Clinical/Bedside Swallow Evaluation Patient Details  Name: Jacob Little MRN: [id number] Date of Birth: June 09, 1936  Today's Date: 08/04/2021 Time: SLP Start Time (ACUTE ONLY): 1400 SLP Stop Time (ACUTE ONLY): 1426 SLP Time Calculation (min) (ACUTE ONLY): 26 min  Past Medical History:  Past Medical History:  Diagnosis Date   Atherosclerotic heart disease    Cerebral embolism with cerebral infarction ([place])    From LTC paperwork   Coronary artery disease    DES to circumflex 2006   Dementia ([place])    Encephalopathy    Essential hypertension    Hyperlipidemia    Rheumatic fever    Stroke ([place])    Type 2 diabetes mellitus ([place])    Past Surgical History:  Past Surgical History:  Procedure Laterality Date   APPENDECTOMY     CORONARY ANGIOPLASTY WITH STENT PLACEMENT     HPI:  Jacob Little is a 86 y.o. male with medical history significant of vascular dementia, hypertension, hyperlipidemia, insulin-dependent diabetes, who is a resident of assisted living facility.  Per his family, he was sent to the emergency room today when he told facility staff that he did not feel well.  Further details around his state of health and complaints are unclear.  Patient is unable to provide any further history due to dementia.  He was evaluated in the emergency room where he was noted to be mildly hypotensive, but responded to IV fluids.  Noted to have significant leukocytosis and mildly elevated lactic acid.  Chest x-ray was unremarkable, but since he had a productive cough, CT chest was obtained that showed multifocal pneumonia.  He is being admitted for further management. BSE requested    Assessment / Plan / Recommendation  Clinical Impression  Clinical swallow evaluation completed with Pt seated upright in chair. He verbalized only a few words and inconsistently followed commands related to oral motor examination. He leans to the left and required frequent repositioning to upright. Oral cavity is  clear and Pt with sparse dentition. Pt with congested baseline cough and reduced vocal intensity. Pt presents with oral phase and suspected pharyngeal phase dysphagia characterized by poor oral awareness, reduced labial seal, impaired oral manipulation, suspected delay in swallow initiation, and inconsistent coughing with thin liquids. Pt with improved performance/oral control with nectars. He benefited from liquid wash following regular textures. Recommend D3/mech soft and nectar thick liquids (NTL) with 100% supervision for all po intake and po medication whole in puree and only feed Pt when alert and upright. SLP will follow during acute stay and determine appropriate diet and if need for MBSS.  Addendum: Note that Pt may d/c today back to [facility]. If so, recommend f/u SLP services there and/or outpatient MBSS if/when appropriate.  SLP Visit Diagnosis: Dysphagia, unspecified (R13.10)    Aspiration Risk  Moderate aspiration risk    Diet Recommendation Dysphagia 3 (Mech soft);Nectar-thick liquid   Liquid Administration via: Cup;Straw Medication Administration: Whole meds with puree Supervision: Staff to assist with self feeding;Full supervision/cueing for compensatory strategies Compensations: Slow rate;Small sips/bites;Lingual sweep for clearance of pocketing;Multiple dry swallows after each bite/sip Postural Changes: Seated upright at 90 degrees;Remain upright for at least 30 minutes after po intake    Other  Recommendations Oral Care Recommendations: Oral care BID;Staff/trained caregiver to provide oral care Other Recommendations: Order thickener from pharmacy;Prohibited food (jello, ice cream, thin soups);Clarify dietary restrictions    Recommendations for follow up therapy are one component of a multi-disciplinary discharge planning process, led by the attending  physician.  Recommendations may be updated based on patient status, additional functional criteria and insurance  authorization.  Follow up Recommendations Skilled nursing-short term rehab (<3 hours/day)      Assistance Recommended at Discharge Frequent or constant Supervision/Assistance  Functional Status Assessment Patient has had a recent decline in their functional status and demonstrates the ability to make significant improvements in function in a reasonable and predictable amount of time.  Frequency and Duration min 2x/week  1 week       Prognosis Prognosis for Safe Diet Advancement: Fair Barriers to Reach Goals: Severity of deficits      Swallow Study   General Date of Onset: 08/04/21 HPI: Jacob Little is a 86 y.o. male with medical history significant of vascular dementia, hypertension, hyperlipidemia, insulin-dependent diabetes, who is a resident of assisted living facility.  Per his family, he was sent to the emergency room today when he told facility staff that he did not feel well.  Further details around his state of health and complaints are unclear.  Patient is unable to provide any further history due to dementia.  He was evaluated in the emergency room where he was noted to be mildly hypotensive, but responded to IV fluids.  Noted to have significant leukocytosis and mildly elevated lactic acid.  Chest x-ray was unremarkable, but since he had a productive cough, CT chest was obtained that showed multifocal pneumonia.  He is being admitted for further management. BSE requested Type of Study: Bedside Swallow Evaluation Previous Swallow Assessment: BSE 2019 reg/thin Diet Prior to this Study: Regular;Thin liquids Temperature Spikes Noted: No Respiratory Status: Room air History of Recent Intubation: No Behavior/Cognition: Alert;Doesn't follow directions;Lethargic/Drowsy Oral Cavity Assessment: Within Functional Limits Oral Care Completed by SLP: Recent completion by staff Oral Cavity - Dentition: Poor condition;Missing dentition Vision: Functional for self-feeding Self-Feeding  Abilities: Needs set up;Needs assist Patient Positioning: Upright in chair Baseline Vocal Quality: Low vocal intensity Volitional Cough: Congested;Weak Volitional Swallow: Unable to elicit    Oral/Motor/Sensory Function Overall Oral Motor/Sensory Function: Generalized oral weakness   Ice Chips Ice chips: Impaired Presentation: Spoon Oral Phase Impairments: Reduced labial seal;Poor awareness of bolus   Thin Liquid Thin Liquid: Impaired Presentation: Cup;Straw Oral Phase Impairments: Reduced labial seal;Reduced lingual movement/coordination;Poor awareness of bolus Pharyngeal  Phase Impairments: Suspected delayed Swallow;Cough - Immediate    Nectar Thick Nectar Thick Liquid: Impaired Presentation: Cup;Straw Oral Phase Impairments: Reduced labial seal;Reduced lingual movement/coordination Pharyngeal Phase Impairments: Suspected delayed Swallow   Honey Thick Honey Thick Liquid: Not tested   Puree Puree: Within functional limits Presentation: Spoon   Solid     Solid: Impaired Oral Phase Impairments: Impaired mastication Oral Phase Functional Implications: Prolonged oral transit     Thank you,  Genene Churn, Portal 08/04/2021,2:41 PM

## 2021-08-05 ENCOUNTER — Inpatient Hospital Stay (HOSPITAL_COMMUNITY): Payer: PPO

## 2021-08-05 LAB — BASIC METABOLIC PANEL
Anion gap: 5 (ref 5–15)
BUN: 14 mg/dL (ref 8–23)
CO2: 24 mmol/L (ref 22–32)
Calcium: 8.3 mg/dL — ABNORMAL LOW (ref 8.9–10.3)
Chloride: 109 mmol/L (ref 98–111)
Creatinine, Ser: 0.79 mg/dL (ref 0.61–1.24)
GFR, Estimated: >60 mL/min (ref >60)
Glucose, Bld: 98 mg/dL (ref 70–99)
Potassium: 3.6 mmol/L (ref 3.5–5.1)
Sodium: 138 mmol/L (ref 135–145)

## 2021-08-05 LAB — CBC
HCT: 33.4 % — ABNORMAL LOW (ref 39.0–52.0)
Hemoglobin: 11 g/dL — ABNORMAL LOW (ref 13.0–17.0)
MCH: 31.6 pg (ref 26.0–34.0)
MCHC: 32.9 g/dL (ref 30.0–36.0)
MCV: 96 fL (ref 80.0–100.0)
Platelets: 166 10*3/uL (ref 150–400)
RBC: 3.48 MIL/uL — ABNORMAL LOW (ref 4.22–5.81)
RDW: 13 % (ref 11.5–15.5)
WBC: 9.9 10*3/uL (ref 4.0–10.5)
nRBC: 0 % (ref 0.0–0.2)

## 2021-08-05 LAB — URINE CULTURE: Culture: NO GROWTH

## 2021-08-05 LAB — GLUCOSE, CAPILLARY
Glucose-Capillary: 84 mg/dL (ref 70–99)
Glucose-Capillary: 114 mg/dL — ABNORMAL HIGH (ref 70–99)

## 2021-08-05 LAB — MAGNESIUM: Magnesium: 1.4 mg/dL — ABNORMAL LOW (ref 1.7–2.4)

## 2021-08-05 LAB — LEGIONELLA PNEUMOPHILA SEROGP 1 UR AG: L. pneumophila Serogp 1 Ur Ag: NEGATIVE

## 2021-08-05 MED ORDER — MAGNESIUM SULFATE 4 GM/100ML IV SOLN
4.0000 g | Freq: Once | INTRAVENOUS | Status: AC
Start: 2021-08-05 — End: 2021-08-05
  Administered 2021-08-05: 4 g via INTRAVENOUS
  Filled 2021-08-05: qty 100

## 2021-08-05 NOTE — Evaluation (Signed)
Modified Barium Swallow Progress Note  Patient Details  Name: Jacob Little MRN: [id number] Date of Birth: 1935-08-23  Today's Date: 08/05/2021  Modified Barium Swallow completed.  Full report located under Chart Review in the Imaging Section.  Brief recommendations include the following:  Clinical Impression  Pt presents with moderate primary cognitive based oral dysphagia and mild pharyngeal dysphagia. Pt was provided sedating medication this am ~4 hrs prior to the study which likely contributed to his lethargic presentation; Pt required frequent tactile and verbal cues to maintain appropriate positioning and alertness. Despite lethargy and sub-optimal positioning, no penetration or aspiration was visualized during this study. Oral phase is characterized by lingual pumping and prolonged AP transit, further note mild oral residue after the initial swallow. Pharyngeal phase is characterized by good laryngeal vestibule closure, good hyolaryngeal excursion and fully intact pharyngeal stripping wave, however occasional trace to min pharyngeal residue is noted after initial swallow and cleared with second swallow cued& reflexive. Pt was presented with barium tablet with puree and despite multiple presentations of thin and puree was unable to swallow tablet and it was ultimately expectorated; suspect inability to coordinate swallow of tablet was related to current mentation. Recommend upgrade Pt's diet to regular with thin liquids. Please ensure Pt is alert and responsive and sitting upright for all PO. Recommend meds be administered crushed with puree. There are no further ST needs noted at this time. Results reviewed with RN and MD.   Swallow Evaluation Recommendations       SLP Diet Recommendations: Regular solids;Thin liquid   Liquid Administration via: Cup;No straw   Medication Administration: Crushed with puree   Supervision: Full assist for feeding;Full supervision/cueing for compensatory  strategies   Compensations: Slow rate;Small sips/bites;Lingual sweep for clearance of pocketing;Multiple dry swallows after each bite/sip   Postural Changes: Seated upright at 90 degrees   Oral Care Recommendations: Oral care BID      Jacob Little, [place] Speech Language Pathologist   Wende Bushy 08/05/2021,10:56 AM

## 2021-08-05 NOTE — Progress Notes (Signed)
Patient anxious, pulling off condom catheter and banging his leg against bed rail causing an abrasion to his right knee. Right knee was cleansed with soap and water and pink foam was placed for protection.

## 2021-08-05 NOTE — TOC Transition Note (Signed)
Transition of Care [facility]) - CM/SW Discharge Note   Patient Details  Name: Jacob Little MRN: [id number] Date of Birth: April 09, 1936  Transition of Care [facility]) CM/SW Contact:  Ihor Gully, LCSW Phone Number: 08/05/2021, 1:19 PM   Clinical Narrative:    Patient passed MBSS and can return to [facility]. Discharge clinicals sent to facility. Daughter notified of d/c. Facility to transport patient.    Final next level of care: Assisted Living Barriers to Discharge: No Barriers Identified   Patient Goals and CMS Choice        Discharge Placement                       Discharge Plan and Services                                     Social Determinants of Health (SDOH) Interventions     Readmission Risk Interventions Readmission Risk Prevention Plan 08/05/2021  Transportation Screening Complete  PCP or Specialist Appt within 5-7 Days Complete  Home Care Screening Complete  Medication Review (RN CM) Complete  Some recent data might be hidden

## 2021-08-05 NOTE — NC FL2 (Signed)
[place] MEDICAID FL2 LEVEL OF CARE SCREENING TOOL     IDENTIFICATION  Patient Name: Jacob Little Birthdate: 1935/08/03 Sex: male Admission Date (Current Location): 08/03/2021  [facility] and [place] Number:  [employer] and Address:  [facility] [address] [place], [place] [address]      Provider Number: [id number]  Attending Physician Name and Address:  Kathie Dike, MD  Relative Name and Phone Number:  Shann Medal (Daughter)   [phone]    Current Level of Care: Hospital (observation) Recommended Level of Care: [place] Prior Approval Number:    Date Approved/Denied:   PASRR Number:    Discharge Plan: Domiciliary (Rest home)    Current Diagnoses: Patient Active Problem List   Diagnosis Date Noted   Acute encephalopathy 08/03/2021   Acute respiratory failure with hypoxia ([place]) 08/03/2021   Multifocal pneumonia 08/03/2021   Insulin dependent type 2 diabetes mellitus ([place]) 08/03/2021   Sepsis ([place]) 08/03/2021   Viral bronchitis 03/16/2018   Tremor of left hand 03/16/2018   Diabetes 1.5, managed as type 2 ([place]) 03/14/2018   Fever with Metabolic encephalopathy 78/46/9629   Acute metabolic encephalopathy 52/84/1324   Dehydration    Orthostatic hypotension    Generalized weakness 01/05/2018   Acute renal failure superimposed on stage 3 chronic kidney disease ([place]) 01/05/2018   Lactic acidosis 01/05/2018   Diarrhea 01/05/2018   Vascular dementia with behavior disturbance    Acute embolic stroke ([place]) 40/03/2724   Encephalopathy 11/08/2017   Acute ischemic stroke ([place]) 11/08/2017   Essential hypertension 11/08/2017   Diabetes mellitus with hyperglycemia, without long-term current use of insulin ([place]) 11/08/2017   Acute ischemic left MCA stroke ([facility]) 11/08/2017   DENTAL PAIN 05/12/2010   CORONARY ATHEROSCLEROSIS NATIVE CORONARY ARTERY 07/22/2009   DM 07/12/2009   HLD (hyperlipidemia) 07/12/2009    HYPERTENSION 07/12/2009   ANGINA, ATYPICAL 07/12/2009    Orientation RESPIRATION BLADDER Height & Weight     Self  Normal Incontinent Weight: 145 lb 8.1 oz (66 kg) Height:  5\' 8"  (172.7 cm)  BEHAVIORAL SYMPTOMS/MOOD NEUROLOGICAL BOWEL NUTRITION STATUS      Incontinent Diet (low sodium heart health which facility says equates to their no added table salt regular diet.)  AMBULATORY STATUS COMMUNICATION OF NEEDS Skin   Limited Assist Verbally Normal                       Personal Care Assistance Level of Assistance  Bathing, Feeding, Dressing Bathing Assistance: Limited assistance Feeding assistance: Independent Dressing Assistance: Limited assistance     Functional Limitations Info  Sight, Speech, Hearing Sight Info: Adequate Hearing Info: Adequate Speech Info: Adequate    SPECIAL CARE FACTORS FREQUENCY  PT (By licensed PT), Speech therapy     PT Frequency: 3x/week       Speech Therapy Frequency: 3x/week      Contractures      Additional Factors Info  Code Status, Allergies, Psychotropic Code Status Info: Full Code Allergies Info: NKA Psychotropic Info: Risperdal         Current Medications (08/05/2021):  This is the current hospital active medication list Current Facility-Administered Medications  Medication Dose Route Frequency Provider Last Rate Last Admin   0.9 %  sodium chloride infusion   Intravenous PRN Kathie Dike, MD       acetaminophen (TYLENOL) tablet 650 mg  650 mg Oral Q6H PRN Kathie Dike, MD   650 mg at 08/04/21 1921  Or   acetaminophen (TYLENOL) suppository 650 mg  650 mg Rectal Q6H PRN Kathie Dike, MD       aspirin EC tablet 81 mg  81 mg Oral Daily Kathie Dike, MD   81 mg at 08/05/21 3903   atorvastatin (LIPITOR) tablet 20 mg  20 mg Oral q1800 Kathie Dike, MD   20 mg at 08/04/21 1710   azithromycin (ZITHROMAX) 500 mg in sodium chloride 0.9 % 250 mL IVPB  500 mg Intravenous Q24H Kathie Dike, MD 250 mL/hr at 08/04/21  1803 500 mg at 08/04/21 1803   cefTRIAXone (ROCEPHIN) 2 g in sodium chloride 0.9 % 100 mL IVPB  2 g Intravenous Q24H Kathie Dike, MD 200 mL/hr at 08/04/21 1713 2 g at 08/04/21 1713   enoxaparin (LOVENOX) injection 40 mg  40 mg Subcutaneous Q24H Kathie Dike, MD   40 mg at 08/04/21 2229   guaiFENesin (MUCINEX) 12 hr tablet 600 mg  600 mg Oral BID Kathie Dike, MD   600 mg at 08/05/21 0092   insulin aspart (novoLOG) injection 0-5 Units  0-5 Units Subcutaneous QHS Memon, Jolaine Artist, MD       insulin aspart (novoLOG) injection 0-9 Units  0-9 Units Subcutaneous TID WC Memon, Jolaine Artist, MD       ipratropium-albuterol (DUONEB) 0.5-2.5 (3) MG/3ML nebulizer solution 3 mL  3 mL Nebulization BID Kathie Dike, MD   3 mL at 08/05/21 0846   LORazepam (ATIVAN) injection 1-2 mg  1-2 mg Intravenous Q4H PRN Kathie Dike, MD   1 mg at 08/05/21 0521   metoprolol tartrate (LOPRESSOR) tablet 12.5 mg  12.5 mg Oral BID Kathie Dike, MD   12.5 mg at 08/05/21 0822   ondansetron (ZOFRAN) tablet 4 mg  4 mg Oral Q6H PRN Kathie Dike, MD       Or   ondansetron (ZOFRAN) injection 4 mg  4 mg Intravenous Q6H PRN Kathie Dike, MD       QUEtiapine (SEROQUEL) tablet 25 mg  25 mg Oral QHS Zierle-Ghosh, Asia B, DO   25 mg at 08/04/21 2354   risperiDONE (RISPERDAL) tablet 0.5 mg  0.5 mg Oral BID Kathie Dike, MD   0.5 mg at 08/05/21 3300     Discharge Medications:  TAKE these medications     acetaminophen 325 MG tablet Commonly known as: TYLENOL Take 2 tablets (650 mg total) by mouth every 4 (four) hours as needed for mild pain (or temp > 37.5 C (99.5 F)).    albuterol 108 (90 Base) MCG/ACT inhaler Commonly known as: VENTOLIN HFA Inhale 2 puffs into the lungs every 6 (six) hours as needed for wheezing or shortness of breath.    Aspirin Low Dose 81 MG EC tablet Generic drug: aspirin TAKE (1) TABLET BY MOUTH ONCE DAILY. What changed: See the new instructions.    atorvastatin 20 MG tablet Commonly  known as: LIPITOR Take 20 mg by mouth daily.    azithromycin 500 MG tablet Commonly known as: Zithromax Take 1 tablet (500 mg total) by mouth daily for 5 days. Take 1 tablet daily for 3 days.    benzonatate 100 MG capsule Commonly known as: TESSALON Take 1 capsule (100 mg total) by mouth every 8 (eight) hours.    cefadroxil 500 MG capsule Commonly known as: DURICEF Take 1 capsule (500 mg total) by mouth 2 (two) times daily for 7 days.    cyanocobalamin 1000 MCG/ML injection Commonly known as: (VITAMIN B-12) Inject 1,000 mcg into the muscle every 30 (thirty) days.  glimepiride 1 MG tablet Commonly known as: Amaryl Take 1 tablet (1 mg total) by mouth every morning.    guaiFENesin 600 MG 12 hr tablet Commonly known as: MUCINEX Take 1 tablet (600 mg total) by mouth 2 (two) times daily.    insulin glargine 100 UNIT/ML injection Commonly known as: LANTUS Inject 22 Units into the skin daily. Hold if BS<60. Notify MD if BS>400    ipratropium-albuterol 0.5-2.5 (3) MG/3ML Soln Commonly known as: DUONEB Take 3 mLs by nebulization 2 (two) times daily.    isosorbide mononitrate 30 MG 24 hr tablet Commonly known as: IMDUR Take 0.5 tablets (15 mg total) by mouth 2 (two) times daily.    Januvia 100 MG tablet Generic drug: sitaGLIPtin Take 1 tablet by mouth daily.    metoprolol tartrate 25 MG tablet Commonly known as: LOPRESSOR Take 0.5 tablets (12.5 mg total) by mouth 2 (two) times daily.    nitroGLYCERIN 0.4 MG SL tablet Commonly known as: NITROSTAT Place 0.4 mg under the tongue every 5 (five) minutes as needed for chest pain.    ondansetron 8 MG disintegrating tablet Commonly known as: Zofran ODT Take 1 tablet (8 mg total) by mouth every 8 (eight) hours as needed for nausea.    risperiDONE 0.5 MG tablet Commonly known as: RISPERDAL Take 0.5 mg by mouth 2 (two) times daily. What changed: Another medication with the same name was removed. Continue taking this medication,  and follow the directions you see here.    TUSSIN DM PO Take 5 mLs by mouth every 6 (six) hours as needed (cough/congestion).          Relevant Imaging Results:  Relevant Lab Results:   Additional Information SSN: [id number]  Ihor Gully, LCSW

## 2021-08-07 DIAGNOSIS — A419 Sepsis, unspecified organism: Secondary | ICD-10-CM | POA: Diagnosis not present

## 2021-08-07 DIAGNOSIS — J180 Bronchopneumonia, unspecified organism: Secondary | ICD-10-CM | POA: Diagnosis not present

## 2021-08-08 LAB — CULTURE, BLOOD (ROUTINE X 2)
Culture: NO GROWTH
Culture: NO GROWTH
Special Requests: ADEQUATE
Special Requests: ADEQUATE

## 2021-08-12 ENCOUNTER — Encounter (HOSPITAL_COMMUNITY): Payer: Self-pay

## 2021-08-12 ENCOUNTER — Other Ambulatory Visit: Payer: Self-pay

## 2021-08-12 ENCOUNTER — Observation Stay (HOSPITAL_COMMUNITY)
Admission: EM | Admit: 2021-08-12 | Discharge: 2021-08-14 | Disposition: A | Discharge status: Skilled Nursing Facility | Payer: PPO | Attending: Family Medicine | Admitting: Family Medicine

## 2021-08-12 ENCOUNTER — Emergency Department (HOSPITAL_COMMUNITY): Payer: PPO

## 2021-08-12 DIAGNOSIS — Z8616 Personal history of COVID-19: Secondary | ICD-10-CM | POA: Diagnosis not present

## 2021-08-12 DIAGNOSIS — Z20822 Contact with and (suspected) exposure to covid-19: Secondary | ICD-10-CM | POA: Diagnosis not present

## 2021-08-12 DIAGNOSIS — I4510 Unspecified right bundle-branch block: Secondary | ICD-10-CM | POA: Diagnosis not present

## 2021-08-12 DIAGNOSIS — G9340 Encephalopathy, unspecified: Principal | ICD-10-CM | POA: Insufficient documentation

## 2021-08-12 DIAGNOSIS — E119 Type 2 diabetes mellitus without complications: Secondary | ICD-10-CM | POA: Insufficient documentation

## 2021-08-12 DIAGNOSIS — F01518 Vascular dementia, unspecified severity, with other behavioral disturbance: Secondary | ICD-10-CM

## 2021-08-12 DIAGNOSIS — Z87891 Personal history of nicotine dependence: Secondary | ICD-10-CM | POA: Diagnosis not present

## 2021-08-12 DIAGNOSIS — Z8673 Personal history of transient ischemic attack (TIA), and cerebral infarction without residual deficits: Secondary | ICD-10-CM | POA: Insufficient documentation

## 2021-08-12 DIAGNOSIS — I493 Ventricular premature depolarization: Secondary | ICD-10-CM | POA: Diagnosis not present

## 2021-08-12 DIAGNOSIS — F0390 Unspecified dementia without behavioral disturbance: Secondary | ICD-10-CM | POA: Diagnosis not present

## 2021-08-12 DIAGNOSIS — R1310 Dysphagia, unspecified: Secondary | ICD-10-CM | POA: Diagnosis not present

## 2021-08-12 DIAGNOSIS — A419 Sepsis, unspecified organism: Secondary | ICD-10-CM | POA: Diagnosis not present

## 2021-08-12 DIAGNOSIS — Z794 Long term (current) use of insulin: Secondary | ICD-10-CM | POA: Diagnosis not present

## 2021-08-12 DIAGNOSIS — T68XXXA Hypothermia, initial encounter: Secondary | ICD-10-CM | POA: Diagnosis not present

## 2021-08-12 DIAGNOSIS — Z7982 Long term (current) use of aspirin: Secondary | ICD-10-CM | POA: Insufficient documentation

## 2021-08-12 DIAGNOSIS — Z79899 Other long term (current) drug therapy: Secondary | ICD-10-CM | POA: Diagnosis not present

## 2021-08-12 DIAGNOSIS — R4182 Altered mental status, unspecified: Secondary | ICD-10-CM | POA: Diagnosis not present

## 2021-08-12 DIAGNOSIS — I10 Essential (primary) hypertension: Secondary | ICD-10-CM | POA: Diagnosis not present

## 2021-08-12 LAB — BLOOD GAS, VENOUS
Acid-Base Excess: 1.7 mmol/L (ref 0.0–2.0)
Bicarbonate: 25.8 mmol/L (ref 20.0–28.0)
Drawn by: 6352
FIO2: 21 %
O2 Saturation: 95.3 %
Patient temperature: 34.9
pCO2, Ven: 35 mmHg — ABNORMAL LOW (ref 44–60)
pH, Ven: 7.47 — ABNORMAL HIGH (ref 7.25–7.43)
pO2, Ven: 62 mmHg — ABNORMAL HIGH (ref 32–45)

## 2021-08-12 LAB — COMPREHENSIVE METABOLIC PANEL WITH GFR
ALT: 38 U/L (ref 0–44)
AST: 27 U/L (ref 15–41)
Albumin: 3.3 g/dL — ABNORMAL LOW (ref 3.5–5.0)
Alkaline Phosphatase: 51 U/L (ref 38–126)
Anion gap: 7 (ref 5–15)
BUN: 19 mg/dL (ref 8–23)
CO2: 22 mmol/L (ref 22–32)
Calcium: 8.6 mg/dL — ABNORMAL LOW (ref 8.9–10.3)
Chloride: 108 mmol/L (ref 98–111)
Creatinine, Ser: 0.92 mg/dL (ref 0.61–1.24)
GFR, Estimated: >60 mL/min
Glucose, Bld: 104 mg/dL — ABNORMAL HIGH (ref 70–99)
Potassium: 3.7 mmol/L (ref 3.5–5.1)
Sodium: 137 mmol/L (ref 135–145)
Total Bilirubin: 0.8 mg/dL (ref 0.3–1.2)
Total Protein: 6.3 g/dL — ABNORMAL LOW (ref 6.5–8.1)

## 2021-08-12 LAB — CBG MONITORING, ED
Glucose-Capillary: 175 mg/dL — ABNORMAL HIGH (ref 70–99)
Glucose-Capillary: 71 mg/dL (ref 70–99)

## 2021-08-12 LAB — CBC WITH DIFFERENTIAL/PLATELET
Abs Immature Granulocytes: 0.13 10*3/uL — ABNORMAL HIGH (ref 0.00–0.07)
Basophils Absolute: 0 10*3/uL (ref 0.0–0.1)
Basophils Relative: 0 %
Eosinophils Absolute: 0.1 10*3/uL (ref 0.0–0.5)
Eosinophils Relative: 1 %
HCT: 36.5 % — ABNORMAL LOW (ref 39.0–52.0)
Hemoglobin: 12.3 g/dL — ABNORMAL LOW (ref 13.0–17.0)
Immature Granulocytes: 1 %
Lymphocytes Relative: 14 %
Lymphs Abs: 1.2 10*3/uL (ref 0.7–4.0)
MCH: 31.9 pg (ref 26.0–34.0)
MCHC: 33.7 g/dL (ref 30.0–36.0)
MCV: 94.6 fL (ref 80.0–100.0)
Monocytes Absolute: 0.8 10*3/uL (ref 0.1–1.0)
Monocytes Relative: 9 %
Neutro Abs: 6.7 10*3/uL (ref 1.7–7.7)
Neutrophils Relative %: 75 %
Platelets: 220 10*3/uL (ref 150–400)
RBC: 3.86 MIL/uL — ABNORMAL LOW (ref 4.22–5.81)
RDW: 12.7 % (ref 11.5–15.5)
WBC: 9.1 10*3/uL (ref 4.0–10.5)
nRBC: 0 % (ref 0.0–0.2)

## 2021-08-12 LAB — URINALYSIS, ROUTINE W REFLEX MICROSCOPIC
Bilirubin Urine: NEGATIVE
Glucose, UA: 50 mg/dL — AB
Hgb urine dipstick: NEGATIVE
Ketones, ur: 5 mg/dL — AB
Leukocytes,Ua: NEGATIVE
Nitrite: NEGATIVE
Protein, ur: NEGATIVE mg/dL
Specific Gravity, Urine: 1.027 (ref 1.005–1.030)
pH: 5 (ref 5.0–8.0)

## 2021-08-12 LAB — PROTIME-INR
INR: 1.2 (ref 0.8–1.2)
Prothrombin Time: 14.8 s (ref 11.4–15.2)

## 2021-08-12 LAB — APTT: aPTT: 28 s (ref 24–36)

## 2021-08-12 LAB — AMMONIA: Ammonia: 21 umol/L (ref 9–35)

## 2021-08-12 LAB — LACTIC ACID, PLASMA: Lactic Acid, Venous: 0.8 mmol/L (ref 0.5–1.9)

## 2021-08-12 LAB — TSH: TSH: 1.757 u[IU]/mL (ref 0.350–4.500)

## 2021-08-12 MED ORDER — ACETAMINOPHEN 650 MG RE SUPP: 650.0000 mg | Freq: Four times a day (QID) | RECTAL | Status: DC | PRN

## 2021-08-12 MED ORDER — ISOSORBIDE MONONITRATE ER 30 MG PO TB24
15.0000 mg | ORAL_TABLET | Freq: Two times a day (BID) | ORAL | Status: DC
  Administered 2021-08-13 – 2021-08-14 (×3): 15 mg via ORAL
  Filled 2021-08-12 (×3): qty 1

## 2021-08-12 MED ORDER — ATORVASTATIN CALCIUM 10 MG PO TABS
20.0000 mg | ORAL_TABLET | Freq: Every day | ORAL | Status: DC
Start: 2021-08-13 — End: 2021-08-14
  Administered 2021-08-13 – 2021-08-14 (×2): 20 mg via ORAL
  Filled 2021-08-12 (×2): qty 2

## 2021-08-12 MED ORDER — ONDANSETRON HCL 4 MG/2ML IJ SOLN: 4.0000 mg | Freq: Four times a day (QID) | INTRAMUSCULAR | Status: DC | PRN

## 2021-08-12 MED ORDER — ALBUTEROL SULFATE HFA 108 (90 BASE) MCG/ACT IN AERS: 2.0000 | INHALATION_SPRAY | Freq: Four times a day (QID) | RESPIRATORY_TRACT | Status: DC | PRN

## 2021-08-12 MED ORDER — INSULIN ASPART 100 UNIT/ML IJ SOLN: 0.0000 [IU] | Freq: Three times a day (TID) | INTRAMUSCULAR | Status: DC

## 2021-08-12 MED ORDER — METOPROLOL TARTRATE 25 MG PO TABS
12.5000 mg | ORAL_TABLET | Freq: Two times a day (BID) | ORAL | Status: DC
  Administered 2021-08-13 – 2021-08-14 (×3): 12.5 mg via ORAL
  Filled 2021-08-12 (×3): qty 1

## 2021-08-12 MED ORDER — ONDANSETRON HCL 4 MG PO TABS: 4.0000 mg | ORAL_TABLET | Freq: Four times a day (QID) | ORAL | Status: DC | PRN

## 2021-08-12 MED ORDER — RISPERIDONE 0.5 MG PO TABS
0.5000 mg | ORAL_TABLET | Freq: Two times a day (BID) | ORAL | Status: DC
Start: 2021-08-12 — End: 2021-08-14
  Administered 2021-08-13 – 2021-08-14 (×4): 0.5 mg via ORAL
  Filled 2021-08-12 (×4): qty 1

## 2021-08-12 MED ORDER — ENOXAPARIN SODIUM 40 MG/0.4ML IJ SOSY
40.0000 mg | PREFILLED_SYRINGE | INTRAMUSCULAR | Status: DC
  Administered 2021-08-13 – 2021-08-14 (×2): 40 mg via SUBCUTANEOUS
  Filled 2021-08-12 (×2): qty 0.4

## 2021-08-12 MED ORDER — ASPIRIN EC 81 MG PO TBEC
81.0000 mg | DELAYED_RELEASE_TABLET | Freq: Every day | ORAL | Status: DC
  Administered 2021-08-13 – 2021-08-14 (×2): 81 mg via ORAL
  Filled 2021-08-12 (×2): qty 1

## 2021-08-12 MED ORDER — SODIUM CHLORIDE 0.9% FLUSH
3.0000 mL | Freq: Two times a day (BID) | INTRAVENOUS | Status: DC
  Administered 2021-08-13 (×2): 3 mL via INTRAVENOUS

## 2021-08-12 MED ORDER — ACETAMINOPHEN 325 MG PO TABS: 650.0000 mg | ORAL_TABLET | Freq: Four times a day (QID) | ORAL | Status: DC | PRN

## 2021-08-12 MED ORDER — SENNOSIDES-DOCUSATE SODIUM 8.6-50 MG PO TABS: 1.0000 | ORAL_TABLET | Freq: Every evening | ORAL | Status: DC | PRN

## 2021-08-12 NOTE — H&P (Signed)
History and Physical    Jacob Little LEX:[id number] DOB: 10-Mar-1936 DOA: 08/12/2021  PCP: [name], MD   Patient coming from: home   Chief Complaint: Decreased LOC   HPI: Jacob Little is a pleasant 86 y.o. male with medical history significant for history of CVA, dementia, type 2 diabetes mellitus, and recent admission with pneumonia, now presenting from his nursing facility for evaluation of decreased level of consciousness.  The patient was reportedly in his usual state today until this afternoon when he was noted to be lethargic, not speaking, and then poorly responsive.  EMS was called and patient was found to be hypoglycemic.  He was treated with IV dextrose and brought into the ED.  At his baseline, patient is reportedly ambulatory, interacts verbally, but often agitated.  ED Course: Upon arrival to the ED, patient is found to have a rectal temperature of 34.9 C and mild bradycardia with stable blood pressure.  EKG features sinus rhythm with PVC, RBBB, and LAFB.  No acute findings on head CT or chest x-ray.  Chemistry panel and CBC unremarkable, TSH normal, and ammonia normal.  Warming blankets were applied briefly.  Patient has been improving but is still not back to baseline per his daughter.   Review of Systems:  Unable to complete ROS secondary the patient's clinical condition.  Past Medical History:  Diagnosis Date   Atherosclerotic heart disease    Cerebral embolism with cerebral infarction [facility])    From LTC paperwork   Coronary artery disease    DES to circumflex 2006   Dementia ([place])    Encephalopathy    Essential hypertension    Hyperlipidemia    Rheumatic fever    Stroke ([place])    Type 2 diabetes mellitus ([place])     Past Surgical History:  Procedure Laterality Date   APPENDECTOMY     CORONARY ANGIOPLASTY WITH STENT PLACEMENT      Social History:   reports that he quit smoking about 16 years ago. His smoking use included cigarettes. He has never used  smokeless tobacco. He reports that he does not drink alcohol and does not use drugs.  No Known Allergies  History reviewed. No pertinent family history.   Prior to Admission medications   Medication Sig Start Date End Date Taking? Authorizing Provider  acetaminophen (TYLENOL) 325 MG tablet Take 2 tablets (650 mg total) by mouth every 4 (four) hours as needed for mild pain (or temp > 37.5 C (99.5 F)). 11/12/17   Barton Dubois, MD  albuterol (PROVENTIL HFA;VENTOLIN HFA) 108 (90 Base) MCG/ACT inhaler Inhale 2 puffs into the lungs every 6 (six) hours as needed for wheezing or shortness of breath. 03/16/18   Kathie Dike, MD  ASPIRIN LOW DOSE 81 MG EC tablet TAKE (1) TABLET BY MOUTH ONCE DAILY. Patient taking differently: Take 81 mg by mouth daily. 09/20/20   Satira Sark, MD  atorvastatin (LIPITOR) 20 MG tablet Take 20 mg by mouth daily.    [provider]  benzonatate (TESSALON) 100 MG capsule Take 1 capsule (100 mg total) by mouth every 8 (eight) hours. 02/05/21   Smoot, Sarah A, PA-C  cyanocobalamin (,VITAMIN B-12,) 1000 MCG/ML injection Inject 1,000 mcg into the muscle every 30 (thirty) days.  09/14/17   [provider]  Dextromethorphan-guaiFENesin (TUSSIN DM PO) Take 5 mLs by mouth every 6 (six) hours as needed (cough/congestion).    [provider]  glimepiride (AMARYL) 1 MG tablet Take 1 tablet (1 mg total)  by mouth every morning. 01/07/18 10/04/19  Barton Dubois, MD  guaiFENesin (MUCINEX) 600 MG 12 hr tablet Take 1 tablet (600 mg total) by mouth 2 (two) times daily. 08/04/21   Kathie Dike, MD  insulin glargine (LANTUS) 100 UNIT/ML injection Inject 22 Units into the skin daily. Hold if BS<60. Notify MD if BS>400    [provider]  ipratropium-albuterol (DUONEB) 0.5-2.5 (3) MG/3ML SOLN Take 3 mLs by nebulization 2 (two) times daily. 08/04/21   Kathie Dike, MD  isosorbide mononitrate (IMDUR) 30 MG 24 hr tablet Take 0.5 tablets (15 mg total) by mouth  2 (two) times daily. 01/07/18 02/05/21  Barton Dubois, MD  JANUVIA 100 MG tablet Take 1 tablet by mouth daily. 11/04/18   [provider]  metoprolol tartrate (LOPRESSOR) 25 MG tablet Take 0.5 tablets (12.5 mg total) by mouth 2 (two) times daily. 11/12/17   Barton Dubois, MD  nitroGLYCERIN (NITROSTAT) 0.4 MG SL tablet Place 0.4 mg under the tongue every 5 (five) minutes as needed for chest pain.    [provider]  ondansetron (ZOFRAN ODT) 8 MG disintegrating tablet Take 1 tablet (8 mg total) by mouth every 8 (eight) hours as needed for nausea. Patient not taking: No sig reported 02/24/19   Varney Biles, MD  risperiDONE (RISPERDAL) 0.5 MG tablet Take 0.5 mg by mouth 2 (two) times daily. 07/25/21   [provider]    Physical Exam: Vitals:   08/12/21 2030 08/12/21 2110 08/12/21 2118 08/12/21 2130  BP: (!) 118/58 (!) 130/54  137/70  Pulse: (!) 51 (!) 55  61  Resp: 18 18  18   Temp:   97.6 F (36.4 C)   TempSrc:   Rectal   SpO2: 95% 95%  98%  Weight:      Height:        Constitutional: NAD, calm  Eyes: PERTLA, lids and conjunctivae normal ENMT: Mucous membranes are moist. Posterior pharynx clear of any exudate or lesions.   Neck: supple, no masses  Respiratory:  no wheezing, no crackles. No accessory muscle use.  Cardiovascular: S1 & S2 heard, regular rate and rhythm. No extremity edema.   Abdomen: No distension, no tenderness, soft. Bowel sounds active.  Musculoskeletal: no clubbing / cyanosis. No joint deformity upper and lower extremities.   Skin: no significant rashes, lesions, ulcers. Warm, dry, well-perfused. Neurologic: CN 2-12 grossly intact. Moving all extremities. Alert and oriented to person only.  Psychiatric: Calm. Cooperative.    Labs and Imaging on Admission: I have personally reviewed following labs and imaging studies  CBC: Recent Labs  Lab 08/12/21 1922  WBC 9.1  NEUTROABS 6.7  HGB 12.3*  HCT 36.5*  MCV 94.6  PLT 852   Basic  Metabolic Panel: Recent Labs  Lab 08/12/21 1922  NA 137  K 3.7  CL 108  CO2 22  GLUCOSE 104*  BUN 19  CREATININE 0.92  CALCIUM 8.6*   GFR: Estimated Creatinine Clearance: 56.8 mL/min (by C-G formula based on SCr of 0.92 mg/dL). Liver Function Tests: Recent Labs  Lab 08/12/21 1922  AST 27  ALT 38  ALKPHOS 51  BILITOT 0.8  PROT 6.3*  ALBUMIN 3.3*   No results for input(s): LIPASE, AMYLASE in the last 168 hours. Recent Labs  Lab 08/12/21 2139  AMMONIA 21   Coagulation Profile: Recent Labs  Lab 08/12/21 1922  INR 1.2   Cardiac Enzymes: No results for input(s): CKTOTAL, CKMB, CKMBINDEX, TROPONINI in the last 168 hours. BNP (last 3 results) No  results for input(s): PROBNP in the last 8760 hours. HbA1C: No results for input(s): HGBA1C in the last 72 hours. CBG: Recent Labs  Lab 08/12/21 1742 08/12/21 2107  GLUCAP 175* 71   Lipid Profile: No results for input(s): CHOL, HDL, LDLCALC, TRIG, CHOLHDL, LDLDIRECT in the last 72 hours. Thyroid Function Tests: Recent Labs    08/12/21 1927  TSH 1.757   Anemia Panel: No results for input(s): VITAMINB12, FOLATE, FERRITIN, TIBC, IRON, RETICCTPCT in the last 72 hours. Urine analysis:    Component Value Date/Time   COLORURINE YELLOW 08/12/2021 2237   APPEARANCEUR CLEAR 08/12/2021 2237   LABSPEC 1.027 08/12/2021 2237   PHURINE 5.0 08/12/2021 2237   GLUCOSEU 50 (A) 08/12/2021 2237   HGBUR NEGATIVE 08/12/2021 2237   [place] NEGATIVE 08/12/2021 2237   KETONESUR 5 (A) 08/12/2021 2237   PROTEINUR NEGATIVE 08/12/2021 2237   NITRITE NEGATIVE 08/12/2021 2237   LEUKOCYTESUR NEGATIVE 08/12/2021 2237   Sepsis Labs: @LABRCNTIP (procalcitonin:4,lacticidven:4) ) Recent Results (from the past 240 hour(s))  Resp Panel by RT-PCR (Flu A&B, Covid) Nasopharyngeal Swab     Status: None   Collection Time: 08/03/21 11:44 AM   Specimen: Nasopharyngeal Swab; Nasopharyngeal(NP) swabs in vial transport medium  Result Value Ref  Range Status   SARS Coronavirus 2 by RT PCR NEGATIVE NEGATIVE Final    Comment: (NOTE) SARS-CoV-2 target nucleic acids are NOT DETECTED.  The SARS-CoV-2 RNA is generally detectable in upper respiratory specimens during the acute phase of infection. The lowest concentration of SARS-CoV-2 viral copies this assay can detect is 138 copies/mL. A negative result does not preclude SARS-Cov-2 infection and should not be used as the sole basis for treatment or other patient management decisions. A negative result may occur with  improper specimen collection/handling, submission of specimen other than nasopharyngeal swab, presence of viral mutation(s) within the areas targeted by this assay, and inadequate number of viral copies(<138 copies/mL). A negative result must be combined with clinical observations, patient history, and epidemiological information. The expected result is Negative.  Fact Sheet for Patients:  EntrepreneurPulse.com.au  Fact Sheet for Healthcare Providers:  IncredibleEmployment.be  This test is no t yet approved or cleared by the [place] FDA and  has been authorized for detection and/or diagnosis of SARS-CoV-2 by FDA under an Emergency Use Authorization (EUA). This EUA will remain  in effect (meaning this test can be used) for the duration of the COVID-19 declaration under Section 564(b)(1) of the Act, 21 U.S.C.section 360bbb-3(b)(1), unless the authorization is terminated  or revoked sooner.       Influenza A by PCR NEGATIVE NEGATIVE Final   Influenza B by PCR NEGATIVE NEGATIVE Final    Comment: (NOTE) The Xpert Xpress SARS-CoV-2/FLU/RSV plus assay is intended as an aid in the diagnosis of influenza from Nasopharyngeal swab specimens and should not be used as a sole basis for treatment. Nasal washings and aspirates are unacceptable for Xpert Xpress SARS-CoV-2/FLU/RSV testing.  Fact Sheet for  Patients: EntrepreneurPulse.com.au  Fact Sheet for Healthcare Providers: IncredibleEmployment.be  This test is not yet approved or cleared by the [place] FDA and has been authorized for detection and/or diagnosis of SARS-CoV-2 by FDA under an Emergency Use Authorization (EUA). This EUA will remain in effect (meaning this test can be used) for the duration of the COVID-19 declaration under Section 564(b)(1) of the Act, 21 U.S.C. section 360bbb-3(b)(1), unless the authorization is terminated or revoked.  Performed at [facility], [address], [place], [place] [address]   Culture, blood (  routine x 2)     Status: None   Collection Time: 08/03/21 11:49 AM   Specimen: Right Antecubital; Blood  Result Value Ref Range Status   Specimen Description   Final    RIGHT ANTECUBITAL BOTTLES DRAWN AEROBIC AND ANAEROBIC   Special Requests Blood Culture adequate volume  Final   Culture   Final    NO GROWTH 5 DAYS Performed at [facility], [address], [place], [place] [address]    Report Status 08/08/2021 FINAL  Final  Culture, blood (routine x 2)     Status: None   Collection Time: 08/03/21 12:00 PM   Specimen: BLOOD RIGHT HAND  Result Value Ref Range Status   Specimen Description   Final    BLOOD RIGHT HAND BOTTLES DRAWN AEROBIC AND ANAEROBIC   Special Requests Blood Culture adequate volume  Final   Culture   Final    NO GROWTH 5 DAYS Performed at [facility], [address], [place], [place] [address]    Report Status 08/08/2021 FINAL  Final  Urine Culture     Status: None   Collection Time: 08/03/21  1:32 PM   Specimen: In/Out Cath Urine  Result Value Ref Range Status   Specimen Description   Final    IN/OUT CATH URINE Performed at [facility], [address] [place] [address] [place], [place] [address]    Special Requests   Final    NONE Performed at [facility], [address] [place], [place] [address]    Culture   Final    NO GROWTH Performed  at [facility], [place] [address] [place], [place] [address]    Report Status 08/05/2021 FINAL  Final  Blood Culture (routine x 2)     Status: None (Preliminary result)   Collection Time: 08/12/21  7:22 PM   Specimen: Blood  Result Value Ref Range Status   Specimen Description BLOOD RIGHT HAND  Final   Special Requests   Final    BOTTLES DRAWN AEROBIC AND ANAEROBIC Blood Culture adequate volume Performed at [facility], [address] [place], [place] [address]    Culture PENDING  Incomplete   Report Status PENDING  Incomplete  Blood Culture (routine x 2)     Status: None (Preliminary result)   Collection Time: 08/12/21  7:22 PM   Specimen: Blood  Result Value Ref Range Status   Specimen Description BLOOD LEFT WRIST  Final   Special Requests   Final    BOTTLES DRAWN AEROBIC AND ANAEROBIC Blood Culture adequate volume Performed at [facility], [address] [place], [place] [address]    Culture PENDING  Incomplete   Report Status PENDING  Incomplete     Radiological Exams on Admission: CT Head Wo Contrast  Result Date: 08/12/2021 CLINICAL DATA:  Altered mental status. EXAM: CT HEAD WITHOUT CONTRAST TECHNIQUE: Contiguous axial images were obtained from the base of the skull through the vertex without intravenous contrast. RADIATION DOSE REDUCTION: This exam was performed according to the departmental dose-optimization program which includes automated exposure control, adjustment of the mA and/or kV according to patient size and/or use of iterative reconstruction technique. COMPARISON:  Head CT dated 02/05/2021. FINDINGS: Brain: Moderate age-related atrophy and chronic microvascular ischemic changes. There is no acute intracranial hemorrhage. No mass effect or midline shift. No extra-axial fluid collection. Vascular: No hyperdense vessel or unexpected calcification. Skull: Normal. Negative for fracture or focal lesion. Sinuses/Orbits: No acute finding. Other: None IMPRESSION: 1. No  acute intracranial pathology.  2. Moderate age-related atrophy and chronic microvascular ischemic changes. Electronically Signed   By: Anner Crete M.D.   On: 08/12/2021 20:53   DG Chest Port 1 View  Result Date: 08/12/2021 CLINICAL DATA:  Sepsis, altered level of consciousness EXAM: PORTABLE CHEST 1 VIEW COMPARISON:  08/03/2021 FINDINGS: Single frontal view of the chest demonstrates an unremarkable cardiac silhouette. No acute airspace disease, effusion, or pneumothorax. No acute bony abnormality. IMPRESSION: 1. No acute intrathoracic process. Electronically Signed   By: Randa Ngo M.D.   On: 08/12/2021 19:57    EKG: Independently reviewed. Sinus rhythm, PVC, RBBB, LAFB.   Assessment/Plan  1. Acute encephalopathy  - Pt was reportedly in usual state today until this afternoon when he was noted to be poorly responsive  - No acute findings on head CT, ammonia and TSH normal  - He was hypoglycemic with EMS, was treated with IV dextrose and juice, has been steadily improving, and this was most likely d/t hypoglycemia  - Continue monitoring, check CBG if worsens again, hold his home diabetes medications     2. Hypothermia  - 34.9 C rectal on arrival  - Resolved quickly and was likely from hypoglycemia   3. Hypoglycemia; type II DM  - He was hypoglycemic with EMS and given IV Dextrose; given juice in ED  - Hold Lantus and glimepiride, check CBGs, use low-intensity SSI if needed for now   4. Dementia  - Calm on admission  - Continue risperidone  - Delirium precautions    5. Hx of CVA  - Continue ASA and statin     DVT prophylaxis: Lovenox  Code Status: Full  Level of Care: Level of care: Telemetry Family Communication: Daughter updated in ED  Disposition Plan:  Patient is from: ALF Anticipated d/c is to: ALF  Anticipated d/c date is: 3/1 or 08/14/21 Patient currently: Pending stable mental status, stable glucose  Consults called: none  Admission status: Observation      Vianne Bulls, MD [facility]  08/12/2021, 11:38 PM

## 2021-08-12 NOTE — ED Triage Notes (Signed)
Pt came from [facility] with c/o AMS, stating usually pt is combative but today is just sitting there.

## 2021-08-12 NOTE — ED Notes (Signed)
Pt unable to sign MSE 

## 2021-08-12 NOTE — ED Notes (Signed)
Patient is no longer on the [place]

## 2021-08-12 NOTE — ED Notes (Signed)
Daughter left to go home. Patient was able to take a few sips of grape juice, but started to fall asleep.

## 2021-08-12 NOTE — ED Notes (Addendum)
Walked into room, patient was sitting at the foot of the bed. Patient was placed back in bed and bed alarm is now active. Patient stated his legs were cold. Patient was covered with warm blankets. Patent was also given more to drink. Curtains will remain open at this time.

## 2021-08-12 NOTE — ED Provider Notes (Addendum)
[facility] Provider Note   CSN: [id number] Arrival date & time: 08/12/21  1733     History  Chief Complaint  Patient presents with   Altered Mental Status    Jacob Little is a 86 y.o. male.  HPI HPI will be deferred due to level 5 caveat altered mental status  Patient with medical history including CAD, hypertension, dementia presents with altered mental status.    Spoke with patient's nursing facility they endorse that patient has had a change in  mental status, he states at baseline patient is able to ambulate and is talkative,  will be combative, they state that when he woke up this morning he was completely fine but around 3 PM he was found sitting in his wheelchair would not answer any questions or removed, today sent here for reevaluation no recent falls on anticoag's states that he is eating during the difficulty no fevers or chills no complaints.  Spoke to patient's daughter who is at bedside for me that generally he is able to walk around talk will, confused at times intensely combative, she states that he is not on any pain medications, not on benzos but does take risperidone as needed for agitation.  She states that she has not noticed any sort of facial asymmetry, no weakness but is unlike him to not respond.  I have reviewed patient's chart he was recently seen in the hospital last week diagnosed with sepsis secondary due to pneumonia started on ceftriaxone and transition to azithromycin and was later discharged home.  Home Medications Prior to Admission medications   Medication Sig Start Date End Date Taking? Authorizing Provider  acetaminophen (TYLENOL) 325 MG tablet Take 2 tablets (650 mg total) by mouth every 4 (four) hours as needed for mild pain (or temp > 37.5 C (99.5 F)). 11/12/17   Barton Dubois, MD  albuterol (PROVENTIL HFA;VENTOLIN HFA) 108 (90 Base) MCG/ACT inhaler Inhale 2 puffs into the lungs every 6 (six) hours as needed for  wheezing or shortness of breath. 03/16/18   Kathie Dike, MD  ASPIRIN LOW DOSE 81 MG EC tablet TAKE (1) TABLET BY MOUTH ONCE DAILY. Patient taking differently: Take 81 mg by mouth daily. 09/20/20   Satira Sark, MD  atorvastatin (LIPITOR) 20 MG tablet Take 20 mg by mouth daily.    [provider]  benzonatate (TESSALON) 100 MG capsule Take 1 capsule (100 mg total) by mouth every 8 (eight) hours. 02/05/21   Smoot, Sarah A, PA-C  cyanocobalamin (,VITAMIN B-12,) 1000 MCG/ML injection Inject 1,000 mcg into the muscle every 30 (thirty) days.  09/14/17   [provider]  Dextromethorphan-guaiFENesin (TUSSIN DM PO) Take 5 mLs by mouth every 6 (six) hours as needed (cough/congestion).    [provider]  glimepiride (AMARYL) 1 MG tablet Take 1 tablet (1 mg total) by mouth every morning. 01/07/18 10/04/19  Barton Dubois, MD  guaiFENesin (MUCINEX) 600 MG 12 hr tablet Take 1 tablet (600 mg total) by mouth 2 (two) times daily. 08/04/21   Kathie Dike, MD  insulin glargine (LANTUS) 100 UNIT/ML injection Inject 22 Units into the skin daily. Hold if BS<60. Notify MD if BS>400    [provider]  ipratropium-albuterol (DUONEB) 0.5-2.5 (3) MG/3ML SOLN Take 3 mLs by nebulization 2 (two) times daily. 08/04/21   Kathie Dike, MD  isosorbide mononitrate (IMDUR) 30 MG 24 hr tablet Take 0.5 tablets (15 mg total) by mouth 2 (two) times daily. 01/07/18 02/05/21  Barton Dubois,  MD  JANUVIA 100 MG tablet Take 1 tablet by mouth daily. 11/04/18   [provider]  metoprolol tartrate (LOPRESSOR) 25 MG tablet Take 0.5 tablets (12.5 mg total) by mouth 2 (two) times daily. 11/12/17   Barton Dubois, MD  nitroGLYCERIN (NITROSTAT) 0.4 MG SL tablet Place 0.4 mg under the tongue every 5 (five) minutes as needed for chest pain.    [provider]  ondansetron (ZOFRAN ODT) 8 MG disintegrating tablet Take 1 tablet (8 mg total) by mouth every 8 (eight) hours as needed for  nausea. Patient not taking: No sig reported 02/24/19   Varney Biles, MD  risperiDONE (RISPERDAL) 0.5 MG tablet Take 0.5 mg by mouth 2 (two) times daily. 07/25/21   [provider]      Allergies    Patient has no known allergies.    Review of Systems   Review of Systems  Unable to perform ROS: Mental status change   Physical Exam Updated Vital Signs BP 137/70    Pulse 61    Temp 97.6 F (36.4 C) (Rectal)    Resp 18    Ht 5\' 8"  (1.727 m)    Wt 66 kg    SpO2 98%    BMI 22.12 kg/m  Physical Exam Vitals and nursing note reviewed.  Constitutional:      General: He is not in acute distress.    Appearance: He is not ill-appearing.  HENT:     Head: Normocephalic and atraumatic.     Comments: No deformity the head present, no raccoon eyes or battle sign noted.    Nose: No congestion.     Mouth/Throat:     Mouth: Mucous membranes are moist.     Pharynx: Oropharynx is clear.  Eyes:     Extraocular Movements: Extraocular movements intact.     Conjunctiva/sclera: Conjunctivae normal.     Pupils: Pupils are equal, round, and reactive to light.  Cardiovascular:     Rate and Rhythm: Normal rate and regular rhythm.     Pulses: Normal pulses.     Heart sounds: No murmur heard.   No friction rub. No gallop.  Pulmonary:     Effort: No respiratory distress.     Breath sounds: No wheezing, rhonchi or rales.  Abdominal:     Palpations: Abdomen is soft.     Tenderness: There is no abdominal tenderness. There is no right CVA tenderness or left CVA tenderness.  Musculoskeletal:     Comments: Patient was does not raise her flashes on my exam, neurovascular fully intact in the upper lower extremities, good palpable radial pulses and dorsal pedal pulses.  Skin:    General: Skin is warm and dry.  Neurological:     Mental Status: He is alert.     Comments: Difficult to assess neurological exam as he was altered, would not answer any questions, no facial asymmetry, EOMs were intact  PERRLA, patient would not follow commands but had purposeful movements in the upper and lower extremities no obvious unilateral weakness present.  Psychiatric:        Mood and Affect: Mood normal.    ED Results / Procedures / Treatments   Labs (all labs ordered are listed, but only abnormal results are displayed) Labs Reviewed  COMPREHENSIVE METABOLIC PANEL - Abnormal; Notable for the following components:      Result Value   Glucose, Bld 104 (*)    Calcium 8.6 (*)    Total Protein 6.3 (*)  Albumin 3.3 (*)    All other components within normal limits  CBC WITH DIFFERENTIAL/PLATELET - Abnormal; Notable for the following components:   RBC 3.86 (*)    Hemoglobin 12.3 (*)    HCT 36.5 (*)    Abs Immature Granulocytes 0.13 (*)    All other components within normal limits  URINALYSIS, ROUTINE W REFLEX MICROSCOPIC - Abnormal; Notable for the following components:   Glucose, UA 50 (*)    Ketones, ur 5 (*)    All other components within normal limits  BLOOD GAS, VENOUS - Abnormal; Notable for the following components:   pH, Ven 7.47 (*)    pCO2, Ven 35 (*)    pO2, Ven 62 (*)    All other components within normal limits  CBG MONITORING, ED - Abnormal; Notable for the following components:   Glucose-Capillary 175 (*)    All other components within normal limits  CULTURE, BLOOD (ROUTINE X 2)  CULTURE, BLOOD (ROUTINE X 2)  URINE CULTURE  LACTIC ACID, PLASMA  PROTIME-INR  APTT  TSH  AMMONIA  CBG MONITORING, ED    EKG None  Radiology CT Head Wo Contrast  Result Date: 08/12/2021 CLINICAL DATA:  Altered mental status. EXAM: CT HEAD WITHOUT CONTRAST TECHNIQUE: Contiguous axial images were obtained from the base of the skull through the vertex without intravenous contrast. RADIATION DOSE REDUCTION: This exam was performed according to the departmental dose-optimization program which includes automated exposure control, adjustment of the mA and/or kV according to patient size and/or  use of iterative reconstruction technique. COMPARISON:  Head CT dated 02/05/2021. FINDINGS: Brain: Moderate age-related atrophy and chronic microvascular ischemic changes. There is no acute intracranial hemorrhage. No mass effect or midline shift. No extra-axial fluid collection. Vascular: No hyperdense vessel or unexpected calcification. Skull: Normal. Negative for fracture or focal lesion. Sinuses/Orbits: No acute finding. Other: None IMPRESSION: 1. No acute intracranial pathology. 2. Moderate age-related atrophy and chronic microvascular ischemic changes. Electronically Signed   By: Anner Crete M.D.   On: 08/12/2021 20:53   DG Chest Port 1 View  Result Date: 08/12/2021 CLINICAL DATA:  Sepsis, altered level of consciousness EXAM: PORTABLE CHEST 1 VIEW COMPARISON:  08/03/2021 FINDINGS: Single frontal view of the chest demonstrates an unremarkable cardiac silhouette. No acute airspace disease, effusion, or pneumothorax. No acute bony abnormality. IMPRESSION: 1. No acute intrathoracic process. Electronically Signed   By: Randa Ngo M.D.   On: 08/12/2021 19:57    Procedures Procedures    Medications Ordered in ED Medications  insulin aspart (novoLOG) injection 0-6 Units (has no administration in time range)    ED Course/ Medical Decision Making/ A&P                           Medical Decision Making Amount and/or Complexity of Data Reviewed Labs: ordered. Radiology: ordered. ECG/medicine tests: ordered.  Risk Decision regarding hospitalization.  This patient presents to the ED for concern of altered mental status, this involves an extensive number of treatment options, and is a complaint that carries with it a high risk of complications and morbidity.  The differential diagnosis includes sepsis, CVA, intracranial head bleed,    Additional history obtained:  Additional history obtained from electronic medical record External records from outside source obtained and reviewed  including please see HPI for further detail   Co morbidities that complicate the patient evaluation  Dementia  Social Determinants of Health:  Dementia    Lab Tests:  I Ordered, and personally interpreted labs.  The pertinent results include: CBC shows no significant hemoglobin 12.3, CMP shows glucose of 104 calcium 8.6, INR prothrombin time unremarkable, venous blood gas shows pH of 7.4 PCO2 35 PO2 62 UA unremarkable ammonia 21   Imaging Studies ordered:  I ordered imaging studies including CT head chest x-ray I independently visualized and interpreted imaging which showed both negative for acute findings I agree with the radiologist interpretation   Cardiac Monitoring:  The patient was maintained on a cardiac monitor.  I personally viewed and interpreted the cardiac monitored which showed an underlying rhythm of: EKG sinus bradycardia without signs of ischemia   Medicines ordered and prescription drug management:  I ordered medication including N/A I have reviewed the patients home medicines and have made adjustments as needed  Critical Interventions:  N/A   Reevaluation:  On arrival patient was altered, unclear etiology, he had a rectal temperature of 95, I am concerned for possible sepsis as he was admitted 2 weeks ago for sepsis.  Will order sepsis lab work-up and also obtain CT head for further evaluation  Lab work imaging unremarkable does not explain patient's current state, he is becoming more active but is still not following commands we will consult with neurology for further recommendations  Patient is reassessed resting company has no complaints will admit patient  Consultations Obtained:  I requested consultation with the neurologist Dr. Theda Sers,  and discussed lab and imaging findings as well as pertinent plan - they recommend: Recommends admission, does not need stat MRI can follow-up in the morning if needed. Spoke with Dr.opyd who will admit the  patient.    Rule out I have low suspicion for systemic infection patient nontoxic-appearing vital signs reassuring no leukocytosis lactic, nontachycardic, no leukocytosis.  He was noted to be hypothermic on arrival but since resolved.  Unclear etiology will defer on antibiotic treatment at this time and continue to monitor has a very low suspicion for infectious etiology.  Low suspicion for CVA or intracranial head bleed CT scan is negative for acute findings, and there is no focal deficit on my exam not on anticoag's no recent head trauma, he is noted to be altered but I suspect this is likely secondary due to polypharmacy and/or worsening dementia, delirium,  will need further evaluation.  I have low suspicion for pneumonia lung sounds are clear bilaterally no new hypoxia chest x-ray unremarkable.  Low suspicion for abdominal abnormality as he has a nonsurgical abdomen, no nausea or vomiting.    Dispostion and problem list  After consideration of the diagnostic results and the patients response to treatment, I feel that the patent would benefit from admission.  Acute encephalopathy-unclear urology but I suspect polypharmacy versus worsening dementia will need further monitoring possible MRI for further evaluation..            Final Clinical Impression(s) / ED Diagnoses Final diagnoses:  Acute encephalopathy    Rx / DC Orders ED Discharge Orders     None         Marcello Fennel, PA-C 08/12/21 2336    Marcello Fennel, PA-C 08/12/21 2347    Jeanell Sparrow, DO 08/13/21 1809

## 2021-08-13 ENCOUNTER — Other Ambulatory Visit: Payer: Self-pay

## 2021-08-13 DIAGNOSIS — Z8673 Personal history of transient ischemic attack (TIA), and cerebral infarction without residual deficits: Secondary | ICD-10-CM | POA: Diagnosis not present

## 2021-08-13 DIAGNOSIS — T68XXXD Hypothermia, subsequent encounter: Secondary | ICD-10-CM

## 2021-08-13 DIAGNOSIS — Z794 Long term (current) use of insulin: Secondary | ICD-10-CM | POA: Diagnosis not present

## 2021-08-13 DIAGNOSIS — E162 Hypoglycemia, unspecified: Secondary | ICD-10-CM | POA: Diagnosis not present

## 2021-08-13 DIAGNOSIS — F01518 Vascular dementia, unspecified severity, with other behavioral disturbance: Secondary | ICD-10-CM | POA: Diagnosis not present

## 2021-08-13 DIAGNOSIS — E119 Type 2 diabetes mellitus without complications: Secondary | ICD-10-CM | POA: Diagnosis not present

## 2021-08-13 DIAGNOSIS — G9340 Encephalopathy, unspecified: Secondary | ICD-10-CM | POA: Diagnosis not present

## 2021-08-13 LAB — GLUCOSE, CAPILLARY
Glucose-Capillary: 106 mg/dL — ABNORMAL HIGH (ref 70–99)
Glucose-Capillary: 127 mg/dL — ABNORMAL HIGH (ref 70–99)
Glucose-Capillary: 160 mg/dL — ABNORMAL HIGH (ref 70–99)
Glucose-Capillary: 171 mg/dL — ABNORMAL HIGH (ref 70–99)
Glucose-Capillary: 184 mg/dL — ABNORMAL HIGH (ref 70–99)
Glucose-Capillary: 246 mg/dL — ABNORMAL HIGH (ref 70–99)

## 2021-08-13 LAB — BASIC METABOLIC PANEL
Anion gap: 6 (ref 5–15)
BUN: 16 mg/dL (ref 8–23)
CO2: 25 mmol/L (ref 22–32)
Calcium: 8.7 mg/dL — ABNORMAL LOW (ref 8.9–10.3)
Chloride: 106 mmol/L (ref 98–111)
Creatinine, Ser: 0.95 mg/dL (ref 0.61–1.24)
GFR, Estimated: >60 mL/min (ref >60)
Glucose, Bld: 64 mg/dL — ABNORMAL LOW (ref 70–99)
Potassium: 3.6 mmol/L (ref 3.5–5.1)
Sodium: 137 mmol/L (ref 135–145)

## 2021-08-13 LAB — CBC
HCT: 38.5 % — ABNORMAL LOW (ref 39.0–52.0)
Hemoglobin: 12.5 g/dL — ABNORMAL LOW (ref 13.0–17.0)
MCH: 31 pg (ref 26.0–34.0)
MCHC: 32.5 g/dL (ref 30.0–36.0)
MCV: 95.5 fL (ref 80.0–100.0)
Platelets: 232 10*3/uL (ref 150–400)
RBC: 4.03 MIL/uL — ABNORMAL LOW (ref 4.22–5.81)
RDW: 12.7 % (ref 11.5–15.5)
WBC: 8.8 10*3/uL (ref 4.0–10.5)
nRBC: 0 % (ref 0.0–0.2)

## 2021-08-13 LAB — RESP PANEL BY RT-PCR (FLU A&B, COVID) ARPGX2
Influenza A by PCR: NEGATIVE
Influenza B by PCR: NEGATIVE
SARS Coronavirus 2 by RT PCR: NEGATIVE

## 2021-08-13 LAB — CBG MONITORING, ED
Glucose-Capillary: 60 mg/dL — ABNORMAL LOW (ref 70–99)
Glucose-Capillary: 101 mg/dL — ABNORMAL HIGH (ref 70–99)

## 2021-08-13 MED ORDER — DEXTROSE 10 % IV SOLN: INTRAVENOUS | Status: DC

## 2021-08-13 NOTE — ED Notes (Signed)
Patient is refusing to eat or take any meds ? ?

## 2021-08-13 NOTE — Assessment & Plan Note (Addendum)
Likely due to severe hypoglycemia from insulin ?Treated and resolved.  ?

## 2021-08-13 NOTE — Evaluation (Addendum)
Clinical/Bedside Swallow Evaluation ?Patient Details  ?Name: Jacob Little ?MRN: [id number] ?Date of Birth: 1935/10/10 ? ?Today's Date: 08/13/2021 ?Time: SLP Start Time (ACUTE ONLY): 2122 SLP Stop Time (ACUTE ONLY): 1310 ?SLP Time Calculation (min) (ACUTE ONLY): 26 min ? ?Past Medical History:  ?Past Medical History:  ?Diagnosis Date  ? Atherosclerotic heart disease   ? Cerebral embolism with cerebral infarction [facility])   ? From LTC paperwork  ? Coronary artery disease   ? DES to circumflex 2006  ? Dementia ([place])   ? Encephalopathy   ? Essential hypertension   ? Hyperlipidemia   ? Rheumatic fever   ? Stroke [facility])   ? Type 2 diabetes mellitus ([place])   ? ?Past Surgical History:  ?Past Surgical History:  ?Procedure Laterality Date  ? APPENDECTOMY    ? CORONARY ANGIOPLASTY WITH STENT PLACEMENT    ? ?HPI:  ?Jacob Little is a pleasant 86 y.o. male with medical history significant for history of CVA, dementia, type 2 diabetes mellitus, and recent admission with pneumonia, now presenting from his nursing facility for evaluation of decreased level of consciousness.  The patient was reportedly in his usual state today until this afternoon when he was noted to be lethargic, not speaking, and then poorly responsive.  EMS was called and patient was found to be hypoglycemic.  He was treated with IV dextrose and brought into the ED.  At his baseline, patient is reportedly ambulatory, interacts verbally, but often agitated. Pt seen by SLP last week and had MBSS on Friday before discharging back to [facility]. He was placed on a regular diet with thin liquids with po meds crushed in puree as able.  ? ?MBSS from 08/05/21 ?<<Pt presents with moderate primary cognitive based oral dysphagia and mild pharyngeal dysphagia. Pt was provided sedating medication this am ~4 hrs prior to the study which likely contributed to his lethargic presentation; Pt required frequent tactile and verbal cues to maintain appropriate positioning and alertness.  Despite lethargy and sub-optimal positioning, no penetration or aspiration was visualized during this study. Oral phase is characterized by lingual pumping and prolonged AP transit, further note mild oral residue after the initial swallow. Pharyngeal phase is characterized by good laryngeal vestibule closure, good hyolaryngeal excursion and fully intact pharyngeal stripping wave, however occasional trace to min pharyngeal residue is noted after initial swallow and cleared with second swallow cued& reflexive. Pt was presented with barium tablet with puree and despite multiple presentations of thin and puree was unable to swallow tablet and it was ultimately expectorated; suspect inability to coordinate swallow of tablet was related to current mentation. Recommend upgrade Pt's diet to regular with thin liquids. Please ensure Pt is alert and responsive and sitting upright for all PO. Recommend meds be administered crushed with puree. There are no further ST needs noted at this time. Results reviewed with RN and MD.>> ?  ?Assessment / Plan / Recommendation  ?Clinical Impression ? Clinical swallow evaluation completed at bedside. Pt known to SLP service from admission last week with BSE and MBSS completed (see results within this report) with recommendation for regular textures and thin liquids with po medications crushed in puree as able. Pt with improved alertness since SLP visit last week and able to participate in oral motor evaluation. Pt presents with mild oral phase dysphagia negatively impacted by poor dentition and suspected delay in swallow initiation. Pt with some coughing after straw sips of thin liquids and prolonged mastication with solids (he needed it broken  into smaller pieces). Given recent MBSS from Friday, will place Pt on D3/mech soft diet with thin liquids, no straws, po medication whole or crushed as able in puree. No further SLP services indicated at this time. SLP will sign off. ?SLP Visit  Diagnosis: Dysphagia, unspecified (R13.10) ?   ?Aspiration Risk ? Mild aspiration risk  ?  ?Diet Recommendation Dysphagia 3 (Mech soft);Thin liquid  ? ?Liquid Administration via: Cup;No straw ?Medication Administration: Whole meds with puree (or crush as able in puree) ?Supervision: Staff to assist with self feeding;Full supervision/cueing for compensatory strategies ?Compensations: Slow rate;Small sips/bites;Lingual sweep for clearance of pocketing;Multiple dry swallows after each bite/sip ?Postural Changes: Seated upright at 90 degrees;Remain upright for at least 30 minutes after po intake  ?  ?Other  Recommendations Oral Care Recommendations: Oral care BID;Staff/trained caregiver to provide oral care ?Other Recommendations: Clarify dietary restrictions   ? ?Recommendations for follow up therapy are one component of a multi-disciplinary discharge planning process, led by the attending physician.  Recommendations may be updated based on patient status, additional functional criteria and insurance authorization. ? ?Follow up Recommendations No SLP follow up  ? ? ?  ?Assistance Recommended at Discharge Frequent or constant Supervision/Assistance  ?Functional Status Assessment Patient has had a recent decline in their functional status and demonstrates the ability to make significant improvements in function in a reasonable and predictable amount of time.  ?Frequency and Duration min 1 x/week  ?1 week ?  ?   ? ?Prognosis Prognosis for Safe Diet Advancement: Good ?Barriers to Reach Goals: Cognitive deficits  ? ?  ? ?Swallow Study   ?General Date of Onset: 08/12/21 ?HPI: Jacob Little is a pleasant 86 y.o. male with medical history significant for history of CVA, dementia, type 2 diabetes mellitus, and recent admission with pneumonia, now presenting from his nursing facility for evaluation of decreased level of consciousness.  The patient was reportedly in his usual state today until this afternoon when he was noted  to be lethargic, not speaking, and then poorly responsive.  EMS was called and patient was found to be hypoglycemic.  He was treated with IV dextrose and brought into the ED.  At his baseline, patient is reportedly ambulatory, interacts verbally, but often agitated. Pt seen by SLP last week and had MBSS on Friday before discharging back to [facility]. He was placed on a regular diet with thin liquids with po meds crushed in puree as able. ?Type of Study: Bedside Swallow Evaluation ?Previous Swallow Assessment: MBSS 08/05/21 reg/thin ?Diet Prior to this Study: Regular;Thin liquids ?Temperature Spikes Noted: No ?Respiratory Status: Room air ?History of Recent Intubation: No ?Behavior/Cognition: Alert;Cooperative;Requires cueing ?Oral Cavity Assessment: Within Functional Limits ?Oral Care Completed by SLP: Recent completion by staff ?Oral Cavity - Dentition: Poor condition;Missing dentition ?Vision: Functional for self-feeding ?Self-Feeding Abilities: Needs set up ?Patient Positioning: Upright in bed ?Baseline Vocal Quality: Low vocal intensity ?Volitional Cough: Congested;Weak ?Volitional Swallow: Able to elicit  ?  ?Oral/Motor/Sensory Function Overall Oral Motor/Sensory Function: Generalized oral weakness   ?Ice Chips Ice chips: Within functional limits ?Presentation: Spoon   ?Thin Liquid Thin Liquid: Impaired ?Presentation: Cup;Spoon;Straw ?Pharyngeal  Phase Impairments: Cough - Delayed (after straw sips)  ?  ?Nectar Thick Nectar Thick Liquid: Not tested   ?Honey Thick Honey Thick Liquid: Not tested   ?Puree Puree: Within functional limits ?Presentation: Spoon   ?Solid ? ? ?  Solid: Impaired ?Presentation: Spoon ?Oral Phase Impairments: Impaired mastication ?Oral Phase Functional Implications: Prolonged oral transit  ? ?  ?  Thank you, ? ?Genene Churn, [place] ?[phone] ? ?Eean Buss ?08/13/2021,1:13 PM ? ? ? ? ?

## 2021-08-13 NOTE — Progress Notes (Signed)
PROGRESS NOTE   Jacob Little  ZOX:[id number] DOB: 02-26-1936 DOA: 08/12/2021 PCP: [name], MD   Chief Complaint  Patient presents with   Altered Mental Status   Level of care: Telemetry  Brief Admission History:  86 y.o. male with medical history significant for history of CVA, dementia, type 2 diabetes mellitus, and recent admission with pneumonia, now presenting from his nursing facility for evaluation of decreased level of consciousness.  The patient was reportedly in his usual state today until this afternoon when he was noted to be lethargic, not speaking, and then poorly responsive.  EMS was called and patient was found to be hypoglycemic.  He was treated with IV dextrose and brought into the ED.  At his baseline, patient is reportedly ambulatory, interacts verbally, but often agitated.   ED Course: Upon arrival to the ED, patient is found to have a rectal temperature of 34.9 C and mild bradycardia with stable blood pressure.  EKG features sinus rhythm with PVC, RBBB, and LAFB.  No acute findings on head CT or chest x-ray.  Chemistry panel and CBC unremarkable, TSH normal, and ammonia normal.  Warming blankets were applied briefly.  Patient has been improving but is still not back to baseline per his daughter.   08/13/2021: BS checked this AM and down to 60, started D10 infusion and frequent CBG monitoring.  Poor oral intake remains unchanged.  Dementia and confusion remains unchanged.     Assessment and Plan: * Acute encephalopathy- (present on admission) Likely precipitated by persistent hypoglycemia which is being treated.   Hypothermia- (present on admission) Likely due to severe hypoglycemia from insulin Treated and resolved now.  Follow.   History of stroke Vascular dementia   Insulin dependent type 2 diabetes mellitus ([place]) Holding all insulin for now in setting of persistent hypoglycemia  Vascular dementia with behavior disturbance- (present on admission) Close  monitoring orders / delirium precautions ordered  DVT prophylaxis:  Code Status:  Family Communication:  Disposition: Status is: Observation The patient remains OBS appropriate and will d/c before 2 midnights.   Consultants:   Procedures:   Antimicrobials:    Subjective: Pt remains withdrawn and confused.  Not eating well.  Objective: Vitals:   08/13/21 0900 08/13/21 1059 08/13/21 1125 08/13/21 1540  BP: 138/68 (!) 118/96 128/69 106/64  Pulse: 83 (!) 59 (!) 54 64  Resp: 17 15 20 20   Temp:   97.8 F (36.6 C) 97.6 F (36.4 C)  TempSrc:   Oral Oral  SpO2: 95% 98% 99% 99%  Weight:      Height:        Intake/Output Summary (Last 24 hours) at 08/13/2021 1737 Last data filed at 08/13/2021 1644 Gross per 24 hour  Intake 400.04 ml  Output --  Net 400.04 ml   Filed Weights   08/12/21 1747  Weight: 66 kg   Examination:  General exam: frail elderly male with dementia, Appears calm and comfortable  Respiratory system: Clear to auscultation. Respiratory effort normal. Cardiovascular system: normal S1 & S2 heard. No JVD, murmurs, rubs, gallops or clicks. No pedal edema. Gastrointestinal system: Abdomen is nondistended, soft and nontender. No organomegaly or masses felt. Normal bowel sounds heard. Central nervous system: Alert and disoriented with obvious dementia.  No focal neurological deficits. Extremities: Symmetric 5 x 5 power. Skin: No rashes, lesions or ulcers. Psychiatry: Judgement and insight appear poor with dementia. Mood & affect flat.    Data Reviewed: I have personally reviewed following labs and imaging  studies  CBC: Recent Labs  Lab 08/12/21 1922 08/13/21 0321  WBC 9.1 8.8  NEUTROABS 6.7  --   HGB 12.3* 12.5*  HCT 36.5* 38.5*  MCV 94.6 95.5  PLT 220 035    Basic Metabolic Panel: Recent Labs  Lab 08/12/21 1922 08/13/21 0321  NA 137 137  K 3.7 3.6  CL 108 106  CO2 22 25  GLUCOSE 104* 64*  BUN 19 16  CREATININE 0.92 0.95  CALCIUM 8.6* 8.7*     CBG: Recent Labs  Lab 08/13/21 0756 08/13/21 1052 08/13/21 1309 08/13/21 1505 08/13/21 1706  GLUCAP 60* 101* 106* 127* 171*    Recent Results (from the past 240 hour(s))  Blood Culture (routine x 2)     Status: None (Preliminary result)   Collection Time: 08/12/21  7:22 PM   Specimen: BLOOD RIGHT HAND  Result Value Ref Range Status   Specimen Description BLOOD RIGHT HAND  Final   Special Requests   Final    BOTTLES DRAWN AEROBIC AND ANAEROBIC Blood Culture adequate volume   Culture   Final    NO GROWTH < 24 HOURS Performed at [facility], [address] [place], [place] [address]    Report Status PENDING  Incomplete  Blood Culture (routine x 2)     Status: None (Preliminary result)   Collection Time: 08/12/21  7:22 PM   Specimen: BLOOD LEFT WRIST  Result Value Ref Range Status   Specimen Description BLOOD LEFT WRIST  Final   Special Requests   Final    BOTTLES DRAWN AEROBIC AND ANAEROBIC Blood Culture adequate volume   Culture   Final    NO GROWTH < 24 HOURS Performed at [facility], [address], [place], [place] [address]    Report Status PENDING  Incomplete  Resp Panel by RT-PCR (Flu A&B, Covid) Nasopharyngeal Swab     Status: None   Collection Time: 08/13/21  3:48 AM   Specimen: Nasopharyngeal Swab; Nasopharyngeal(NP) swabs in vial transport medium  Result Value Ref Range Status   SARS Coronavirus 2 by RT PCR NEGATIVE NEGATIVE Final    Comment: (NOTE) SARS-CoV-2 target nucleic acids are NOT DETECTED.  The SARS-CoV-2 RNA is generally detectable in upper respiratory specimens during the acute phase of infection. The lowest concentration of SARS-CoV-2 viral copies this assay can detect is 138 copies/mL. A negative result does not preclude SARS-Cov-2 infection and should not be used as the sole basis for treatment or other patient management decisions. A negative result may occur with  improper specimen collection/handling, submission of specimen other than  nasopharyngeal swab, presence of viral mutation(s) within the areas targeted by this assay, and inadequate number of viral copies(<138 copies/mL). A negative result must be combined with clinical observations, patient history, and epidemiological information. The expected result is Negative.  Fact Sheet for Patients:  EntrepreneurPulse.com.au  Fact Sheet for Healthcare Providers:  IncredibleEmployment.be  This test is no t yet approved or cleared by the [place] FDA and  has been authorized for detection and/or diagnosis of SARS-CoV-2 by FDA under an Emergency Use Authorization (EUA). This EUA will remain  in effect (meaning this test can be used) for the duration of the COVID-19 declaration under Section 564(b)(1) of the Act, 21 U.S.C.section 360bbb-3(b)(1), unless the authorization is terminated  or revoked sooner.       Influenza A by PCR NEGATIVE NEGATIVE Final   Influenza B by PCR NEGATIVE NEGATIVE Final    Comment: (NOTE) The Xpert Xpress  SARS-CoV-2/FLU/RSV plus assay is intended as an aid in the diagnosis of influenza from Nasopharyngeal swab specimens and should not be used as a sole basis for treatment. Nasal washings and aspirates are unacceptable for Xpert Xpress SARS-CoV-2/FLU/RSV testing.  Fact Sheet for Patients: EntrepreneurPulse.com.au  Fact Sheet for Healthcare Providers: IncredibleEmployment.be  This test is not yet approved or cleared by the [place] FDA and has been authorized for detection and/or diagnosis of SARS-CoV-2 by FDA under an Emergency Use Authorization (EUA). This EUA will remain in effect (meaning this test can be used) for the duration of the COVID-19 declaration under Section 564(b)(1) of the Act, 21 U.S.C. section 360bbb-3(b)(1), unless the authorization is terminated or revoked.  Performed at [facility], [address] [place], [place] [address]       Radiology Studies: CT Head Wo Contrast  Result Date: 08/12/2021 CLINICAL DATA:  Altered mental status. EXAM: CT HEAD WITHOUT CONTRAST TECHNIQUE: Contiguous axial images were obtained from the base of the skull through the vertex without intravenous contrast. RADIATION DOSE REDUCTION: This exam was performed according to the departmental dose-optimization program which includes automated exposure control, adjustment of the mA and/or kV according to patient size and/or use of iterative reconstruction technique. COMPARISON:  Head CT dated 02/05/2021. FINDINGS: Brain: Moderate age-related atrophy and chronic microvascular ischemic changes. There is no acute intracranial hemorrhage. No mass effect or midline shift. No extra-axial fluid collection. Vascular: No hyperdense vessel or unexpected calcification. Skull: Normal. Negative for fracture or focal lesion. Sinuses/Orbits: No acute finding. Other: None IMPRESSION: 1. No acute intracranial pathology. 2. Moderate age-related atrophy and chronic microvascular ischemic changes. Electronically Signed   By: Anner Crete M.D.   On: 08/12/2021 20:53   DG Chest Port 1 View  Result Date: 08/12/2021 CLINICAL DATA:  Sepsis, altered level of consciousness EXAM: PORTABLE CHEST 1 VIEW COMPARISON:  08/03/2021 FINDINGS: Single frontal view of the chest demonstrates an unremarkable cardiac silhouette. No acute airspace disease, effusion, or pneumothorax. No acute bony abnormality. IMPRESSION: 1. No acute intrathoracic process. Electronically Signed   By: Randa Ngo M.D.   On: 08/12/2021 19:57    Scheduled Meds:  aspirin EC  81 mg Oral Daily   atorvastatin  20 mg Oral Daily   enoxaparin (LOVENOX) injection  40 mg Subcutaneous Q24H   isosorbide mononitrate  15 mg Oral BID   metoprolol tartrate  12.5 mg Oral BID   risperiDONE  0.5 mg Oral BID   sodium chloride flush  3 mL Intravenous Q12H   Continuous Infusions:  dextrose 50 mL/hr at 08/13/21 1644     LOS:  0 days   Time spent: 35 mins  Romelle Muldoon Wynetta Emery, MD How to contact the [facility] Attending or Consulting provider [place] or covering provider during after hours [place], for this patient?  Check the care team in [facility] and look for a) attending/consulting TRH provider listed and b) the [facility] team listed Log into [email or web address] and use [place]'s universal password to access. If you do not have the password, please contact the hospital operator. Locate the [facility] provider you are looking for under [facility] and page to a number that you can be directly reached. If you still have difficulty reaching the provider, please page the [place] (Director on Call) for the Hospitalists listed on amion for assistance.  08/13/2021, 5:37 PM

## 2021-08-13 NOTE — Assessment & Plan Note (Addendum)
Appears to be stable at his baseline / delirium precautions ordered while in hospital.  ?

## 2021-08-13 NOTE — ED Notes (Signed)
Gave grape juice, patient initially did ok but then got choked and started coughing.  ?

## 2021-08-13 NOTE — TOC Initial Note (Signed)
Transition of Care (TOC) - Initial/Assessment Note  ? ? ?Patient Details  ?Name: Jacob Little ?MRN: [id number] ?Date of Birth: 09-Dec-1935 ? ?Transition of Care (TOC) CM/SW Contact:    ?Iona Beard, LCSWA ?Phone Number: ?08/13/2021, 11:53 AM ? ?Clinical Narrative:                 ?CSW spoke with [facility] about pt. Pt needs assistance with ADLs. Pts daughter is his main contact. Pt will be returning to [facility] at D/C. CSW started Fl2. TOC to follow.  ? ?Expected Discharge Plan: Assisted Living ?Barriers to Discharge: Continued Medical Work up ? ? ?Patient Goals and CMS Choice ?Patient states their goals for this hospitalization and ongoing recovery are:: Return to ALF ?CMS Medicare.gov Compare Post Acute Care list provided to:: Patient Represenative (must comment) ?  ? ?Expected Discharge Plan and Services ?Expected Discharge Plan: Assisted Living ?In-house Referral: Clinical Social Work ?  ?Post Acute Care Choice: Resumption of Svcs/PTA Provider ?Living arrangements for the past 2 months: [place] ?                ?  ?  ?  ?  ?  ?  ?  ?  ?  ?  ? ?Prior Living Arrangements/Services ?Living arrangements for the past 2 months: [place] ?Lives with:: Facility Resident ?  ?Do you feel safe going back to the place where you live?: Yes      ?Need for Family Participation in Patient Care: Yes (Comment) ?Care giver support system in place?: Yes (comment) ?Current home services: DME ?Criminal Activity/Legal Involvement Pertinent to Current Situation/Hospitalization: No - Comment as needed ? ?Activities of Daily Living ?Home Assistive Devices/Equipment: Wheelchair ?ADL Screening (condition at time of admission) ?Patient's cognitive ability adequate to safely complete daily activities?: No ?Is the patient deaf or have difficulty hearing?: Yes ?Does the patient have difficulty seeing, even when wearing glasses/contacts?: No ?Does the patient have difficulty concentrating, remembering, or  making decisions?: Yes ?Patient able to express need for assistance with ADLs?: Yes ?Does the patient have difficulty dressing or bathing?: Yes ?Independently performs ADLs?: No ?Communication: Independent ?Dressing (OT): Needs assistance ?Is this a change from baseline?: Pre-admission baseline ?Grooming: Needs assistance ?Is this a change from baseline?: Pre-admission baseline ?Feeding: Needs assistance ?Is this a change from baseline?: Pre-admission baseline ?Bathing: Needs assistance ?Is this a change from baseline?: Pre-admission baseline ?Toileting: Needs assistance ?Is this a change from baseline?: Pre-admission baseline ?In/Out Bed: Needs assistance ?Is this a change from baseline?: Pre-admission baseline ?Walks in Home: Needs assistance ?Is this a change from baseline?: Pre-admission baseline ?Does the patient have difficulty walking or climbing stairs?: Yes ?Weakness of Legs: Both ?Weakness of Arms/Hands: None ? ?Permission Sought/Granted ?Permission sought to share information with : Customer service manager ?  ?   ?   ?   ?   ? ?Emotional Assessment ?  ?  ?  ?Orientation: : Oriented to Self ?  ?  ? ?Admission diagnosis:  Acute encephalopathy [G93.40] ?Patient Active Problem List  ? Diagnosis Date Noted  ? History of stroke 08/12/2021  ? Hypothermia 08/12/2021  ? Acute encephalopathy 08/03/2021  ? Insulin dependent type 2 diabetes mellitus ([place]) 08/03/2021  ? Viral bronchitis 03/16/2018  ? Tremor of left hand 03/16/2018  ? 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 Acute metabolic encephalopathy 78/29/5621  ? Dehydration   ? Orthostatic hypotension   ? Generalized weakness 01/05/2018  ? Acute renal  failure superimposed on stage 3 chronic kidney disease ([place]) 01/05/2018  ? Lactic acidosis 01/05/2018  ? Diarrhea 01/05/2018  ? Vascular dementia with behavior disturbance   ? Acute embolic stroke ([place]) 18/40/3754  ? Encephalopathy 11/08/2017  ? Acute ischemic stroke ([place]) 11/08/2017  ? Essential  hypertension 11/08/2017  ? Diabetes mellitus with hyperglycemia, without long-term current use of insulin ([place]) 11/08/2017  ? Acute ischemic left MCA stroke ([place]) 11/08/2017  ? DENTAL PAIN 05/12/2010  ? CORONARY ATHEROSCLEROSIS NATIVE CORONARY ARTERY 07/22/2009  ? DM 07/12/2009  ? HLD (hyperlipidemia) 07/12/2009  ? HYPERTENSION 07/12/2009  ? ANGINA, ATYPICAL 07/12/2009  ? ?PCP:  [name], MD ?Pharmacy:   ?[facility] ?[address] ?[place] [address] ?Phone: [phone] Fax: [phone] ? ?[facility], [place] ?[place] ?[place]  [address] ?Phone: [phone] Fax: [phone] ? ? ? ? ?Social Determinants of Health (SDOH) Interventions ?  ? ?Readmission Risk Interventions ?Readmission Risk Prevention Plan 08/05/2021  ?Transportation Screening Complete  ?PCP or Specialist Appt within 5-7 Days Complete  ?Home Care Screening Complete  ?Medication Review (RN CM) Complete  ?Some recent data might be hidden  ? ? ? ?

## 2021-08-13 NOTE — Assessment & Plan Note (Signed)
Vascular dementia  ?

## 2021-08-13 NOTE — Progress Notes (Signed)
?   08/13/21 1648  ?Safety Observation   ?Observer at bedside Yes  ? ? ?

## 2021-08-13 NOTE — Hospital Course (Addendum)
86 y.o. male with medical history significant for history of CVA, dementia, type 2 diabetes mellitus, and recent admission with pneumonia, now presenting from his nursing facility for evaluation of decreased level of consciousness.  The patient was reportedly in his usual state today until this afternoon when he was noted to be lethargic, not speaking, and then poorly responsive.  EMS was called and patient was found to be hypoglycemic.  He was treated with IV dextrose and brought into the ED.  At his baseline, patient is reportedly ambulatory, interacts verbally, but often agitated. ?  ?ED Course: Upon arrival to the ED, patient is found to have a rectal temperature of 34.9 C and mild bradycardia with stable blood pressure.  EKG features sinus rhythm with PVC, RBBB, and LAFB.  No acute findings on head CT or chest x-ray.  Chemistry panel and CBC unremarkable, TSH normal, and ammonia normal.  Warming blankets were applied briefly.  Patient has been improving but is still not back to baseline per his daughter.  ? ?08/13/2021: BS checked this AM and down to 60, started D10 infusion and frequent CBG monitoring.  Poor oral intake remains unchanged.  Dementia and confusion remains unchanged.   ? ?08/14/2021:  Pt ate breakfast well.  No further hypoglycemia as insulin and glimepiride is wearing out of system.  Pt can safely discharge back to [facility] today.   ?

## 2021-08-13 NOTE — ED Notes (Signed)
Patient being moved so sitter can watch patient so patient doesn't get out of bed.  Moving to room 16 ?

## 2021-08-13 NOTE — Assessment & Plan Note (Addendum)
Holding all insulin for now in setting of persistent hypoglycemia ?Januvia and diet control recommended only.  PLEASE AVOID RESTARTING LONG ACTING INSULIN AND GLIMEPIRIDE (SULFONLYUREAS) ?

## 2021-08-13 NOTE — ED Notes (Signed)
Redirected patient to stay in the bed. Patient consistently trying to get out of the bed.  Patient states "I can't stay in the bed".  Agricultural consultant notified for Air cabin crew.  ?

## 2021-08-13 NOTE — Assessment & Plan Note (Addendum)
Secondary to persistent hypoglycemia which has now resolved and he seems to be back at his baseline status.   ?

## 2021-08-13 NOTE — ED Notes (Signed)
Gave pt bfast tray, he does not like the oatmeal or potatoes. He is drinking a meal replacement shake and is eating some of his eggs. ?

## 2021-08-14 ENCOUNTER — Other Ambulatory Visit: Payer: Self-pay

## 2021-08-14 ENCOUNTER — Encounter (HOSPITAL_COMMUNITY): Payer: Self-pay | Admitting: Family Medicine

## 2021-08-14 DIAGNOSIS — Z8673 Personal history of transient ischemic attack (TIA), and cerebral infarction without residual deficits: Secondary | ICD-10-CM | POA: Diagnosis not present

## 2021-08-14 DIAGNOSIS — E119 Type 2 diabetes mellitus without complications: Secondary | ICD-10-CM | POA: Diagnosis not present

## 2021-08-14 DIAGNOSIS — T68XXXD Hypothermia, subsequent encounter: Secondary | ICD-10-CM | POA: Diagnosis not present

## 2021-08-14 DIAGNOSIS — F01518 Vascular dementia, unspecified severity, with other behavioral disturbance: Secondary | ICD-10-CM | POA: Diagnosis not present

## 2021-08-14 DIAGNOSIS — Z794 Long term (current) use of insulin: Secondary | ICD-10-CM | POA: Diagnosis not present

## 2021-08-14 DIAGNOSIS — E162 Hypoglycemia, unspecified: Secondary | ICD-10-CM

## 2021-08-14 DIAGNOSIS — G9340 Encephalopathy, unspecified: Secondary | ICD-10-CM | POA: Diagnosis not present

## 2021-08-14 LAB — BASIC METABOLIC PANEL
Anion gap: 6 (ref 5–15)
BUN: 13 mg/dL (ref 8–23)
CO2: 25 mmol/L (ref 22–32)
Calcium: 8.7 mg/dL — ABNORMAL LOW (ref 8.9–10.3)
Chloride: 106 mmol/L (ref 98–111)
Creatinine, Ser: 0.9 mg/dL (ref 0.61–1.24)
GFR, Estimated: >60 mL/min (ref >60)
Glucose, Bld: 141 mg/dL — ABNORMAL HIGH (ref 70–99)
Potassium: 4.3 mmol/L (ref 3.5–5.1)
Sodium: 137 mmol/L (ref 135–145)

## 2021-08-14 LAB — GLUCOSE, CAPILLARY
Glucose-Capillary: 117 mg/dL — ABNORMAL HIGH (ref 70–99)
Glucose-Capillary: 186 mg/dL — ABNORMAL HIGH (ref 70–99)
Glucose-Capillary: 165 mg/dL — ABNORMAL HIGH (ref 70–99)

## 2021-08-14 LAB — URINE CULTURE: Culture: NO GROWTH

## 2021-08-14 MED ORDER — HALOPERIDOL LACTATE 5 MG/ML IJ SOLN
1.0000 mg | Freq: Four times a day (QID) | INTRAMUSCULAR | Status: DC | PRN
  Filled 2021-08-14: qty 1

## 2021-08-14 MED ORDER — HALOPERIDOL LACTATE 5 MG/ML IJ SOLN
1.0000 mg | Freq: Four times a day (QID) | INTRAMUSCULAR | Status: DC | PRN
  Administered 2021-08-14: 1 mg via INTRAMUSCULAR

## 2021-08-14 NOTE — Care Management Obs Status (Signed)
MEDICARE OBSERVATION STATUS NOTIFICATION ? ? ?Patient Details  ?Name: Jacob Little ?MRN: [id number] ?Date of Birth: 08-21-1935 ? ? ?Medicare Observation Status Notification Given:  Yes ? ? ? ?Tommy Medal ?08/14/2021, 10:00 AM ?

## 2021-08-14 NOTE — Plan of Care (Signed)
?  Problem: Clinical Measurements: ?Goal: Diagnostic test results will improve ?Outcome: Progressing ?  ?Problem: Nutrition: ?Goal: Adequate nutrition will be maintained ?Outcome: Progressing ?  ?Problem: Coping: ?Goal: Level of anxiety will decrease ?Outcome: Progressing ?  ?Problem: Elimination: ?Goal: Will not experience complications related to bowel motility ?Outcome: Progressing ?Goal: Will not experience complications related to urinary retention ?Outcome: Progressing ?  ?Problem: Safety: ?Goal: Ability to remain free from injury will improve ?Outcome: Progressing ?  ?Problem: Skin Integrity: ?Goal: Risk for impaired skin integrity will decrease ?Outcome: Progressing ?  ?

## 2021-08-14 NOTE — [place] FL2 (Deleted)
? MEDICAID FL2 LEVEL OF CARE SCREENING TOOL  ?  ? ?IDENTIFICATION  ?Patient Name: ?Jacob Little Birthdate: [birth year] Sex: male Admission Date (Current Location): ?08/12/2021  ?[place] and [place] Number: ? [place] and Address:  ?[facility] [address], [place] ?     Provider Number: ?[id number]  ?Attending Physician Name and Address:  ?Murlean Iba, MD ? Relative Name and Phone Number:  ?  ?   ?Current Level of Care: ?Hospital Recommended Level of Care: ?Assisted Living Facility Prior Approval Number: ?  ? ?Date Approved/Denied: ?  PASRR Number: ?  ? ?Discharge Plan: ?Domiciliary (Rest home) [facility]) ?  ? ?Current Diagnoses: ?Patient Active Problem List  ? Diagnosis Date Noted  ? History of stroke 08/12/2021  ? Hypothermia 08/12/2021  ? Acute encephalopathy 08/03/2021  ? Insulin dependent type 2 diabetes mellitus ([place]) 08/03/2021  ? Viral bronchitis 03/16/2018  ? Tremor of left hand 03/16/2018  ? 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 Acute metabolic encephalopathy 71/21/9758  ? Dehydration   ? Orthostatic hypotension   ? Generalized weakness 01/05/2018  ? Acute renal failure superimposed on stage 3 chronic kidney disease ([place]) 01/05/2018  ? Lactic acidosis 01/05/2018  ? Diarrhea 01/05/2018  ? Vascular dementia with behavior disturbance   ? Acute embolic stroke ([place]) 83/25/4982  ? Encephalopathy 11/08/2017  ? Acute ischemic stroke ([place]) 11/08/2017  ? Essential hypertension 11/08/2017  ? Diabetes mellitus with hyperglycemia, without long-term current use of insulin ([place]) 11/08/2017  ? Acute ischemic left MCA stroke ([place]) 11/08/2017  ? DENTAL PAIN 05/12/2010  ? CORONARY ATHEROSCLEROSIS NATIVE CORONARY ARTERY 07/22/2009  ? DM 07/12/2009  ? HLD (hyperlipidemia) 07/12/2009  ? HYPERTENSION 07/12/2009  ? ANGINA, ATYPICAL 07/12/2009  ? ? ?Orientation RESPIRATION BLADDER Height & Weight   ?  ?Self ?     Weight: 145 lb 8.1 oz (66 kg) ?Height:  5'  8" (172.7 cm)  ?BEHAVIORAL SYMPTOMS/MOOD NEUROLOGICAL BOWEL NUTRITION STATUS  ?      Diet (low sodium heart health which facility says equates to their no added table salt regular diet)  ?AMBULATORY STATUS COMMUNICATION OF NEEDS Skin   ?Limited Assist Verbally   ?  ?  ?  ?    ?     ?     ? ? ?Personal Care Assistance Level of Assistance  ?Bathing, Feeding, Dressing Bathing Assistance: Limited assistance ?Feeding assistance: Independent ?Dressing Assistance: Limited assistance ?   ? ?Functional Limitations Info  ?Sight, Hearing, Speech Sight Info: Adequate ?Hearing Info: Adequate ?Speech Info: Adequate  ? ? ?SPECIAL CARE FACTORS FREQUENCY  ?    ?  ?  ?  ?  ?  ?  ?   ? ? ?Contractures Contractures Info: Not present  ? ? ?Additional Factors Info  ?Code Status, Allergies, Psychotropic Code Status Info: FULL Code ?Allergies Info: NKA ?Psychotropic Info: Risperdal ?  ?  ?   ? ?Current Medications (08/14/2021):  This is the current hospital active medication list ?Current Facility-Administered Medications  ?Medication Dose Route Frequency Provider Last Rate Last Admin  ? acetaminophen (TYLENOL) tablet 650 mg  650 mg Oral Q6H PRN Opyd, Ilene Qua, MD      ? Or  ? acetaminophen (TYLENOL) suppository 650 mg  650 mg Rectal Q6H PRN Opyd, Ilene Qua, MD      ? albuterol (VENTOLIN HFA) 108 (90 Base) MCG/ACT inhaler 2 puff  2 puff Inhalation Q6H PRN Opyd,  Ilene Qua, MD      ? aspirin EC tablet 81 mg  81 mg Oral Daily Opyd, Ilene Qua, MD   81 mg at 08/13/21 1012  ? atorvastatin (LIPITOR) tablet 20 mg  20 mg Oral Daily Opyd, Ilene Qua, MD   20 mg at 08/13/21 1011  ? enoxaparin (LOVENOX) injection 40 mg  40 mg Subcutaneous Q24H Opyd, Ilene Qua, MD   40 mg at 08/13/21 1017  ? haloperidol lactate (HALDOL) injection 1 mg  1 mg Intramuscular Q6H PRN Wynetta Emery, Clanford L, MD   1 mg at 08/14/21 0820  ? isosorbide mononitrate (IMDUR) 24 hr tablet 15 mg  15 mg Oral BID Opyd, Ilene Qua, MD   15 mg at 08/13/21 2101  ? metoprolol tartrate  (LOPRESSOR) tablet 12.5 mg  12.5 mg Oral BID Opyd, Ilene Qua, MD   12.5 mg at 08/13/21 2100  ? ondansetron (ZOFRAN) tablet 4 mg  4 mg Oral Q6H PRN Opyd, Ilene Qua, MD      ? Or  ? ondansetron (ZOFRAN) injection 4 mg  4 mg Intravenous Q6H PRN Opyd, Ilene Qua, MD      ? risperiDONE (RISPERDAL) tablet 0.5 mg  0.5 mg Oral BID Opyd, Ilene Qua, MD   0.5 mg at 08/13/21 2102  ? senna-docusate (Senokot-S) tablet 1 tablet  1 tablet Oral QHS PRN Opyd, Ilene Qua, MD      ? sodium chloride flush (NS) 0.9 % injection 3 mL  3 mL Intravenous Q12H Opyd, Ilene Qua, MD   3 mL at 08/13/21 2102  ? ? ? ?Discharge Medications: ? ?STOP taking these medications   ?  ?glimepiride 1 MG tablet ?Commonly known as: Amaryl ?   ?guaiFENesin 600 MG 12 hr tablet ?Commonly known as: [place] ?   ?insulin glargine 100 UNIT/ML injection ?Commonly known as: LANTUS ?   ?ondansetron 8 MG disintegrating tablet ?Commonly known as: Zofran ODT ?   ?  ?   ?  ?TAKE these medications   ?  ?acetaminophen 325 MG tablet ?Commonly known as: TYLENOL ?Take 2 tablets (650 mg total) by mouth every 4 (four) hours as needed for mild pain (or temp > 37.5 C (99.5 F)). ?   ?albuterol 108 (90 Base) MCG/ACT inhaler ?Commonly known as: VENTOLIN HFA ?Inhale 2 puffs into the lungs every 6 (six) hours as needed for wheezing or shortness of breath. ?   ?Aspirin Low Dose 81 MG EC tablet ?Generic drug: aspirin ?TAKE (1) TABLET BY MOUTH ONCE DAILY. ?What changed: See the new instructions. ?   ?atorvastatin 20 MG tablet ?Commonly known as: LIPITOR ?Take 20 mg by mouth daily. ?   ?cyanocobalamin 1000 MCG/ML injection ?Commonly known as: (VITAMIN B-12) ?Inject 1,000 mcg into the muscle every 30 (thirty) days. ?   ?ipratropium-albuterol 0.5-2.5 (3) MG/3ML Soln ?Commonly known as: DUONEB ?Take 3 mLs by nebulization 2 (two) times daily. ?   ?isosorbide mononitrate 30 MG 24 hr tablet ?Commonly known as: IMDUR ?Take 0.5 tablets (15 mg total) by mouth 2 (two) times daily. ?   ?Januvia 100 MG  tablet ?Generic drug: sitaGLIPtin ?Take 1 tablet by mouth daily. ?   ?metoprolol tartrate 25 MG tablet ?Commonly known as: LOPRESSOR ?Take 0.5 tablets (12.5 mg total) by mouth 2 (two) times daily. ?   ?nitroGLYCERIN 0.4 MG SL tablet ?Commonly known as: NITROSTAT ?Place 0.4 mg under the tongue every 5 (five) minutes as needed for chest pain. ?   ?risperiDONE 0.5 MG tablet ?Commonly known as:  RISPERDAL ?Take 0.5 mg by mouth 2 (two) times daily. ?   ?TUSSIN DM PO ?Take 5 mLs by mouth every 6 (six) hours as needed (cough/congestion). ?   ? ? ?Relevant Imaging Results: ? ?Relevant Lab Results: ? ? ?Additional Information ?SSN: [id number] ? ?Shade Flood, LCSW ? ? ? ? ?

## 2021-08-14 NOTE — Discharge Summary (Addendum)
Physician Discharge Summary  Jacob Little GYF:[id number] DOB: 03-15-1936 DOA: 08/12/2021  PCP: [name], MD  Admit date: 08/12/2021 Discharge date: 08/14/2021  Admitted From:  [facility] Disposition: return to [facility]   Recommendations for Outpatient Follow-up:  Please avoid restarting glimepiride and long acting insulin due to severe hypoglycemia Please check CBG at least three times per day and when not feeling well No concentrated sweets or fruit juices except to treat a low blood glucose   Discharge Condition: STABLE   CODE STATUS: FULL DIET: regular, reduced concentrated sweets and fruit juices except to treat a low blood sugar   Brief Hospitalization Summary: Please see all hospital notes, images, labs for full details of the hospitalization. ADMISSION HPI: Jacob Little is a pleasant 86 y.o. male with medical history significant for history of CVA, dementia, type 2 diabetes mellitus, and recent admission with pneumonia, now presenting from his nursing facility for evaluation of decreased level of consciousness.  The patient was reportedly in his usual state today until this afternoon when he was noted to be lethargic, not speaking, and then poorly responsive.  EMS was called and patient was found to be hypoglycemic.  He was treated with IV dextrose and brought into the ED.  At his baseline, patient is reportedly ambulatory, interacts verbally, but often agitated.   ED Course: Upon arrival to the ED, patient is found to have a rectal temperature of 34.9 C and mild bradycardia with stable blood pressure.  EKG features sinus rhythm with PVC, RBBB, and LAFB.  No acute findings on head CT or chest x-ray.  Chemistry panel and CBC unremarkable, TSH normal, and ammonia normal.  Warming blankets were applied briefly.  Patient has been improving but is still not back to baseline per his daughter.   HOSPITAL COURSE BY PROBLEM LIST  86 y.o. male with medical history significant for history  of CVA, dementia, type 2 diabetes mellitus, and recent admission with pneumonia, now presenting from his nursing facility for evaluation of decreased level of consciousness.  The patient was reportedly in his usual state today until this afternoon when he was noted to be lethargic, not speaking, and then poorly responsive.  EMS was called and patient was found to be hypoglycemic.  He was treated with IV dextrose and brought into the ED.  At his baseline, patient is reportedly ambulatory, interacts verbally, but often agitated.   ED Course: Upon arrival to the ED, patient is found to have a rectal temperature of 34.9 C and mild bradycardia with stable blood pressure.  EKG features sinus rhythm with PVC, RBBB, and LAFB.  No acute findings on head CT or chest x-ray.  Chemistry panel and CBC unremarkable, TSH normal, and ammonia normal.  Warming blankets were applied briefly.  Patient has been improving but is still not back to baseline per his daughter.   08/13/2021: BS checked this AM and down to 60, started D10 infusion and frequent CBG monitoring.  Poor oral intake remains unchanged.  Dementia and confusion remains unchanged.    08/14/2021:  Pt ate breakfast well.  No further hypoglycemia as insulin and glimepiride is wearing out of system.  Pt can safely discharge back to [facility] today.    Assessment and Plan: * Acute encephalopathy- (present on admission) Secondary to persistent hypoglycemia which has now resolved and he seems to be back at his baseline status.    Hypothermia- (present on admission) Likely due to severe hypoglycemia from insulin Treated and resolved.  History of stroke Vascular dementia   Insulin dependent type 2 diabetes mellitus ([place]) Holding all insulin for now in setting of persistent hypoglycemia Januvia and diet control recommended only.  PLEASE AVOID RESTARTING LONG ACTING INSULIN AND GLIMEPIRIDE (SULFONLYUREAS)  Vascular dementia with behavior disturbance- (present  on admission) Appears to be stable at his baseline / delirium precautions ordered while in hospital.    Discharge Diagnoses:  Principal Problem:   Acute encephalopathy Active Problems:   Vascular dementia with behavior disturbance   Insulin dependent type 2 diabetes mellitus ([place])   History of stroke   Hypothermia   Discharge Instructions:  Allergies as of 08/14/2021   No Known Allergies      Medication List     STOP taking these medications    glimepiride 1 MG tablet Commonly known as: Amaryl   guaiFENesin 600 MG 12 hr tablet Commonly known as: MUCINEX   insulin glargine 100 UNIT/ML injection Commonly known as: LANTUS   ondansetron 8 MG disintegrating tablet Commonly known as: Zofran ODT       TAKE these medications    acetaminophen 325 MG tablet Commonly known as: TYLENOL Take 2 tablets (650 mg total) by mouth every 4 (four) hours as needed for mild pain (or temp > 37.5 C (99.5 F)).   albuterol 108 (90 Base) MCG/ACT inhaler Commonly known as: VENTOLIN HFA Inhale 2 puffs into the lungs every 6 (six) hours as needed for wheezing or shortness of breath.   Aspirin Low Dose 81 MG EC tablet Generic drug: aspirin TAKE (1) TABLET BY MOUTH ONCE DAILY. What changed: See the new instructions.   atorvastatin 20 MG tablet Commonly known as: LIPITOR Take 20 mg by mouth daily.   cyanocobalamin 1000 MCG/ML injection Commonly known as: (VITAMIN B-12) Inject 1,000 mcg into the muscle every 30 (thirty) days.   ipratropium-albuterol 0.5-2.5 (3) MG/3ML Soln Commonly known as: DUONEB Take 3 mLs by nebulization 2 (two) times daily.   isosorbide mononitrate 30 MG 24 hr tablet Commonly known as: IMDUR Take 0.5 tablets (15 mg total) by mouth 2 (two) times daily.   Januvia 100 MG tablet Generic drug: sitaGLIPtin Take 1 tablet by mouth daily.   metoprolol tartrate 25 MG tablet Commonly known as: LOPRESSOR Take 0.5 tablets (12.5 mg total) by mouth 2 (two) times  daily.   nitroGLYCERIN 0.4 MG SL tablet Commonly known as: NITROSTAT Place 0.4 mg under the tongue every 5 (five) minutes as needed for chest pain.   risperiDONE 0.5 MG tablet Commonly known as: RISPERDAL Take 0.5 mg by mouth 2 (two) times daily.   TUSSIN DM PO Take 5 mLs by mouth every 6 (six) hours as needed (cough/congestion).        Follow-up Information     [name], MD. Schedule an appointment as soon as possible for a visit in 1 week(s).   Specialty: Internal Medicine Why: Hospital Follow Up Contact information: [address] [place] [address] [phone]                No Known Allergies Allergies as of 08/14/2021   No Known Allergies      Medication List     STOP taking these medications    glimepiride 1 MG tablet Commonly known as: Amaryl   guaiFENesin 600 MG 12 hr tablet Commonly known as: MUCINEX   insulin glargine 100 UNIT/ML injection Commonly known as: LANTUS   ondansetron 8 MG disintegrating tablet Commonly known as: Zofran ODT  TAKE these medications    acetaminophen 325 MG tablet Commonly known as: TYLENOL Take 2 tablets (650 mg total) by mouth every 4 (four) hours as needed for mild pain (or temp > 37.5 C (99.5 F)).   albuterol 108 (90 Base) MCG/ACT inhaler Commonly known as: VENTOLIN HFA Inhale 2 puffs into the lungs every 6 (six) hours as needed for wheezing or shortness of breath.   Aspirin Low Dose 81 MG EC tablet Generic drug: aspirin TAKE (1) TABLET BY MOUTH ONCE DAILY. What changed: See the new instructions.   atorvastatin 20 MG tablet Commonly known as: LIPITOR Take 20 mg by mouth daily.   cyanocobalamin 1000 MCG/ML injection Commonly known as: (VITAMIN B-12) Inject 1,000 mcg into the muscle every 30 (thirty) days.   ipratropium-albuterol 0.5-2.5 (3) MG/3ML Soln Commonly known as: DUONEB Take 3 mLs by nebulization 2 (two) times daily.   isosorbide mononitrate 30 MG 24 hr  tablet Commonly known as: IMDUR Take 0.5 tablets (15 mg total) by mouth 2 (two) times daily.   Januvia 100 MG tablet Generic drug: sitaGLIPtin Take 1 tablet by mouth daily.   metoprolol tartrate 25 MG tablet Commonly known as: LOPRESSOR Take 0.5 tablets (12.5 mg total) by mouth 2 (two) times daily.   nitroGLYCERIN 0.4 MG SL tablet Commonly known as: NITROSTAT Place 0.4 mg under the tongue every 5 (five) minutes as needed for chest pain.   risperiDONE 0.5 MG tablet Commonly known as: RISPERDAL Take 0.5 mg by mouth 2 (two) times daily.   TUSSIN DM PO Take 5 mLs by mouth every 6 (six) hours as needed (cough/congestion).        Procedures/Studies: CT Head Wo Contrast  Result Date: 08/12/2021 CLINICAL DATA:  Altered mental status. EXAM: CT HEAD WITHOUT CONTRAST TECHNIQUE: Contiguous axial images were obtained from the base of the skull through the vertex without intravenous contrast. RADIATION DOSE REDUCTION: This exam was performed according to the departmental dose-optimization program which includes automated exposure control, adjustment of the mA and/or kV according to patient size and/or use of iterative reconstruction technique. COMPARISON:  Head CT dated 02/05/2021. FINDINGS: Brain: Moderate age-related atrophy and chronic microvascular ischemic changes. There is no acute intracranial hemorrhage. No mass effect or midline shift. No extra-axial fluid collection. Vascular: No hyperdense vessel or unexpected calcification. Skull: Normal. Negative for fracture or focal lesion. Sinuses/Orbits: No acute finding. Other: None IMPRESSION: 1. No acute intracranial pathology. 2. Moderate age-related atrophy and chronic microvascular ischemic changes. Electronically Signed   By: Anner Crete M.D.   On: 08/12/2021 20:53   CT CHEST WO CONTRAST  Result Date: 08/03/2021 CLINICAL DATA:  Respiratory illness. EXAM: CT CHEST WITHOUT CONTRAST TECHNIQUE: Multidetector CT imaging of the chest was  performed following the standard protocol without IV contrast. RADIATION DOSE REDUCTION: This exam was performed according to the departmental dose-optimization program which includes automated exposure control, adjustment of the mA and/or kV according to patient size and/or use of iterative reconstruction technique. COMPARISON:  None. FINDINGS: Cardiovascular: Heart size upper normal. Coronary artery calcification is evident. Mild atherosclerotic calcification is noted in the wall of the thoracic aorta. Mediastinum/Nodes: No mediastinal lymphadenopathy. Upper normal lymph nodes in the mediastinum are presumably reactive. No evidence for gross hilar lymphadenopathy although assessment is limited by the lack of intravenous contrast on the current study. The esophagus has normal imaging features. There is no axillary lymphadenopathy. Lungs/Pleura: Centrilobular emphsyema noted. Patchy airspace disease noted posterior lung bases bilaterally with peripheral small airway impaction associated. Calcified granuloma  noted right upper lobe. No suspicious pulmonary nodule or mass. No pleural effusion. Upper Abdomen: Unremarkable. Musculoskeletal: No worrisome lytic or sclerotic osseous abnormality. IMPRESSION: 1. Patchy airspace disease posterior lung bases bilaterally with peripheral small airway impaction. Imaging features compatible with multifocal pneumonia. 2. Aortic Atherosclerosis (ICD10-I70.0) and Emphysema (ICD10-J43.9). Electronically Signed   By: Misty Stanley M.D.   On: 08/03/2021 18:29   DG Chest Port 1 View  Result Date: 08/12/2021 CLINICAL DATA:  Sepsis, altered level of consciousness EXAM: PORTABLE CHEST 1 VIEW COMPARISON:  08/03/2021 FINDINGS: Single frontal view of the chest demonstrates an unremarkable cardiac silhouette. No acute airspace disease, effusion, or pneumothorax. No acute bony abnormality. IMPRESSION: 1. No acute intrathoracic process. Electronically Signed   By: Randa Ngo M.D.   On:  08/12/2021 19:57   DG Chest Port 1 View  Result Date: 08/03/2021 CLINICAL DATA:  sob EXAM: PORTABLE CHEST 1 VIEW COMPARISON:  Multiple priors, most recent 02/05/2021. FINDINGS: No consolidation. Mild streaky left basilar opacities, which appear chronic and likely represent atelectasis/scar. No visible pleural effusions or pneumothorax. Cardiomediastinal silhouette is unchanged. IMPRESSION: No evidence of acute cardiopulmonary disease. Electronically Signed   By: Margaretha Sheffield M.D.   On: 08/03/2021 12:10   DG Swallowing Func-Speech Pathology  Result Date: 08/05/2021 Table formatting from the original result was not included. Objective Swallowing Evaluation: Type of Study: MBS-Modified Barium Swallow Study  Patient Details Name: Jacob Little MRN: [id number] Date of Birth: 1935-07-27 Today's Date: 08/05/2021 Time: SLP Start Time (ACUTE ONLY): 0930 -SLP Stop Time (ACUTE ONLY): 0956 SLP Time Calculation (min) (ACUTE ONLY): 26 min Past Medical History: Past Medical History: Diagnosis Date  Atherosclerotic heart disease   Cerebral embolism with cerebral infarction ([place])   From LTC paperwork  Coronary artery disease   DES to circumflex 2006  Dementia ([place])   Encephalopathy   Essential hypertension   Hyperlipidemia   Rheumatic fever   Stroke ([place])   Type 2 diabetes mellitus ([place])  Past Surgical History: Past Surgical History: Procedure Laterality Date  APPENDECTOMY    CORONARY ANGIOPLASTY WITH STENT PLACEMENT   HPI: Jacob Little is a 86 y.o. male with medical history significant of vascular dementia, hypertension, hyperlipidemia, insulin-dependent diabetes, who is a resident of assisted living facility.  Per his family, he was sent to the emergency room today when he told facility staff that he did not feel well.  Further details around his state of health and complaints are unclear.  Patient is unable to provide any further history due to dementia.  He was evaluated in the emergency room where he was noted to  be mildly hypotensive, but responded to IV fluids.  Noted to have significant leukocytosis and mildly elevated lactic acid.  Chest x-ray was unremarkable, but since he had a productive cough, CT chest was obtained that showed multifocal pneumonia.  He is being admitted for further management. BSE resulting in recommendation for MBSS to objectively assess the swallowing function.  Subjective: "Meh"  Recommendations for follow up therapy are one component of a multi-disciplinary discharge planning process, led by the attending physician.  Recommendations may be updated based on patient status, additional functional criteria and insurance authorization. Assessment / Plan / Recommendation Clinical Impressions 08/05/2021 Clinical Impression Pt presents with moderate primary cognitive based oral dysphagia and mild pharyngeal dysphagia. Pt was provided sedating medication this am ~4 hrs prior to the study which likely contributed to his lethargic presentation; Pt required frequent tactile and verbal cues to maintain appropriate positioning  and alertness. Despite lethargy and sub-optimal positioning, no penetration or aspiration was visualized during this study. Oral phase is characterized by lingual pumping and prolonged AP transit, further note mild oral residue after the initial swallow. Pharyngeal phase is characterized by good laryngeal vestibule closure, good hyolaryngeal excursion and fully intact pharyngeal stripping wave, however occasional trace to min pharyngeal residue is noted after initial swallow and cleared with second swallow cued& reflexive. Pt was presented with barium tablet with puree and despite multiple presentations of thin and puree was unable to swallow tablet and it was ultimately expectorated; suspect inability to coordinate swallow of tablet was related to current mentation. Recommend upgrade Pt's diet to regular with thin liquids. Please ensure Pt is alert and responsive and sitting upright for  all PO. Recommend meds be administered crushed with puree. There are no further ST needs noted at this time. Results reviewed with RN and MD. SLP Visit Diagnosis Dysphagia, unspecified (R13.10) Attention and concentration deficit following -- Frontal lobe and executive function deficit following -- Impact on safety and function Moderate aspiration risk   Treatment Recommendations 08/05/2021 Treatment Recommendations Therapy as outlined in treatment plan below   Prognosis 08/05/2021 Prognosis for Safe Diet Advancement Good Barriers to Reach Goals Cognitive deficits Barriers/Prognosis Comment -- Diet Recommendations 08/05/2021 SLP Diet Recommendations Regular solids;Thin liquid Liquid Administration via Cup;No straw Medication Administration Crushed with puree Compensations Slow rate;Small sips/bites;Lingual sweep for clearance of pocketing;Multiple dry swallows after each bite/sip Postural Changes Seated upright at 90 degrees   Other Recommendations 08/05/2021 Recommended Consults -- Oral Care Recommendations Oral care BID Other Recommendations -- Follow Up Recommendations Skilled nursing-short term rehab (<3 hours/day) Assistance recommended at discharge -- Functional Status Assessment -- Frequency and Duration  08/04/2021 Speech Therapy Frequency (ACUTE ONLY) min 2x/week Treatment Duration 1 week   Oral Phase 08/05/2021 Oral Phase Impaired Oral - Pudding Teaspoon -- Oral - Pudding Cup -- Oral - Honey Teaspoon -- Oral - Honey Cup -- Oral - Nectar Teaspoon NT Oral - Nectar Cup Impaired mastication;Lingual pumping;Piecemeal swallowing;Reduced posterior propulsion Oral - Nectar Straw NT Oral - Thin Teaspoon Impaired mastication;Lingual pumping;Piecemeal swallowing;Reduced posterior propulsion Oral - Thin Cup Impaired mastication;Lingual pumping;Piecemeal swallowing;Reduced posterior propulsion;Decreased bolus cohesion;Premature spillage Oral - Thin Straw Impaired mastication;Lingual pumping;Piecemeal swallowing;Reduced  posterior propulsion;Decreased bolus cohesion;Premature spillage Oral - Puree Lingual pumping;Weak lingual manipulation;Reduced posterior propulsion;Piecemeal swallowing;Lingual/palatal residue;Decreased bolus cohesion Oral - Mech Soft NT Oral - Regular Lingual pumping;Weak lingual manipulation;Reduced posterior propulsion;Piecemeal swallowing;Lingual/palatal residue;Decreased bolus cohesion Oral - Multi-Consistency NT Oral - Pill Holding of bolus Oral Phase - Comment --  Pharyngeal Phase 08/05/2021 Pharyngeal Phase Impaired Pharyngeal- Pudding Teaspoon -- Pharyngeal -- Pharyngeal- Pudding Cup -- Pharyngeal -- Pharyngeal- Honey Teaspoon -- Pharyngeal -- Pharyngeal- Honey Cup -- Pharyngeal -- Pharyngeal- Nectar Teaspoon NT Pharyngeal -- Pharyngeal- Nectar Cup Pharyngeal residue - valleculae;Pharyngeal residue - pyriform;Delayed swallow initiation-vallecula;Reduced pharyngeal peristalsis;Inter-arytenoid space residue;Lateral channel residue Pharyngeal -- Pharyngeal- Nectar Straw NT Pharyngeal -- Pharyngeal- Thin Teaspoon Pharyngeal residue - valleculae;Pharyngeal residue - pyriform;Delayed swallow initiation-vallecula;Reduced pharyngeal peristalsis;Inter-arytenoid space residue;Lateral channel residue Pharyngeal Material does not enter airway Pharyngeal- Thin Cup Pharyngeal residue - valleculae;Pharyngeal residue - pyriform;Delayed swallow initiation-vallecula;Reduced pharyngeal peristalsis;Inter-arytenoid space residue;Lateral channel residue Pharyngeal Material does not enter airway Pharyngeal- Thin Straw Pharyngeal residue - valleculae;Pharyngeal residue - pyriform;Delayed swallow initiation-vallecula;Reduced pharyngeal peristalsis;Inter-arytenoid space residue;Lateral channel residue Pharyngeal Material does not enter airway Pharyngeal- Puree Delayed swallow initiation-vallecula Pharyngeal -- Pharyngeal- Mechanical Soft -- Pharyngeal -- Pharyngeal- Regular -- Pharyngeal -- Pharyngeal- Multi-consistency --  Pharyngeal -- Pharyngeal-  Pill -- Pharyngeal -- Pharyngeal Comment --  No flowsheet data found. Amelia H. Roddie Mc, CCC-SLP Speech Language Pathologist Wende Bushy 08/05/2021, 11:01 AM                       Subjective: Pt ate breakfast well today.  No further hypoglycemia.    Discharge Exam: Vitals:   08/13/21 2326 08/14/21 0538  BP: 123/67 120/82  Pulse: (!) 56 (!) 57  Resp: 15 15  Temp: 97.8 F (36.6 C) 97.9 F (36.6 C)  SpO2: 100% 97%   Vitals:   08/13/21 1540 08/13/21 1920 08/13/21 2326 08/14/21 0538  BP: 106/64 111/61 123/67 120/82  Pulse: 64 66 (!) 56 (!) 57  Resp: 20 18 15 15   Temp: 97.6 F (36.4 C) 97.8 F (36.6 C) 97.8 F (36.6 C) 97.9 F (36.6 C)  TempSrc: Oral Oral    SpO2: 99% 94% 100% 97%  Weight:      Height:       General: Pt is alert, awake, confused with dementia, cooperative, not combattive, not in acute distress Cardiovascular: normal S1/S2 +, no rubs, no gallops Respiratory: CTA bilaterally, no wheezing, no rhonchi Abdominal: Soft, NT, ND, bowel sounds + Extremities: no edema, no cyanosis   The results of significant diagnostics from this hospitalization (including imaging, microbiology, ancillary and laboratory) are listed below for reference.     Microbiology: Recent Results (from the past 240 hour(s))  Blood Culture (routine x 2)     Status: None (Preliminary result)   Collection Time: 08/12/21  7:22 PM   Specimen: BLOOD RIGHT HAND  Result Value Ref Range Status   Specimen Description BLOOD RIGHT HAND  Final   Special Requests   Final    BOTTLES DRAWN AEROBIC AND ANAEROBIC Blood Culture adequate volume   Culture   Final    NO GROWTH < 24 HOURS Performed at [facility], [address], [place], [place] [address]    Report Status PENDING  Incomplete  Blood Culture (routine x 2)     Status: None (Preliminary result)   Collection Time: 08/12/21  7:22 PM   Specimen: BLOOD LEFT WRIST  Result Value Ref Range Status   Specimen  Description BLOOD LEFT WRIST  Final   Special Requests   Final    BOTTLES DRAWN AEROBIC AND ANAEROBIC Blood Culture adequate volume   Culture   Final    NO GROWTH < 24 HOURS Performed at [facility], [address] [place], [place] [address]    Report Status PENDING  Incomplete  Urine Culture     Status: None   Collection Time: 08/12/21 10:37 PM   Specimen: Urine, Clean Catch  Result Value Ref Range Status   Specimen Description   Final    URINE, CLEAN CATCH Performed at [facility], [address], [place], [place] [address]    Special Requests   Final    NONE Performed at [facility], [address], [place], [place] [address]    Culture   Final    NO GROWTH Performed at [facility], [place] [address], [place], [place] [address]    Report Status 08/14/2021 FINAL  Final  Resp Panel by RT-PCR (Flu A&B, Covid) Nasopharyngeal Swab     Status: None   Collection Time: 08/13/21  3:48 AM   Specimen: Nasopharyngeal Swab; Nasopharyngeal(NP) swabs in vial transport medium  Result Value Ref Range Status   SARS Coronavirus 2 by RT PCR NEGATIVE  NEGATIVE Final    Comment: (NOTE) SARS-CoV-2 target nucleic acids are NOT DETECTED.  The SARS-CoV-2 RNA is generally detectable in upper respiratory specimens during the acute phase of infection. The lowest concentration of SARS-CoV-2 viral copies this assay can detect is 138 copies/mL. A negative result does not preclude SARS-Cov-2 infection and should not be used as the sole basis for treatment or other patient management decisions. A negative result may occur with  improper specimen collection/handling, submission of specimen other than nasopharyngeal swab, presence of viral mutation(s) within the areas targeted by this assay, and inadequate number of viral copies(<138 copies/mL). A negative result must be combined with clinical observations, patient history, and epidemiological information. The expected result is Negative.  Fact  Sheet for Patients:  EntrepreneurPulse.com.au  Fact Sheet for Healthcare Providers:  IncredibleEmployment.be  This test is no t yet approved or cleared by the [place] FDA and  has been authorized for detection and/or diagnosis of SARS-CoV-2 by FDA under an Emergency Use Authorization (EUA). This EUA will remain  in effect (meaning this test can be used) for the duration of the COVID-19 declaration under Section 564(b)(1) of the Act, 21 U.S.C.section 360bbb-3(b)(1), unless the authorization is terminated  or revoked sooner.       Influenza A by PCR NEGATIVE NEGATIVE Final   Influenza B by PCR NEGATIVE NEGATIVE Final    Comment: (NOTE) The Xpert Xpress SARS-CoV-2/FLU/RSV plus assay is intended as an aid in the diagnosis of influenza from Nasopharyngeal swab specimens and should not be used as a sole basis for treatment. Nasal washings and aspirates are unacceptable for Xpert Xpress SARS-CoV-2/FLU/RSV testing.  Fact Sheet for Patients: EntrepreneurPulse.com.au  Fact Sheet for Healthcare Providers: IncredibleEmployment.be  This test is not yet approved or cleared by the [place] FDA and has been authorized for detection and/or diagnosis of SARS-CoV-2 by FDA under an Emergency Use Authorization (EUA). This EUA will remain in effect (meaning this test can be used) for the duration of the COVID-19 declaration under Section 564(b)(1) of the Act, 21 U.S.C. section 360bbb-3(b)(1), unless the authorization is terminated or revoked.  Performed at [facility], [address], [place], [place] [address]      Labs: BNP (last 3 results) Recent Labs    08/03/21 1145  BNP 160.1*   Basic Metabolic Panel: Recent Labs  Lab 08/12/21 1922 08/13/21 0321 08/14/21 0543  NA 137 137 137  K 3.7 3.6 4.3  CL 108 106 106  CO2 22 25 25   GLUCOSE 104* 64* 141*  BUN 19 16 13   CREATININE 0.92 0.95 0.90  CALCIUM  8.6* 8.7* 8.7*   Liver Function Tests: Recent Labs  Lab 08/12/21 1922  AST 27  ALT 38  ALKPHOS 51  BILITOT 0.8  PROT 6.3*  ALBUMIN 3.3*   No results for input(s): LIPASE, AMYLASE in the last 168 hours. Recent Labs  Lab 08/12/21 2139  AMMONIA 21   CBC: Recent Labs  Lab 08/12/21 1922 08/13/21 0321  WBC 9.1 8.8  NEUTROABS 6.7  --   HGB 12.3* 12.5*  HCT 36.5* 38.5*  MCV 94.6 95.5  PLT 220 232   Cardiac Enzymes: No results for input(s): CKTOTAL, CKMB, CKMBINDEX, TROPONINI in the last 168 hours. BNP: Invalid input(s): POCBNP CBG: Recent Labs  Lab 08/13/21 1706 08/13/21 1906 08/13/21 2048 08/13/21 2332 08/14/21 0715  GLUCAP 171* 246* 184* 160* 117*   D-Dimer No results for input(s): DDIMER in the last 72 hours. Hgb A1c No results for input(s): HGBA1C  in the last 72 hours. Lipid Profile No results for input(s): CHOL, HDL, LDLCALC, TRIG, CHOLHDL, LDLDIRECT in the last 72 hours. Thyroid function studies Recent Labs    08/12/21 1927  TSH 1.757   Anemia work up No results for input(s): VITAMINB12, FOLATE, FERRITIN, TIBC, IRON, RETICCTPCT in the last 72 hours. Urinalysis    Component Value Date/Time   COLORURINE YELLOW 08/12/2021 2237   APPEARANCEUR CLEAR 08/12/2021 2237   LABSPEC 1.027 08/12/2021 2237   PHURINE 5.0 08/12/2021 2237   GLUCOSEU 50 (A) 08/12/2021 2237   HGBUR NEGATIVE 08/12/2021 2237   BILIRUBINUR NEGATIVE 08/12/2021 2237   KETONESUR 5 (A) 08/12/2021 2237   PROTEINUR NEGATIVE 08/12/2021 2237   NITRITE NEGATIVE 08/12/2021 2237   LEUKOCYTESUR NEGATIVE 08/12/2021 2237   Sepsis Labs Invalid input(s): PROCALCITONIN,  WBC,  LACTICIDVEN Microbiology Recent Results (from the past 240 hour(s))  Blood Culture (routine x 2)     Status: None (Preliminary result)   Collection Time: 08/12/21  7:22 PM   Specimen: BLOOD RIGHT HAND  Result Value Ref Range Status   Specimen Description BLOOD RIGHT HAND  Final   Special Requests   Final    BOTTLES  DRAWN AEROBIC AND ANAEROBIC Blood Culture adequate volume   Culture   Final    NO GROWTH < 24 HOURS Performed at [facility], [address] [place], [place] [address]    Report Status PENDING  Incomplete  Blood Culture (routine x 2)     Status: None (Preliminary result)   Collection Time: 08/12/21  7:22 PM   Specimen: BLOOD LEFT WRIST  Result Value Ref Range Status   Specimen Description BLOOD LEFT WRIST  Final   Special Requests   Final    BOTTLES DRAWN AEROBIC AND ANAEROBIC Blood Culture adequate volume   Culture   Final    NO GROWTH < 24 HOURS Performed at [facility], [address] [place], [place] [address]    Report Status PENDING  Incomplete  Urine Culture     Status: None   Collection Time: 08/12/21 10:37 PM   Specimen: Urine, Clean Catch  Result Value Ref Range Status   Specimen Description   Final    URINE, CLEAN CATCH Performed at [facility], [address] [place], [place] [address]    Special Requests   Final    NONE Performed at [facility], [address], [place], [place] [address]    Culture   Final    NO GROWTH Performed at [facility], [place] [address], [place],  [address]    Report Status 08/14/2021 FINAL  Final  Resp Panel by RT-PCR (Flu A&B, Covid) Nasopharyngeal Swab     Status: None   Collection Time: 08/13/21  3:48 AM   Specimen: Nasopharyngeal Swab; Nasopharyngeal(NP) swabs in vial transport medium  Result Value Ref Range Status   SARS Coronavirus 2 by RT PCR NEGATIVE NEGATIVE Final    Comment: (NOTE) SARS-CoV-2 target nucleic acids are NOT DETECTED.  The SARS-CoV-2 RNA is generally detectable in upper respiratory specimens during the acute phase of infection. The lowest concentration of SARS-CoV-2 viral copies this assay can detect is 138 copies/mL. A negative result does not preclude SARS-Cov-2 infection and should not be used as the sole basis for treatment or other patient management decisions. A negative result may occur  with  improper specimen collection/handling, submission of specimen other than nasopharyngeal swab, presence of viral mutation(s) within the areas targeted by this assay, and inadequate number  of viral copies(<138 copies/mL). A negative result must be combined with clinical observations, patient history, and epidemiological information. The expected result is Negative.  Fact Sheet for Patients:  EntrepreneurPulse.com.au  Fact Sheet for Healthcare Providers:  IncredibleEmployment.be  This test is no t yet approved or cleared by the [place] FDA and  has been authorized for detection and/or diagnosis of SARS-CoV-2 by FDA under an Emergency Use Authorization (EUA). This EUA will remain  in effect (meaning this test can be used) for the duration of the COVID-19 declaration under Section 564(b)(1) of the Act, 21 U.S.C.section 360bbb-3(b)(1), unless the authorization is terminated  or revoked sooner.       Influenza A by PCR NEGATIVE NEGATIVE Final   Influenza B by PCR NEGATIVE NEGATIVE Final    Comment: (NOTE) The Xpert Xpress SARS-CoV-2/FLU/RSV plus assay is intended as an aid in the diagnosis of influenza from Nasopharyngeal swab specimens and should not be used as a sole basis for treatment. Nasal washings and aspirates are unacceptable for Xpert Xpress SARS-CoV-2/FLU/RSV testing.  Fact Sheet for Patients: EntrepreneurPulse.com.au  Fact Sheet for Healthcare Providers: IncredibleEmployment.be  This test is not yet approved or cleared by the [place] FDA and has been authorized for detection and/or diagnosis of SARS-CoV-2 by FDA under an Emergency Use Authorization (EUA). This EUA will remain in effect (meaning this test can be used) for the duration of the COVID-19 declaration under Section 564(b)(1) of the Act, 21 U.S.C. section 360bbb-3(b)(1), unless the authorization is terminated  or revoked.  Performed at [facility], [address], [place], [place] [address]    Time coordinating discharge:   SIGNED:  Irwin Brakeman, MD  [facility] 08/14/2021, 10:42 AM How to contact the [facility] Attending or Consulting provider [place] or covering provider during after hours [place], for this patient?  Check the care team in [facility] and look for a) attending/consulting TRH provider listed and b) the [facility] team listed Log into [email or web address] and use [facility]'s universal password to access. If you do not have the password, please contact the hospital operator. Locate the [facility] provider you are looking for under [facility] and page to a number that you can be directly reached. If you still have difficulty reaching the provider, please page the [facility] (Director on Call) for the Hospitalists listed on amion for assistance.

## 2021-08-14 NOTE — TOC Transition Note (Signed)
Transition of Care (TOC) - CM/SW Discharge Note ? ? ?Patient Details  ?Name: Jacob Little ?MRN: [id number] ?Date of Birth: 11-24-1935 ? ?Transition of Care (TOC) CM/SW Contact:  ?Shade Flood, LCSW ?Phone Number: ?08/14/2021, 11:30 AM ? ? ?Clinical Narrative:    ? ?Pt stable for dc per MD. Updated [facility] staff. They will transport pt. Updated pt's daughter who is in agreement with plan for dc back to [facility] with ALF transportation.  ? ?There are no other TOC needs for dc. ? ?Final next level of care: Assisted Living ?Barriers to Discharge: Barriers Resolved ? ? ?Patient Goals and CMS Choice ?Patient states their goals for this hospitalization and ongoing recovery are:: Return to ALF ?CMS Medicare.gov Compare Post Acute Care list provided to:: Patient Represenative (must comment) ?  ? ?Discharge Placement ?  ?           ?  ?  ?  ?  ? ?Discharge Plan and Services ?In-house Referral: Clinical Social Work ?  ?Post Acute Care Choice: Resumption of Svcs/PTA Provider          ?  ?  ?  ?  ?  ?  ?  ?  ?  ?  ? ?Social Determinants of Health (SDOH) Interventions ?  ? ? ?Readmission Risk Interventions ?Readmission Risk Prevention Plan 08/05/2021  ?Transportation Screening Complete  ?PCP or Specialist Appt within 5-7 Days Complete  ?Home Care Screening Complete  ?Medication Review (RN CM) Complete  ?Some recent data might be hidden  ? ? ? ? ? ?

## 2021-08-14 NOTE — Discharge Instructions (Signed)
IMPORTANT INFORMATION: PAY CLOSE ATTENTION   PHYSICIAN DISCHARGE INSTRUCTIONS  Follow with Primary care provider  Fagan, Roy, MD  and other consultants as instructed by your Hospitalist Physician  SEEK MEDICAL CARE OR RETURN TO EMERGENCY ROOM IF SYMPTOMS COME BACK, WORSEN OR NEW PROBLEM DEVELOPS   Please note: You were cared for by a hospitalist during your hospital stay. Every effort will be made to forward records to your primary care provider.  You can request that your primary care provider send for your hospital records if they have not received them.  Once you are discharged, your primary care physician will handle any further medical issues. Please note that NO REFILLS for any discharge medications will be authorized once you are discharged, as it is imperative that you return to your primary care physician (or establish a relationship with a primary care physician if you do not have one) for your post hospital discharge needs so that they can reassess your need for medications and monitor your lab values.  Please get a complete blood count and chemistry panel checked by your Primary MD at your next visit, and again as instructed by your Primary MD.  Get Medicines reviewed and adjusted: Please take all your medications with you for your next visit with your Primary MD  Laboratory/radiological data: Please request your Primary MD to go over all hospital tests and procedure/radiological results at the follow up, please ask your primary care provider to get all Hospital records sent to his/her office.  In some cases, they will be blood work, cultures and biopsy results pending at the time of your discharge. Please request that your primary care provider follow up on these results.  If you are diabetic, please bring your blood sugar readings with you to your follow up appointment with primary care.    Please call and make your follow up appointments as soon as possible.    Also Note the  following: If you experience worsening of your admission symptoms, develop shortness of breath, life threatening emergency, suicidal or homicidal thoughts you must seek medical attention immediately by calling 911 or calling your MD immediately  if symptoms less severe.  You must read complete instructions/literature along with all the possible adverse reactions/side effects for all the Medicines you take and that have been prescribed to you. Take any new Medicines after you have completely understood and accpet all the possible adverse reactions/side effects.   Do not drive when taking Pain medications or sleeping medications (Benzodiazepines)  Do not take more than prescribed Pain, Sleep and Anxiety Medications. It is not advisable to combine anxiety,sleep and pain medications without talking with your primary care practitioner  Special Instructions: If you have smoked or chewed Tobacco  in the last 2 yrs please stop smoking, stop any regular Alcohol  and or any Recreational drug use.  Wear Seat belts while driving.  Do not drive if taking any narcotic, mind altering or controlled substances or recreational drugs or alcohol.       

## 2021-08-14 NOTE — Progress Notes (Signed)
Patient very agitated and confused when he woke up this morning. IV leaking but patient combative and unable to check IV site. MD ordered 1mg  IM haldol. Patient got up to [facility] and had BM but still yelling at staff. He is now sleeping.  ?

## 2021-08-14 NOTE — [place] FL2 (Deleted)
?[place] MEDICAID FL2 LEVEL OF CARE SCREENING TOOL  ?  ? ?IDENTIFICATION  ?Patient Name: ?Jacob Little Birthdate: 1936-05-15 Sex: male Admission Date (Current Location): ?08/12/2021  ?[place] and [place] Number: ? [place] and Address:  ?[place] [address], [place] ?     Provider Number: ?[id number]  ?Attending Physician Name and Address:  ?Murlean Iba, MD ? Relative Name and Phone Number:  ?  ?   ?Current Level of Care: ?Hospital Recommended Level of Care: ?Assisted Living Facility Prior Approval Number: ?  ? ?Date Approved/Denied: ?  PASRR Number: ?  ? ?Discharge Plan: ?Domiciliary (Rest home) [facility]) ?  ? ?Current Diagnoses: ?Patient Active Problem List  ? Diagnosis Date Noted  ? History of stroke 08/12/2021  ? Hypothermia 08/12/2021  ? Acute encephalopathy 08/03/2021  ? Insulin dependent type 2 diabetes mellitus ([place]) 08/03/2021  ? Viral bronchitis 03/16/2018  ? Tremor of left hand 03/16/2018  ? 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 Acute metabolic encephalopathy 14/78/2956  ? Dehydration   ? Orthostatic hypotension   ? Generalized weakness 01/05/2018  ? Acute renal failure superimposed on stage 3 chronic kidney disease ([place]) 01/05/2018  ? Lactic acidosis 01/05/2018  ? Diarrhea 01/05/2018  ? Vascular dementia with behavior disturbance   ? Acute embolic stroke ([place]) 21/30/8657  ? Encephalopathy 11/08/2017  ? Acute ischemic stroke ([place]) 11/08/2017  ? Essential hypertension 11/08/2017  ? Diabetes mellitus with hyperglycemia, without long-term current use of insulin ([place]) 11/08/2017  ? Acute ischemic left MCA stroke ([place]) 11/08/2017  ? DENTAL PAIN 05/12/2010  ? CORONARY ATHEROSCLEROSIS NATIVE CORONARY ARTERY 07/22/2009  ? DM 07/12/2009  ? HLD (hyperlipidemia) 07/12/2009  ? HYPERTENSION 07/12/2009  ? ANGINA, ATYPICAL 07/12/2009  ? ? ?Orientation RESPIRATION BLADDER Height & Weight   ?  ?Self ?     Weight: 145 lb 8.1 oz (66 kg) ?Height:  5'  8" (172.7 cm)  ?BEHAVIORAL SYMPTOMS/MOOD NEUROLOGICAL BOWEL NUTRITION STATUS  ?      Diet (Reduced Concentrated Sweets)  ?AMBULATORY STATUS COMMUNICATION OF NEEDS Skin   ?Limited Assist Verbally   ?  ?  ?  ?    ?     ?     ? ? ?Personal Care Assistance Level of Assistance  ?Bathing, Feeding, Dressing Bathing Assistance: Limited assistance ?Feeding assistance: Independent ?Dressing Assistance: Limited assistance ?   ? ?Functional Limitations Info  ?Sight, Hearing, Speech Sight Info: Adequate ?Hearing Info: Adequate ?Speech Info: Adequate  ? ? ?SPECIAL CARE FACTORS FREQUENCY  ?    ?  ?  ?  ?  ?  ?  ?   ? ? ?Contractures Contractures Info: Not present  ? ? ?Additional Factors Info  ?Code Status, Allergies, Psychotropic Code Status Info: FULL Code ?Allergies Info: NKA ?Psychotropic Info: Risperdal ?  ?  ?   ? ?Current Medications (08/14/2021):  This is the current hospital active medication list ?Current Facility-Administered Medications  ?Medication Dose Route Frequency Provider Last Rate Last Admin  ? acetaminophen (TYLENOL) tablet 650 mg  650 mg Oral Q6H PRN Opyd, Ilene Qua, MD      ? Or  ? acetaminophen (TYLENOL) suppository 650 mg  650 mg Rectal Q6H PRN Opyd, Ilene Qua, MD      ? albuterol (VENTOLIN HFA) 108 (90 Base) MCG/ACT inhaler 2 puff  2 puff Inhalation Q6H PRN Opyd, Ilene Qua, MD      ? aspirin EC tablet 81  mg  81 mg Oral Daily Opyd, Ilene Qua, MD   81 mg at 08/13/21 1012  ? atorvastatin (LIPITOR) tablet 20 mg  20 mg Oral Daily Opyd, Ilene Qua, MD   20 mg at 08/13/21 1011  ? enoxaparin (LOVENOX) injection 40 mg  40 mg Subcutaneous Q24H Opyd, Ilene Qua, MD   40 mg at 08/13/21 1017  ? haloperidol lactate (HALDOL) injection 1 mg  1 mg Intramuscular Q6H PRN Wynetta Emery, Clanford L, MD   1 mg at 08/14/21 0820  ? isosorbide mononitrate (IMDUR) 24 hr tablet 15 mg  15 mg Oral BID Opyd, Ilene Qua, MD   15 mg at 08/13/21 2101  ? metoprolol tartrate (LOPRESSOR) tablet 12.5 mg  12.5 mg Oral BID Opyd, Ilene Qua, MD   12.5 mg  at 08/13/21 2100  ? ondansetron (ZOFRAN) tablet 4 mg  4 mg Oral Q6H PRN Opyd, Ilene Qua, MD      ? Or  ? ondansetron (ZOFRAN) injection 4 mg  4 mg Intravenous Q6H PRN Opyd, Ilene Qua, MD      ? risperiDONE (RISPERDAL) tablet 0.5 mg  0.5 mg Oral BID Opyd, Ilene Qua, MD   0.5 mg at 08/13/21 2102  ? senna-docusate (Senokot-S) tablet 1 tablet  1 tablet Oral QHS PRN Opyd, Ilene Qua, MD      ? sodium chloride flush (NS) 0.9 % injection 3 mL  3 mL Intravenous Q12H Opyd, Ilene Qua, MD   3 mL at 08/13/21 2102  ? ? ? ?Discharge Medications: ? ?STOP taking these medications   ?  ?glimepiride 1 MG tablet ?Commonly known as: Amaryl ?   ?guaiFENesin 600 MG 12 hr tablet ?Commonly known as: [place] ?   ?insulin glargine 100 UNIT/ML injection ?Commonly known as: LANTUS ?   ?ondansetron 8 MG disintegrating tablet ?Commonly known as: Zofran ODT ?   ?  ?   ?  ?TAKE these medications   ?  ?acetaminophen 325 MG tablet ?Commonly known as: TYLENOL ?Take 2 tablets (650 mg total) by mouth every 4 (four) hours as needed for mild pain (or temp > 37.5 C (99.5 F)). ?   ?albuterol 108 (90 Base) MCG/ACT inhaler ?Commonly known as: VENTOLIN HFA ?Inhale 2 puffs into the lungs every 6 (six) hours as needed for wheezing or shortness of breath. ?   ?Aspirin Low Dose 81 MG EC tablet ?Generic drug: aspirin ?TAKE (1) TABLET BY MOUTH ONCE DAILY. ?What changed: See the new instructions. ?   ?atorvastatin 20 MG tablet ?Commonly known as: LIPITOR ?Take 20 mg by mouth daily. ?   ?cyanocobalamin 1000 MCG/ML injection ?Commonly known as: (VITAMIN B-12) ?Inject 1,000 mcg into the muscle every 30 (thirty) days. ?   ?ipratropium-albuterol 0.5-2.5 (3) MG/3ML Soln ?Commonly known as: DUONEB ?Take 3 mLs by nebulization 2 (two) times daily. ?   ?isosorbide mononitrate 30 MG 24 hr tablet ?Commonly known as: IMDUR ?Take 0.5 tablets (15 mg total) by mouth 2 (two) times daily. ?   ?Januvia 100 MG tablet ?Generic drug: sitaGLIPtin ?Take 1 tablet by mouth daily. ?    ?metoprolol tartrate 25 MG tablet ?Commonly known as: LOPRESSOR ?Take 0.5 tablets (12.5 mg total) by mouth 2 (two) times daily. ?   ?nitroGLYCERIN 0.4 MG SL tablet ?Commonly known as: NITROSTAT ?Place 0.4 mg under the tongue every 5 (five) minutes as needed for chest pain. ?   ?risperiDONE 0.5 MG tablet ?Commonly known as: RISPERDAL ?Take 0.5 mg by mouth 2 (two) times daily. ?   ?  TUSSIN DM PO ?Take 5 mLs by mouth every 6 (six) hours as needed (cough/congestion). ?   ? ? ?Relevant Imaging Results: ? ?Relevant Lab Results: ? ? ?Additional Information ?SSN: [id number] ? ?Shade Flood, LCSW ? ? ? ? ?

## 2021-08-14 NOTE — [place] FL2 (Signed)
?[place] MEDICAID FL2 LEVEL OF CARE SCREENING TOOL  ?  ? ?IDENTIFICATION  ?Patient Name: ?Jacob Little Birthdate: Jun 15, 1936 Sex: male Admission Date (Current Location): ?08/12/2021  ?[place] and [place] Number: ? [place] and Address:  ?[facility] [address], [place] ?     Provider Number: ?[id number]  ?Attending Physician Name and Address:  ?Murlean Iba, MD ? Relative Name and Phone Number:  ?  ?   ?Current Level of Care: ?Hospital Recommended Level of Care: ?Assisted Living Facility Prior Approval Number: ?  ? ?Date Approved/Denied: ?  PASRR Number: ?  ? ?Discharge Plan: ?Domiciliary (Rest home) [facility]) ?  ? ?Current Diagnoses: ?Patient Active Problem List  ? Diagnosis Date Noted  ? History of stroke 08/12/2021  ? Hypothermia 08/12/2021  ? Acute encephalopathy 08/03/2021  ? Insulin dependent type 2 diabetes mellitus ([place]) 08/03/2021  ? Viral bronchitis 03/16/2018  ? Tremor of left hand 03/16/2018  ? Diabetes 1.5, managed as type 2 ([place]) 03/14/2018  ? Acute metabolic encephalopathy 27/11/2374  ? Dehydration   ? Orthostatic hypotension   ? Generalized weakness 01/05/2018  ? Acute renal failure superimposed on stage 3 chronic kidney disease ([place]) 01/05/2018  ? Lactic acidosis 01/05/2018  ? Diarrhea 01/05/2018  ? Vascular dementia with behavior disturbance   ? Acute embolic stroke ([place]) 28/31/5176  ? Encephalopathy 11/08/2017  ? Acute ischemic stroke ([place]) 11/08/2017  ? Essential hypertension 11/08/2017  ? Diabetes mellitus with hyperglycemia, without long-term current use of insulin ([place]) 11/08/2017  ? Acute ischemic left MCA stroke ([place]) 11/08/2017  ? DENTAL PAIN 05/12/2010  ? CORONARY ATHEROSCLEROSIS NATIVE CORONARY ARTERY 07/22/2009  ? DM 07/12/2009  ? HLD (hyperlipidemia) 07/12/2009  ? HYPERTENSION 07/12/2009  ? ANGINA, ATYPICAL 07/12/2009  ? ? ?Orientation RESPIRATION BLADDER Height & Weight   ?  ?Self ?     Weight: 145 lb 8.1 oz (66 kg) ?Height:  5'  8" (172.7 cm)  ?BEHAVIORAL SYMPTOMS/MOOD NEUROLOGICAL BOWEL NUTRITION STATUS  ?      Diet (Reduced Concentrated Sweets)  ?AMBULATORY STATUS COMMUNICATION OF NEEDS Skin   ?Limited Assist Verbally   ?  ?  ?  ?    ?     ?     ? ? ?Personal Care Assistance Level of Assistance  ?Bathing, Feeding, Dressing Bathing Assistance: Limited assistance ?Feeding assistance: Independent ?Dressing Assistance: Limited assistance ?   ? ?Functional Limitations Info  ?Sight, Hearing, Speech Sight Info: Adequate ?Hearing Info: Adequate ?Speech Info: Adequate  ? ? ?SPECIAL CARE FACTORS FREQUENCY  ?PT (By licensed PT) (Home Health RN)   ?  ?PT Frequency: 3x/week ?  ?  ?  ?  ?   ? ? ?Contractures Contractures Info: Not present  ? ? ?Additional Factors Info  ?Code Status, Allergies, Psychotropic Code Status Info: FULL Code ?Allergies Info: NKA ?Psychotropic Info: Risperdal ?  ?  ?   ? ?Current Medications (08/14/2021):  This is the current hospital active medication list ?Current Facility-Administered Medications  ?Medication Dose Route Frequency Provider Last Rate Last Admin  ? acetaminophen (TYLENOL) tablet 650 mg  650 mg Oral Q6H PRN Opyd, Ilene Qua, MD      ? Or  ? acetaminophen (TYLENOL) suppository 650 mg  650 mg Rectal Q6H PRN Opyd, Ilene Qua, MD      ? albuterol (VENTOLIN HFA) 108 (90 Base) MCG/ACT inhaler 2 puff  2 puff Inhalation Q6H PRN Opyd, Ilene Qua, MD      ?  aspirin EC tablet 81 mg  81 mg Oral Daily Opyd, Ilene Qua, MD   81 mg at 08/14/21 1144  ? atorvastatin (LIPITOR) tablet 20 mg  20 mg Oral Daily Opyd, Ilene Qua, MD   20 mg at 08/14/21 1143  ? enoxaparin (LOVENOX) injection 40 mg  40 mg Subcutaneous Q24H Opyd, Ilene Qua, MD   40 mg at 08/14/21 1147  ? haloperidol lactate (HALDOL) injection 1 mg  1 mg Intramuscular Q6H PRN Wynetta Emery, Clanford L, MD   1 mg at 08/14/21 0820  ? isosorbide mononitrate (IMDUR) 24 hr tablet 15 mg  15 mg Oral BID Opyd, Ilene Qua, MD   15 mg at 08/14/21 1142  ? metoprolol tartrate (LOPRESSOR) tablet  12.5 mg  12.5 mg Oral BID Opyd, Ilene Qua, MD   12.5 mg at 08/14/21 1150  ? ondansetron (ZOFRAN) tablet 4 mg  4 mg Oral Q6H PRN Opyd, Ilene Qua, MD      ? Or  ? ondansetron (ZOFRAN) injection 4 mg  4 mg Intravenous Q6H PRN Opyd, Ilene Qua, MD      ? risperiDONE (RISPERDAL) tablet 0.5 mg  0.5 mg Oral BID Opyd, Ilene Qua, MD   0.5 mg at 08/14/21 1143  ? senna-docusate (Senokot-S) tablet 1 tablet  1 tablet Oral QHS PRN Opyd, Ilene Qua, MD      ? sodium chloride flush (NS) 0.9 % injection 3 mL  3 mL Intravenous Q12H Opyd, Ilene Qua, MD   3 mL at 08/13/21 2102  ? ? ? ?Discharge Medications: ? ?STOP taking these medications   ?  ?glimepiride 1 MG tablet ?Commonly known as: Amaryl ?   ?guaiFENesin 600 MG 12 hr tablet ?Commonly known as: [place] ?   ?insulin glargine 100 UNIT/ML injection ?Commonly known as: LANTUS ?   ?ondansetron 8 MG disintegrating tablet ?Commonly known as: Zofran ODT ?   ?  ?   ?  ?TAKE these medications   ?  ?acetaminophen 325 MG tablet ?Commonly known as: TYLENOL ?Take 2 tablets (650 mg total) by mouth every 4 (four) hours as needed for mild pain (or temp > 37.5 C (99.5 F)). ?   ?albuterol 108 (90 Base) MCG/ACT inhaler ?Commonly known as: VENTOLIN HFA ?Inhale 2 puffs into the lungs every 6 (six) hours as needed for wheezing or shortness of breath. ?   ?Aspirin Low Dose 81 MG EC tablet ?Generic drug: aspirin ?TAKE (1) TABLET BY MOUTH ONCE DAILY. ?What changed: See the new instructions. ?   ?atorvastatin 20 MG tablet ?Commonly known as: LIPITOR ?Take 20 mg by mouth daily. ?   ?cyanocobalamin 1000 MCG/ML injection ?Commonly known as: (VITAMIN B-12) ?Inject 1,000 mcg into the muscle every 30 (thirty) days. ?   ?ipratropium-albuterol 0.5-2.5 (3) MG/3ML Soln ?Commonly known as: DUONEB ?Take 3 mLs by nebulization 2 (two) times daily. ?   ?isosorbide mononitrate 30 MG 24 hr tablet ?Commonly known as: IMDUR ?Take 0.5 tablets (15 mg total) by mouth 2 (two) times daily. ?   ?Januvia 100 MG tablet ?Generic drug:  sitaGLIPtin ?Take 1 tablet by mouth daily. ?   ?metoprolol tartrate 25 MG tablet ?Commonly known as: LOPRESSOR ?Take 0.5 tablets (12.5 mg total) by mouth 2 (two) times daily. ?   ?nitroGLYCERIN 0.4 MG SL tablet ?Commonly known as: NITROSTAT ?Place 0.4 mg under the tongue every 5 (five) minutes as needed for chest pain. ?   ?risperiDONE 0.5 MG tablet ?Commonly known as: RISPERDAL ?Take 0.5 mg by mouth 2 (two) times  daily. ?   ?TUSSIN DM PO ?Take 5 mLs by mouth every 6 (six) hours as needed (cough/congestion). ?   ? ? ?Relevant Imaging Results: ? ?Relevant Lab Results: ? ? ?Additional Information ?HH PT and RN ? ?Shade Flood, LCSW ? ? ? ? ?

## 2021-08-15 DIAGNOSIS — G9340 Encephalopathy, unspecified: Secondary | ICD-10-CM | POA: Diagnosis not present

## 2021-08-15 DIAGNOSIS — Z7984 Long term (current) use of oral hypoglycemic drugs: Secondary | ICD-10-CM | POA: Diagnosis not present

## 2021-08-15 DIAGNOSIS — I2510 Atherosclerotic heart disease of native coronary artery without angina pectoris: Secondary | ICD-10-CM | POA: Diagnosis not present

## 2021-08-15 DIAGNOSIS — I10 Essential (primary) hypertension: Secondary | ICD-10-CM | POA: Diagnosis not present

## 2021-08-15 DIAGNOSIS — E162 Hypoglycemia, unspecified: Secondary | ICD-10-CM | POA: Diagnosis not present

## 2021-08-15 DIAGNOSIS — E119 Type 2 diabetes mellitus without complications: Secondary | ICD-10-CM | POA: Diagnosis not present

## 2021-08-15 DIAGNOSIS — Z7982 Long term (current) use of aspirin: Secondary | ICD-10-CM | POA: Diagnosis not present

## 2021-08-15 DIAGNOSIS — R5381 Other malaise: Secondary | ICD-10-CM | POA: Diagnosis not present

## 2021-08-15 DIAGNOSIS — F03911 Unspecified dementia, unspecified severity, with agitation: Secondary | ICD-10-CM | POA: Diagnosis not present

## 2021-08-15 DIAGNOSIS — T68XXXA Hypothermia, initial encounter: Secondary | ICD-10-CM | POA: Diagnosis not present

## 2021-08-15 DIAGNOSIS — E785 Hyperlipidemia, unspecified: Secondary | ICD-10-CM | POA: Diagnosis not present

## 2021-08-18 DIAGNOSIS — F0150 Vascular dementia without behavioral disturbance: Secondary | ICD-10-CM | POA: Diagnosis not present

## 2021-08-18 DIAGNOSIS — G9340 Encephalopathy, unspecified: Secondary | ICD-10-CM | POA: Diagnosis not present

## 2021-08-18 LAB — CULTURE, BLOOD (ROUTINE X 2)
Culture: NO GROWTH
Culture: NO GROWTH
Special Requests: ADEQUATE
Special Requests: ADEQUATE

## 2021-09-16 DIAGNOSIS — F03911 Unspecified dementia, unspecified severity, with agitation: Secondary | ICD-10-CM | POA: Diagnosis not present

## 2021-09-16 DIAGNOSIS — I10 Essential (primary) hypertension: Secondary | ICD-10-CM | POA: Diagnosis not present

## 2021-09-16 DIAGNOSIS — Z7982 Long term (current) use of aspirin: Secondary | ICD-10-CM | POA: Diagnosis not present

## 2021-09-16 DIAGNOSIS — E785 Hyperlipidemia, unspecified: Secondary | ICD-10-CM | POA: Diagnosis not present

## 2021-09-16 DIAGNOSIS — E162 Hypoglycemia, unspecified: Secondary | ICD-10-CM | POA: Diagnosis not present

## 2021-09-16 DIAGNOSIS — G9340 Encephalopathy, unspecified: Secondary | ICD-10-CM | POA: Diagnosis not present

## 2021-09-16 DIAGNOSIS — Z7984 Long term (current) use of oral hypoglycemic drugs: Secondary | ICD-10-CM | POA: Diagnosis not present

## 2021-09-16 DIAGNOSIS — E119 Type 2 diabetes mellitus without complications: Secondary | ICD-10-CM | POA: Diagnosis not present

## 2021-09-16 DIAGNOSIS — T68XXXA Hypothermia, initial encounter: Secondary | ICD-10-CM | POA: Diagnosis not present

## 2021-09-16 DIAGNOSIS — I2510 Atherosclerotic heart disease of native coronary artery without angina pectoris: Secondary | ICD-10-CM | POA: Diagnosis not present

## 2021-09-30 DIAGNOSIS — E1129 Type 2 diabetes mellitus with other diabetic kidney complication: Secondary | ICD-10-CM | POA: Diagnosis not present

## 2021-10-07 DIAGNOSIS — R7309 Other abnormal glucose: Secondary | ICD-10-CM | POA: Diagnosis not present

## 2021-10-07 DIAGNOSIS — E1122 Type 2 diabetes mellitus with diabetic chronic kidney disease: Secondary | ICD-10-CM | POA: Diagnosis not present

## 2021-10-07 DIAGNOSIS — F0150 Vascular dementia without behavioral disturbance: Secondary | ICD-10-CM | POA: Diagnosis not present

## 2021-10-10 ENCOUNTER — Inpatient Hospital Stay (HOSPITAL_COMMUNITY)
Admission: EM | Admit: 2021-10-10 | Discharge: 2021-10-14 | DRG: 871 | Disposition: A | Discharge status: Skilled Nursing Facility | Payer: PPO | Source: Skilled Nursing Facility | Attending: Family Medicine | Admitting: Family Medicine

## 2021-10-10 ENCOUNTER — Emergency Department (HOSPITAL_COMMUNITY): Payer: PPO

## 2021-10-10 DIAGNOSIS — E7800 Pure hypercholesterolemia, unspecified: Secondary | ICD-10-CM | POA: Diagnosis not present

## 2021-10-10 DIAGNOSIS — E1165 Type 2 diabetes mellitus with hyperglycemia: Secondary | ICD-10-CM | POA: Diagnosis present

## 2021-10-10 DIAGNOSIS — R531 Weakness: Secondary | ICD-10-CM | POA: Diagnosis not present

## 2021-10-10 DIAGNOSIS — Z955 Presence of coronary angioplasty implant and graft: Secondary | ICD-10-CM

## 2021-10-10 DIAGNOSIS — R4182 Altered mental status, unspecified: Secondary | ICD-10-CM | POA: Diagnosis not present

## 2021-10-10 DIAGNOSIS — J984 Other disorders of lung: Secondary | ICD-10-CM | POA: Diagnosis not present

## 2021-10-10 DIAGNOSIS — Z79899 Other long term (current) drug therapy: Secondary | ICD-10-CM | POA: Diagnosis not present

## 2021-10-10 DIAGNOSIS — Z66 Do not resuscitate: Secondary | ICD-10-CM | POA: Diagnosis present

## 2021-10-10 DIAGNOSIS — I959 Hypotension, unspecified: Secondary | ICD-10-CM | POA: Diagnosis not present

## 2021-10-10 DIAGNOSIS — R1310 Dysphagia, unspecified: Secondary | ICD-10-CM | POA: Diagnosis not present

## 2021-10-10 DIAGNOSIS — J189 Pneumonia, unspecified organism: Secondary | ICD-10-CM | POA: Diagnosis present

## 2021-10-10 DIAGNOSIS — Z7982 Long term (current) use of aspirin: Secondary | ICD-10-CM

## 2021-10-10 DIAGNOSIS — Z20822 Contact with and (suspected) exposure to covid-19: Secondary | ICD-10-CM | POA: Diagnosis not present

## 2021-10-10 DIAGNOSIS — I69898 Other sequelae of other cerebrovascular disease: Secondary | ICD-10-CM

## 2021-10-10 DIAGNOSIS — R069 Unspecified abnormalities of breathing: Secondary | ICD-10-CM | POA: Diagnosis not present

## 2021-10-10 DIAGNOSIS — R52 Pain, unspecified: Secondary | ICD-10-CM | POA: Diagnosis not present

## 2021-10-10 DIAGNOSIS — E785 Hyperlipidemia, unspecified: Secondary | ICD-10-CM | POA: Diagnosis present

## 2021-10-10 DIAGNOSIS — I10 Essential (primary) hypertension: Secondary | ICD-10-CM | POA: Diagnosis present

## 2021-10-10 DIAGNOSIS — I2510 Atherosclerotic heart disease of native coronary artery without angina pectoris: Secondary | ICD-10-CM | POA: Diagnosis present

## 2021-10-10 DIAGNOSIS — Z7984 Long term (current) use of oral hypoglycemic drugs: Secondary | ICD-10-CM

## 2021-10-10 DIAGNOSIS — E872 Acidosis, unspecified: Secondary | ICD-10-CM | POA: Diagnosis not present

## 2021-10-10 DIAGNOSIS — Z515 Encounter for palliative care: Secondary | ICD-10-CM | POA: Diagnosis not present

## 2021-10-10 DIAGNOSIS — Z7189 Other specified counseling: Secondary | ICD-10-CM | POA: Diagnosis not present

## 2021-10-10 DIAGNOSIS — R0902 Hypoxemia: Secondary | ICD-10-CM | POA: Diagnosis present

## 2021-10-10 DIAGNOSIS — F01518 Vascular dementia, unspecified severity, with other behavioral disturbance: Secondary | ICD-10-CM | POA: Diagnosis not present

## 2021-10-10 DIAGNOSIS — J690 Pneumonitis due to inhalation of food and vomit: Secondary | ICD-10-CM | POA: Diagnosis present

## 2021-10-10 DIAGNOSIS — M6281 Muscle weakness (generalized): Secondary | ICD-10-CM | POA: Diagnosis not present

## 2021-10-10 DIAGNOSIS — R0602 Shortness of breath: Secondary | ICD-10-CM | POA: Diagnosis not present

## 2021-10-10 DIAGNOSIS — E0865 Diabetes mellitus due to underlying condition with hyperglycemia: Secondary | ICD-10-CM | POA: Diagnosis not present

## 2021-10-10 DIAGNOSIS — A419 Sepsis, unspecified organism: Principal | ICD-10-CM | POA: Diagnosis present

## 2021-10-10 DIAGNOSIS — G9341 Metabolic encephalopathy: Secondary | ICD-10-CM | POA: Diagnosis not present

## 2021-10-10 DIAGNOSIS — Z87891 Personal history of nicotine dependence: Secondary | ICD-10-CM | POA: Diagnosis not present

## 2021-10-10 DIAGNOSIS — Z7401 Bed confinement status: Secondary | ICD-10-CM | POA: Diagnosis not present

## 2021-10-10 DIAGNOSIS — R293 Abnormal posture: Secondary | ICD-10-CM | POA: Diagnosis not present

## 2021-10-10 DIAGNOSIS — R5381 Other malaise: Secondary | ICD-10-CM | POA: Diagnosis not present

## 2021-10-10 LAB — RESP PANEL BY RT-PCR (FLU A&B, COVID) ARPGX2
Influenza A by PCR: NEGATIVE
Influenza B by PCR: NEGATIVE
SARS Coronavirus 2 by RT PCR: NEGATIVE

## 2021-10-10 LAB — CBC WITH DIFFERENTIAL/PLATELET
Abs Immature Granulocytes: 0.53 10*3/uL — ABNORMAL HIGH (ref 0.00–0.07)
Basophils Absolute: 0.1 10*3/uL (ref 0.0–0.1)
Basophils Relative: 0 %
Eosinophils Absolute: 0 10*3/uL (ref 0.0–0.5)
Eosinophils Relative: 0 %
HCT: 37.9 % — ABNORMAL LOW (ref 39.0–52.0)
Hemoglobin: 12.3 g/dL — ABNORMAL LOW (ref 13.0–17.0)
Immature Granulocytes: 2 %
Lymphocytes Relative: 6 %
Lymphs Abs: 1.6 10*3/uL (ref 0.7–4.0)
MCH: 31.4 pg (ref 26.0–34.0)
MCHC: 32.5 g/dL (ref 30.0–36.0)
MCV: 96.7 fL (ref 80.0–100.0)
Monocytes Absolute: 1.3 10*3/uL — ABNORMAL HIGH (ref 0.1–1.0)
Monocytes Relative: 5 %
Neutro Abs: 21.3 10*3/uL — ABNORMAL HIGH (ref 1.7–7.7)
Neutrophils Relative %: 87 %
Platelets: 326 10*3/uL (ref 150–400)
RBC: 3.92 MIL/uL — ABNORMAL LOW (ref 4.22–5.81)
RDW: 13.2 % (ref 11.5–15.5)
WBC: 24.8 10*3/uL — ABNORMAL HIGH (ref 4.0–10.5)
nRBC: 0 % (ref 0.0–0.2)

## 2021-10-10 LAB — COMPREHENSIVE METABOLIC PANEL
ALT: 54 U/L — ABNORMAL HIGH (ref 0–44)
AST: 42 U/L — ABNORMAL HIGH (ref 15–41)
Albumin: 2.9 g/dL — ABNORMAL LOW (ref 3.5–5.0)
Alkaline Phosphatase: 73 U/L (ref 38–126)
Anion gap: 7 (ref 5–15)
BUN: 34 mg/dL — ABNORMAL HIGH (ref 8–23)
CO2: 26 mmol/L (ref 22–32)
Calcium: 8.9 mg/dL (ref 8.9–10.3)
Chloride: 110 mmol/L (ref 98–111)
Creatinine, Ser: 1.1 mg/dL (ref 0.61–1.24)
GFR, Estimated: >60 mL/min (ref >60)
Glucose, Bld: 147 mg/dL — ABNORMAL HIGH (ref 70–99)
Potassium: 4.1 mmol/L (ref 3.5–5.1)
Sodium: 143 mmol/L (ref 135–145)
Total Bilirubin: 0.8 mg/dL (ref 0.3–1.2)
Total Protein: 6.6 g/dL (ref 6.5–8.1)

## 2021-10-10 LAB — URINALYSIS, ROUTINE W REFLEX MICROSCOPIC
Bacteria, UA: NONE SEEN
Bilirubin Urine: NEGATIVE
Glucose, UA: NEGATIVE mg/dL
Ketones, ur: NEGATIVE mg/dL
Leukocytes,Ua: NEGATIVE
Nitrite: NEGATIVE
Protein, ur: 30 mg/dL — AB
RBC / HPF: >50 RBC/hpf — ABNORMAL HIGH (ref 0–5)
Specific Gravity, Urine: 1.03 (ref 1.005–1.030)
pH: 5 (ref 5.0–8.0)

## 2021-10-10 LAB — LACTIC ACID, PLASMA
Lactic Acid, Venous: 3 mmol/L (ref 0.5–1.9)
Lactic Acid, Venous: 3 mmol/L (ref 0.5–1.9)

## 2021-10-10 LAB — CBG MONITORING, ED: Glucose-Capillary: 150 mg/dL — ABNORMAL HIGH (ref 70–99)

## 2021-10-10 LAB — PROTIME-INR
INR: 1.3 — ABNORMAL HIGH (ref 0.8–1.2)
Prothrombin Time: 16.3 seconds — ABNORMAL HIGH (ref 11.4–15.2)

## 2021-10-10 LAB — GLUCOSE, CAPILLARY: Glucose-Capillary: 168 mg/dL — ABNORMAL HIGH (ref 70–99)

## 2021-10-10 LAB — APTT: aPTT: 29 seconds (ref 24–36)

## 2021-10-10 MED ORDER — INSULIN ASPART 100 UNIT/ML IJ SOLN
0.0000 [IU] | INTRAMUSCULAR | Status: DC
  Administered 2021-10-11: 1 [IU] via SUBCUTANEOUS

## 2021-10-10 MED ORDER — SODIUM CHLORIDE 0.9 % IV SOLN
3.0000 g | Freq: Three times a day (TID) | INTRAVENOUS | Status: DC
  Administered 2021-10-10 – 2021-10-14 (×12): 3 g via INTRAVENOUS
  Filled 2021-10-10 (×13): qty 8

## 2021-10-10 MED ORDER — HALOPERIDOL LACTATE 5 MG/ML IJ SOLN
2.0000 mg | Freq: Four times a day (QID) | INTRAMUSCULAR | Status: DC | PRN
  Administered 2021-10-10 – 2021-10-11 (×2): 2 mg via INTRAVENOUS
  Filled 2021-10-10 (×2): qty 1

## 2021-10-10 MED ORDER — SODIUM CHLORIDE 0.9 % IV SOLN
500.0000 mg | INTRAVENOUS | Status: DC
  Administered 2021-10-10 – 2021-10-12 (×3): 500 mg via INTRAVENOUS
  Filled 2021-10-10 (×4): qty 5

## 2021-10-10 MED ORDER — ALBUTEROL SULFATE (2.5 MG/3ML) 0.083% IN NEBU
2.5000 mg | INHALATION_SOLUTION | RESPIRATORY_TRACT | Status: DC | PRN
  Administered 2021-10-11: 2.5 mg via RESPIRATORY_TRACT
  Filled 2021-10-10: qty 3

## 2021-10-10 MED ORDER — ENOXAPARIN SODIUM 40 MG/0.4ML IJ SOSY
40.0000 mg | PREFILLED_SYRINGE | INTRAMUSCULAR | Status: DC
  Administered 2021-10-10 – 2021-10-13 (×4): 40 mg via SUBCUTANEOUS
  Filled 2021-10-10 (×4): qty 0.4

## 2021-10-10 MED ORDER — LACTATED RINGERS IV SOLN: INTRAVENOUS | Status: AC

## 2021-10-10 MED ORDER — LACTATED RINGERS IV SOLN: INTRAVENOUS | Status: DC

## 2021-10-10 MED ORDER — IPRATROPIUM-ALBUTEROL 0.5-2.5 (3) MG/3ML IN SOLN
3.0000 mL | Freq: Once | RESPIRATORY_TRACT | Status: AC
  Administered 2021-10-10: 3 mL via RESPIRATORY_TRACT
  Filled 2021-10-10: qty 3

## 2021-10-10 MED ORDER — SODIUM CHLORIDE 0.9 % IV SOLN: 500.0000 mg | INTRAVENOUS | Status: DC

## 2021-10-10 MED ORDER — SODIUM CHLORIDE 0.9 % IV SOLN
2.0000 g | INTRAVENOUS | Status: DC
  Administered 2021-10-10: 2 g via INTRAVENOUS
  Filled 2021-10-10: qty 20

## 2021-10-10 NOTE — ED Notes (Signed)
Patient is restless attempting to remove breathing treatment.  ?

## 2021-10-10 NOTE — ED Triage Notes (Signed)
Patient brought in by [facility] from [facility] for shortness of breath.  EMS states that staff said patient has been congested since yesterday and having shortness of breath.  Also states that the patient has had multiple breathing treatments today without any relief.  Patients oxygen saturation in low 80s on room air with EMS.  EMS placed the patient on NRB and sat are 97%.  Staff also states that the patient is not acting like his normal self.  ?

## 2021-10-10 NOTE — Sepsis Progress Note (Signed)
Sepsis protocol monitored by eLink 

## 2021-10-10 NOTE — Progress Notes (Signed)
Pharmacy Antibiotic Note ? ?Jacob Little is a 86 y.o. male admitted on 10/10/2021 with pneumonia.  Pharmacy has been consulted for Unasyn dosing. ? ?Pt admitted from SNF due to increase difficulty breathing. CXR suspicious for PNA. Change abx to unasyn/zith per MD. ? ?Scr 1.1>>scr 48 ml/min ? ?Plan: ?Unasyn 3g IV q8 ?F/u with LOT and cultures ? ?Weight: 66 kg (145 lb 8.1 oz) ? ?Temp (24hrs), Avg:97.6 ?F (36.4 ?C), Min:97.6 ?F (36.4 ?C), Max:97.6 ?F (36.4 ?C) ? ?Recent Labs  ?Lab 10/10/21 ?1707  ?WBC 24.8*  ?CREATININE 1.10  ?  ?Estimated Creatinine Clearance: 47.5 mL/min (by C-G formula based on SCr of 1.1 mg/dL).   ? ?No Known Allergies ? ?Antimicrobials this admission: ?Ceftriaxone 4/28x1 ?Unasyn 4/28>> ?Azith 4/28>>5/2 ? ?Dose adjustments this admission: ? ? ?Microbiology results: ?4/28 blood>> ?4/28 urine>> ? ?Onnie Boer, PharmD, BCIDP, AAHIVP, CPP ?Infectious Disease Pharmacist ?10/10/2021 6:15 PM ? ? ? ?

## 2021-10-10 NOTE — ED Provider Notes (Signed)
?[facility] ?Provider Note ? ? ?CSN: [id number] ?Arrival date & time: 10/10/21  1647 ? ?  ? ?History ? ?Chief Complaint  ?Patient presents with  ? Shortness of Breath  ? ? ?KAYIN OSMENT is a 86 y.o. male. ? ? ?Shortness of Breath ? ?This patient is an 86 year old male, he has a history of some reactive airway disease on albuterol, hypercholesterol on Lipitor, he is a diabetic on Januvia and hypertension on metoprolol.  He also takes Risperdal for some difficulty with cognition and agitation.  He has known to have vascular dementia and is currently residing at [facility].  He is arriving by EMS today for shortness of breath, found to be hypoxic in the low 80% range on room air, he was placed on a nonrebreather.  On arrival his oxygen is 90% on room air.  The staff at the facility state that he has been having some increasing difficulty breathing with increasing congestion in his lungs and over the last 2 weeks has had a progressive decline in his agitation and a progressive confusion compared to his baseline.  They are not more clear about what that means but states that he is not his normal self and it has been a gradual decline ? ?Home Medications ?Prior to Admission medications   ?Medication Sig Start Date End Date Taking? Authorizing Provider  ?acetaminophen (TYLENOL) 325 MG tablet Take 2 tablets (650 mg total) by mouth every 4 (four) hours as needed for mild pain (or temp > 37.5 C (99.5 F)). 11/12/17   Barton Dubois, MD  ?albuterol (PROVENTIL HFA;VENTOLIN HFA) 108 (90 Base) MCG/ACT inhaler Inhale 2 puffs into the lungs every 6 (six) hours as needed for wheezing or shortness of breath. 03/16/18   Kathie Dike, MD  ?ASPIRIN LOW DOSE 81 MG EC tablet TAKE (1) TABLET BY MOUTH ONCE DAILY. ?Patient taking differently: Take 81 mg by mouth daily. 09/20/20   Satira Sark, MD  ?atorvastatin (LIPITOR) 20 MG tablet Take 20 mg by mouth daily.    [provider]  ?cyanocobalamin (,VITAMIN  B-12,) 1000 MCG/ML injection Inject 1,000 mcg into the muscle every 30 (thirty) days.  09/14/17   [provider]  ?Dextromethorphan-guaiFENesin (TUSSIN DM PO) Take 5 mLs by mouth every 6 (six) hours as needed (cough/congestion).    [provider]  ?ipratropium-albuterol (DUONEB) 0.5-2.5 (3) MG/3ML SOLN Take 3 mLs by nebulization 2 (two) times daily. 08/04/21   Kathie Dike, MD  ?isosorbide mononitrate (IMDUR) 30 MG 24 hr tablet Take 0.5 tablets (15 mg total) by mouth 2 (two) times daily. 01/07/18 08/13/21  Barton Dubois, MD  ?JANUVIA 100 MG tablet Take 1 tablet by mouth daily. 11/04/18   [provider]  ?metoprolol tartrate (LOPRESSOR) 25 MG tablet Take 0.5 tablets (12.5 mg total) by mouth 2 (two) times daily. 11/12/17   Barton Dubois, MD  ?nitroGLYCERIN (NITROSTAT) 0.4 MG SL tablet Place 0.4 mg under the tongue every 5 (five) minutes as needed for chest pain.    [provider]  ?risperiDONE (RISPERDAL) 0.5 MG tablet Take 0.5 mg by mouth 2 (two) times daily. 07/25/21   [provider]  ?   ? ?Allergies    ?Patient has no known allergies.   ? ?Review of Systems   ?Review of Systems  ?Unable to perform ROS: Dementia  ?Respiratory:  Positive for shortness of breath.   ? ?Physical Exam ?Updated Vital Signs ?BP (!) 142/68   Pulse 84   Temp 97.6 ?  F (36.4 ?C) (Oral)   Resp (!) 21   SpO2 96%  ?Physical Exam ?Vitals and nursing note reviewed.  ?Constitutional:   ?   General: He is not in acute distress. ?   Appearance: He is well-developed. He is ill-appearing.  ?HENT:  ?   Head: Normocephalic and atraumatic.  ?   Mouth/Throat:  ?   Mouth: Mucous membranes are moist.  ?   Pharynx: No oropharyngeal exudate.  ?Eyes:  ?   General: No scleral icterus.    ?   Right eye: No discharge.     ?   Left eye: No discharge.  ?   Conjunctiva/sclera: Conjunctivae normal.  ?   Pupils: Pupils are equal, round, and reactive to light.  ?Neck:  ?   Thyroid: No thyromegaly.  ?   Vascular: No JVD.   ?Cardiovascular:  ?   Rate and Rhythm: Normal rate and regular rhythm.  ?   Heart sounds: Normal heart sounds. No murmur heard. ?  No friction rub. No gallop.  ?Pulmonary:  ?   Effort: Respiratory distress present.  ?   Breath sounds: Rhonchi and rales present. No wheezing.  ?Abdominal:  ?   General: Bowel sounds are normal. There is no distension.  ?   Palpations: Abdomen is soft. There is no mass.  ?   Tenderness: There is no abdominal tenderness.  ?Musculoskeletal:     ?   General: No tenderness. Normal range of motion.  ?   Cervical back: Normal range of motion and neck supple.  ?Lymphadenopathy:  ?   Cervical: No cervical adenopathy.  ?Skin: ?   General: Skin is warm and dry.  ?   Findings: No erythema or rash.  ?Neurological:  ?   Mental Status: He is alert.  ?   Coordination: Coordination normal.  ?   Comments: The seems calm, he is awake, he has some difficulty following commands, he does not answer questions, essentially not talking at all.  Cranial nerves III through XII appear to be normal without any facial droop, he does track me from side to side across the room.  ?Psychiatric:     ?   Behavior: Behavior normal.  ? ? ?ED Results / Procedures / Treatments   ?Labs ?(all labs ordered are listed, but only abnormal results are displayed) ?Labs Reviewed  ?COMPREHENSIVE METABOLIC PANEL - Abnormal; Notable for the following components:  ?    Result Value  ? Glucose, Bld 147 (*)   ? BUN 34 (*)   ? Albumin 2.9 (*)   ? AST 42 (*)   ? ALT 54 (*)   ? All other components within normal limits  ?CBC WITH DIFFERENTIAL/PLATELET - Abnormal; Notable for the following components:  ? WBC 24.8 (*)   ? RBC 3.92 (*)   ? Hemoglobin 12.3 (*)   ? HCT 37.9 (*)   ? Neutro Abs 21.3 (*)   ? Monocytes Absolute 1.3 (*)   ? Abs Immature Granulocytes 0.53 (*)   ? All other components within normal limits  ?PROTIME-INR - Abnormal; Notable for the following components:  ? Prothrombin Time 16.3 (*)   ? INR 1.3 (*)   ? All other components  within normal limits  ?URINALYSIS, ROUTINE W REFLEX MICROSCOPIC - Abnormal; Notable for the following components:  ? Color, Urine AMBER (*)   ? Hgb urine dipstick MODERATE (*)   ? Protein, ur 30 (*)   ? RBC / HPF >50 (*)   ?  All other components within normal limits  ?RESP PANEL BY RT-PCR (FLU A&B, COVID) ARPGX2  ?CULTURE, BLOOD (ROUTINE X 2)  ?CULTURE, BLOOD (ROUTINE X 2)  ?URINE CULTURE  ?APTT  ?LACTIC ACID, PLASMA  ?LACTIC ACID, PLASMA  ?CBG MONITORING, ED  ? ? ?EKG ?EKG Interpretation ? ?Date/Time:  Friday October 10 2021 16:55:38 EDT ?Ventricular Rate:  66 ?PR Interval:  146 ?QRS Duration: 132 ?QT Interval:  445 ?QTC Calculation: 467 ?R Axis:   -79 ?Text Interpretation: Sinus rhythm RBBB and LAFB since 08/12/21, has had no changes Confirmed by Noemi Chapel [phone]) on 10/10/2021 5:45:04 PM ? ?Radiology ?No results found. ? ?Procedures ?Marland KitchenCritical Care ?Performed by: Noemi Chapel, MD ?Authorized by: Noemi Chapel, MD  ? ?Critical care provider statement:  ?  Critical care time (minutes):  30 ?  Critical care time was exclusive of:  Separately billable procedures and treating other patients and teaching time ?  Critical care was necessary to treat or prevent imminent or life-threatening deterioration of the following conditions:  Sepsis ?  Critical care was time spent personally by me on the following activities:  Development of treatment plan with patient or surrogate, discussions with consultants, evaluation of patient's response to treatment, examination of patient, ordering and review of laboratory studies, ordering and review of radiographic studies, ordering and performing treatments and interventions, pulse oximetry, re-evaluation of patient's condition, review of old charts and obtaining history from patient or surrogate ?  I assumed direction of critical care for this patient from another provider in my specialty: no   ?  Care discussed with: admitting provider   ?Comments:  ?    ?  ? ? ?Medications Ordered  in ED ?Medications  ?lactated ringers infusion (has no administration in time range)  ?cefTRIAXone (ROCEPHIN) 2 g in sodium chloride 0.9 % 100 mL IVPB (has no administration in time range)  ?azithromycin

## 2021-10-10 NOTE — ED Notes (Addendum)
Pt continues to try to get up after redirecting him. Placed in trendelenburg in upright position.  ?

## 2021-10-10 NOTE — H&P (Signed)
? ?                                                                                                       TR[facility] H&P ? ? Patient Demographics:  ? ? Jacob Little, is a 86 y.o. male  MRN: [id number]   DOB - 03/15/1936 ? ?Admit Date - 10/10/2021 ? ?Outpatient Primary MD for the patient is [name], MD ? ?Referring MD/NP/PA: Dr Sabra Heck ? ?Patient coming from: [facility] ? ?Chief Complaint  ?Patient presents with  ? Shortness of Breath  ?  ? ? HPI:  ? ? Jacob Little  is a 86 y.o. male,with medical history significant for history of CVA, dementia, type 2 diabetes mellitus, patient with recent hospitalization in February secondary to sepsis from pneumonia(work-up with low risk of aspiration), and hypoglycemia last March, he was seen today by facility for shortness of breath. ?-Patient is poor historian, with dementia, unable to provide any history, it was obtained from ED staff, patient brought by EMS for shortness of breath, was hypoxic in low 80s on room air, he was placed on nonrebreather, on arrival his oxygen 90% on room air, he was noted by staff at the facility with difficulty breathing, increased congestion in his lungs for last 2 weeks, he had progressive decline since, and he is more agitated and confused compared to his baseline. ?-In ED chest x-ray significant for bilateral lung disease, with elevated lactic acid of 3, leukocytosis of 24.8, patient was started on pneumonia antibiotic coverage and [facility] hospitalist consulted to admit. ? ? Review of systems:  ?  ?unable to obtain appropriate review of systems due to delirium. ? ? ?With Past History of the following :  ? ? ?Past Medical History:  ?Diagnosis Date  ? Atherosclerotic heart disease   ? Cerebral embolism with cerebral infarction [facility])   ? From LTC paperwork  ? Coronary artery disease   ? DES to circumflex 2006  ? Dementia ([place])   ? Encephalopathy   ? Essential hypertension   ? Hyperlipidemia   ? Rheumatic fever   ? Stroke [facility])   ? Type 2 diabetes  mellitus ([place])   ?   ? ?Past Surgical History:  ?Procedure Laterality Date  ? APPENDECTOMY    ? CORONARY ANGIOPLASTY WITH STENT PLACEMENT    ? ? ? ? Social History:  ? ?  ?Social History  ? ?Tobacco Use  ? Smoking status: Former  ?  Types: Cigarettes  ?  Quit date: 06/15/2005  ?  Years since quitting: 16.3  ? Smokeless tobacco: Never  ?Substance Use Topics  ? Alcohol use: No  ?  Alcohol/week: 0.0 standard drinks  ?  ? ? ? ? Family History :  ? ?Family history was reviewed, nonpertinent ? Home Medications:  ? ?Prior to Admission medications   ?Medication Sig Start Date End Date Taking? Authorizing Provider  ?acetaminophen (TYLENOL) 325 MG tablet Take 2 tablets (650 mg total) by mouth every 4 (four) hours as needed for mild pain (or temp > 37.5 C (99.5 F)). 11/12/17  Barton Dubois, MD  ?albuterol (PROVENTIL HFA;VENTOLIN HFA) 108 (90 Base) MCG/ACT inhaler Inhale 2 puffs into the lungs every 6 (six) hours as needed for wheezing or shortness of breath. 03/16/18   Kathie Dike, MD  ?ASPIRIN LOW DOSE 81 MG EC tablet TAKE (1) TABLET BY MOUTH ONCE DAILY. ?Patient taking differently: Take 81 mg by mouth daily. 09/20/20   Satira Sark, MD  ?atorvastatin (LIPITOR) 20 MG tablet Take 20 mg by mouth daily.    [provider]  ?cyanocobalamin (,VITAMIN B-12,) 1000 MCG/ML injection Inject 1,000 mcg into the muscle every 30 (thirty) days.  09/14/17   [provider]  ?Dextromethorphan-guaiFENesin (TUSSIN DM PO) Take 5 mLs by mouth every 6 (six) hours as needed (cough/congestion).    [provider]  ?ipratropium-albuterol (DUONEB) 0.5-2.5 (3) MG/3ML SOLN Take 3 mLs by nebulization 2 (two) times daily. 08/04/21   Kathie Dike, MD  ?isosorbide mononitrate (IMDUR) 30 MG 24 hr tablet Take 0.5 tablets (15 mg total) by mouth 2 (two) times daily. 01/07/18 08/13/21  Barton Dubois, MD  ?JANUVIA 100 MG tablet Take 1 tablet by mouth daily. 11/04/18   [provider]  ?metoprolol tartrate (LOPRESSOR) 25 MG  tablet Take 0.5 tablets (12.5 mg total) by mouth 2 (two) times daily. 11/12/17   Barton Dubois, MD  ?nitroGLYCERIN (NITROSTAT) 0.4 MG SL tablet Place 0.4 mg under the tongue every 5 (five) minutes as needed for chest pain.    [provider]  ?risperiDONE (RISPERDAL) 0.5 MG tablet Take 0.5 mg by mouth 2 (two) times daily. 07/25/21   [provider]  ? ? ? Allergies:  ? ? No Known Allergies ? ? Physical Exam:  ? ?Vitals ? ?Blood pressure (!) 155/82, pulse 83, temperature 97.6 ?F (36.4 ?C), temperature source Oral, resp. rate (!) 21, SpO2 90 %. ? ? ?1. General, thin appearing elderly male, restless, trying to get out of bed ? ?2.  Fair judgment and insight, unable to answer questions or follow commands. ? ?3.  Strength grossly intact, no focal deficits appreciated plantars down going. ? ?4. Ears and Eyes appear Normal, Conjunctivae clear, PERRLA.  Dry oral Mucosa. ? ?5. Supple Neck, No JVD, No cervical lymphadenopathy appriciated, No Carotid Bruits. ? ?6. Symmetrical Chest wall movement, Good air movement bilaterally, scattered Rales at the bases ? ?7. RRR, No Gallops, Rubs or Murmurs, No Parasternal Heave. ? ?8. Positive Bowel Sounds, Abdomen Soft, No tenderness, No organomegaly appriciated,No rebound -guarding or rigidity. ? ?9.  No Cyanosis, Normal Skin Turgor, No Skin Rash or Bruise. ? ?10. Good muscle tone,  joints appear normal , no effusions, Normal ROM. ? ?11. No Palpable Lymph Nodes in Neck or Axillae ? ? ? Data Review:  ? ? CBC ?Recent Labs  ?Lab 10/10/21 ?1707  ?WBC 24.8*  ?HGB 12.3*  ?HCT 37.9*  ?PLT 326  ?MCV 96.7  ?MCH 31.4  ?MCHC 32.5  ?RDW 13.2  ?LYMPHSABS 1.6  ?MONOABS 1.3*  ?EOSABS 0.0  ?BASOSABS 0.1  ? ?------------------------------------------------------------------------------------------------------------------ ? ?Chemistries  ?Recent Labs  ?Lab 10/10/21 ?1707  ?NA 143  ?K 4.1  ?CL 110  ?CO2 26  ?GLUCOSE 147*  ?BUN 34*  ?CREATININE 1.10  ?CALCIUM 8.9  ?AST 42*  ?ALT 54*   ?ALKPHOS 73  ?BILITOT 0.8  ? ?------------------------------------------------------------------------------------------------------------------ ?CrCl cannot be calculated (Unknown ideal weight.). ?------------------------------------------------------------------------------------------------------------------ ?No results for input(s): TSH, T4TOTAL, T3FREE, THYROIDAB in the last 72 hours. ? ?Invalid input(s): FREET3 ? ?Coagulation profile ?Recent Labs  ?Lab 10/10/21 ?1707  ?  INR 1.3*  ? ?------------------------------------------------------------------------------------------------------------------- ?No results for input(s): DDIMER in the last 72 hours. ?------------------------------------------------------------------------------------------------------------------- ? ?Cardiac Enzymes ?No results for input(s): CKMB, TROPONINI, MYOGLOBIN in the last 168 hours. ? ?Invalid input(s): CK ?------------------------------------------------------------------------------------------------------------------ ?   ?Component Value Date/Time  ? BNP 143.0 (H) 08/03/2021 1145  ? ? ? ?--------------------------------------------------------------------------------------------------------------- ? ?Urinalysis ?   ?Component Value Date/Time  ? COLORURINE AMBER (A) 10/10/2021 1707  ? APPEARANCEUR CLEAR 10/10/2021 1707  ? LABSPEC 1.030 10/10/2021 1707  ? PHURINE 5.0 10/10/2021 1707  ? GLUCOSEU NEGATIVE 10/10/2021 1707  ? HGBUR MODERATE (A) 10/10/2021 1707  ? [place] NEGATIVE 10/10/2021 1707  ? [place] NEGATIVE 10/10/2021 1707  ? PROTEINUR 30 (A) 10/10/2021 1707  ? NITRITE NEGATIVE 10/10/2021 1707  ? LEUKOCYTESUR NEGATIVE 10/10/2021 1707  ? ? ?---------------------------------------------------------------------------------------------------------------- ? ? Imaging Results:  ? ? No results found. ? ?My personal review of EKG: Rhythm NSR,  ?Vent. rate 66 BPM ?PR interval 146 ms ?QRS duration 132 ms ?QT/QTcB 445/467 ms ?P-R-T  axes 18 -84 70 ?Sinus rhythm ?RBBB and LAFB ?since 08/12/21, has had no changes ? Assessment & Plan:  ? ? Principal Problem: ?  Pneumonia ?Active Problems: ?  HLD (hyperlipidemia) ?  CORONARY ATHEROSCLEROSIS NATI

## 2021-10-10 NOTE — ED Notes (Signed)
Patient transported to CT 

## 2021-10-10 NOTE — ED Notes (Signed)
Attempted second IV access x2. Second nurse attempting at this time.  ?

## 2021-10-10 NOTE — ED Notes (Signed)
Attempted to call report to floor 

## 2021-10-10 NOTE — ED Notes (Signed)
Attempted to call and give report to floor x2 ?

## 2021-10-11 DIAGNOSIS — J189 Pneumonia, unspecified organism: Secondary | ICD-10-CM | POA: Diagnosis not present

## 2021-10-11 LAB — BASIC METABOLIC PANEL
Anion gap: 7 (ref 5–15)
BUN: 26 mg/dL — ABNORMAL HIGH (ref 8–23)
CO2: 26 mmol/L (ref 22–32)
Calcium: 8.5 mg/dL — ABNORMAL LOW (ref 8.9–10.3)
Chloride: 114 mmol/L — ABNORMAL HIGH (ref 98–111)
Creatinine, Ser: 0.91 mg/dL (ref 0.61–1.24)
GFR, Estimated: >60 mL/min (ref >60)
Glucose, Bld: 119 mg/dL — ABNORMAL HIGH (ref 70–99)
Potassium: 3.7 mmol/L (ref 3.5–5.1)
Sodium: 147 mmol/L — ABNORMAL HIGH (ref 135–145)

## 2021-10-11 LAB — CBC
HCT: 31.2 % — ABNORMAL LOW (ref 39.0–52.0)
Hemoglobin: 10.3 g/dL — ABNORMAL LOW (ref 13.0–17.0)
MCH: 31.9 pg (ref 26.0–34.0)
MCHC: 33 g/dL (ref 30.0–36.0)
MCV: 96.6 fL (ref 80.0–100.0)
Platelets: 287 10*3/uL (ref 150–400)
RBC: 3.23 MIL/uL — ABNORMAL LOW (ref 4.22–5.81)
RDW: 13.1 % (ref 11.5–15.5)
WBC: 23.8 10*3/uL — ABNORMAL HIGH (ref 4.0–10.5)
nRBC: 0 % (ref 0.0–0.2)

## 2021-10-11 LAB — GLUCOSE, CAPILLARY
Glucose-Capillary: 127 mg/dL — ABNORMAL HIGH (ref 70–99)
Glucose-Capillary: 122 mg/dL — ABNORMAL HIGH (ref 70–99)
Glucose-Capillary: 120 mg/dL — ABNORMAL HIGH (ref 70–99)
Glucose-Capillary: 114 mg/dL — ABNORMAL HIGH (ref 70–99)
Glucose-Capillary: 107 mg/dL — ABNORMAL HIGH (ref 70–99)
Glucose-Capillary: 123 mg/dL — ABNORMAL HIGH (ref 70–99)

## 2021-10-11 LAB — LACTIC ACID, PLASMA: Lactic Acid, Venous: 1 mmol/L (ref 0.5–1.9)

## 2021-10-11 LAB — STREP PNEUMONIAE URINARY ANTIGEN: Strep Pneumo Urinary Antigen: NEGATIVE

## 2021-10-11 NOTE — Progress Notes (Signed)
? ?                                              Progress note ? Patient Demographics:  ? ? Jacob Little, is a 86 y.o. male  MRN: [id number]   DOB - 11/15/1935 ? ?Admit Date - 10/10/2021 ? ?Outpatient Primary MD for the patient is Jacob Noble, MD ? ?Referring MD/NP/PA: Dr Sabra Heck ? ?Patient coming from: [facility] ? ?Chief Complaint  ?Patient presents with  ? Shortness of Breath  ?  ? ? HPI:  ? ? Jacob Little  is a 86 y.o. male,with medical history significant for history of CVA, dementia, type 2 diabetes mellitus, patient with recent hospitalization in February secondary to sepsis from pneumonia(work-up with low risk of aspiration), and hypoglycemia last March, he was seen today by facility for shortness of breath. ?-Patient is poor historian, with dementia, unable to provide any history, it was obtained from ED staff, patient brought by EMS for shortness of breath, was hypoxic in low 80s on room air, he was placed on nonrebreather, on arrival his oxygen 90% on room air, he was noted by staff at the facility with difficulty breathing, increased congestion in his lungs for last 2 weeks, he had progressive decline since, and he is more agitated and confused compared to his baseline. ?-In ED chest x-ray significant for bilateral lung disease, with elevated lactic acid of 3, leukocytosis of 24.8, patient was started on pneumonia antibiotic coverage and [facility] hospitalist consulted to admit. ? ?Subjective ?Demented and cannot provide history ? ? Physical Exam:  ? ?Vitals ? ?Blood pressure 129/62, pulse 68, temperature 98.8 ?F (37.1 ?C), temperature source Oral, resp. rate 20, weight 66 kg, SpO2 90 %. ? ? ?1. General, thin appearing elderly male, restless, trying to get out of bed ? ?2.  Fair judgment and insight, unable to answer questions or follow commands. ? ?3.  Strength grossly intact, no focal deficits appreciated plantars down going. ? ?4. Ears and Eyes appear Normal, Conjunctivae clear, PERRLA.  Dry oral  Mucosa. ? ?5. Supple Neck, No JVD, No cervical lymphadenopathy appriciated, No Carotid Bruits. ? ?6. Symmetrical Chest wall movement, Good air movement bilaterally, scattered Rales at the bases ? ?7. RRR, No Gallops, Rubs or Murmurs, No Parasternal Heave. ? ?8. Positive Bowel Sounds, Abdomen Soft, No tenderness, No organomegaly appriciated,No rebound -guarding or rigidity. ? ?9.  No Cyanosis, Normal Skin Turgor, No Skin Rash or Bruise. ? ?10. Good muscle tone,  joints appear normal , no effusions, Normal ROM. ? ?11. No Palpable Lymph Nodes in Neck or Axillae ? ? ? Data Review:  ? ? CBC ?Recent Labs  ?Lab 10/10/21 ?1707 10/11/21 ?0602  ?WBC 24.8* 23.8*  ?HGB 12.3* 10.3*  ?HCT 37.9* 31.2*  ?PLT 326 287  ?MCV 96.7 96.6  ?MCH 31.4 31.9  ?MCHC 32.5 33.0  ?RDW 13.2 13.1  ?LYMPHSABS 1.6  --   ?MONOABS 1.3*  --   ?EOSABS 0.0  --   ?BASOSABS 0.1  --   ? ?------------------------------------------------------------------------------------------------------------------ ? ?Chemistries  ?Recent Labs  ?Lab 10/10/21 ?1707 10/11/21 ?0602  ?NA 143 147*  ?K 4.1 3.7  ?CL 110 114*  ?CO2 26 26  ?GLUCOSE 147* 119*  ?BUN 34* 26*  ?CREATININE 1.10 0.91  ?CALCIUM 8.9 8.5*  ?AST 42*  --   ?ALT 54*  --   ?  ALKPHOS 73  --   ?BILITOT 0.8  --   ? ?------------------------------------------------------------------------------------------------------------------ ?estimated creatinine clearance is 57.4 mL/min (by C-G formula based on SCr of 0.91 mg/dL). ?------------------------------------------------------------------------------------------------------------------ ?No results for input(s): TSH, T4TOTAL, T3FREE, THYROIDAB in the last 72 hours. ? ?Invalid input(s): FREET3 ? ?Coagulation profile ?Recent Labs  ?Lab 10/10/21 ?1707  ?INR 1.3*  ? ?------------------------------------------------------------------------------------------------------------------- ?No results for input(s): DDIMER in the last 72  hours. ?------------------------------------------------------------------------------------------------------------------- ? ?Cardiac Enzymes ?No results for input(s): CKMB, TROPONINI, MYOGLOBIN in the last 168 hours. ? ?Invalid input(s): CK ?------------------------------------------------------------------------------------------------------------------ ?   ?Component Value Date/Time  ? BNP 143.0 (H) 08/03/2021 1145  ? ? ? ?--------------------------------------------------------------------------------------------------------------- ? ?Urinalysis ?   ?Component Value Date/Time  ? COLORURINE AMBER (A) 10/10/2021 1707  ? APPEARANCEUR CLEAR 10/10/2021 1707  ? LABSPEC 1.030 10/10/2021 1707  ? PHURINE 5.0 10/10/2021 1707  ? GLUCOSEU NEGATIVE 10/10/2021 1707  ? HGBUR MODERATE (A) 10/10/2021 1707  ? [place] NEGATIVE 10/10/2021 1707  ? [place] NEGATIVE 10/10/2021 1707  ? PROTEINUR 30 (A) 10/10/2021 1707  ? NITRITE NEGATIVE 10/10/2021 1707  ? LEUKOCYTESUR NEGATIVE 10/10/2021 1707  ? ? ?---------------------------------------------------------------------------------------------------------------- ? ? Imaging Results:  ? ? CT Head Wo Contrast ? ?Result Date: 10/10/2021 ?CLINICAL DATA:  Altered mental status, difficulty breathing EXAM: CT HEAD WITHOUT CONTRAST TECHNIQUE: Contiguous axial images were obtained from the base of the skull through the vertex without intravenous contrast. RADIATION DOSE REDUCTION: This exam was performed according to the departmental dose-optimization program which includes automated exposure control, adjustment of the mA and/or kV according to patient size and/or use of iterative reconstruction technique. COMPARISON:  08/12/2021 FINDINGS: Brain: No evidence of acute infarction, hemorrhage, hydrocephalus, extra-axial collection or mass lesion/mass effect. Global cortical and central atrophy. Subcortical white matter and periventricular small vessel ischemic changes. Vascular: Intracranial  atherosclerosis. Skull: Normal. Negative for fracture or focal lesion. Sinuses/Orbits: Mild partial opacification of the ethmoid and sphenoid sinuses. Mastoid air cells are clear. Other: None. IMPRESSION: No evidence of acute intracranial abnormality. Atrophy with small vessel ischemic changes. Electronically Signed   By: Julian Hy M.D.   On: 10/10/2021 20:06  ? ?DG Chest Port 1 View ? ?Result Date: 10/10/2021 ?CLINICAL DATA:  Questionable sepsis - evaluate for abnormality Shortness of breath. EXAM: PORTABLE CHEST 1 VIEW COMPARISON:  08/12/2021, CT 08/03/2021 FINDINGS: Chronic interstitial coarsening. There is acute patchy airspace disease in both lung bases. Stable heart size. Unchanged mediastinal contours. No pleural fluid or pneumothorax. Stable osseous structures. IMPRESSION: 1. Acute patchy airspace disease in both lung bases, suspicious for pneumonia. 2. Chronic interstitial coarsening. Electronically Signed   By: Keith Rake M.D.   On: 10/10/2021 18:03   ? ?My personal review of EKG: Rhythm NSR,  ?Vent. rate 66 BPM ?PR interval 146 ms ?QRS duration 132 ms ?QT/QTcB 445/467 ms ?P-R-T axes 18 -84 70 ?Sinus rhythm ?RBBB and LAFB ?since 08/12/21, has had no changes ? Assessment & Plan:  ? ? Principal Problem: ?  Pneumonia ?Active Problems: ?  HLD (hyperlipidemia) ?  CORONARY ATHEROSCLEROSIS NATIVE CORONARY ARTERY ?  Essential hypertension ?  Diabetes mellitus with hyperglycemia, without long-term current use of insulin ([place]) ?  Vascular dementia with behavior disturbance ([place]) ?  Acute metabolic encephalopathy ? ? ?Sepsis POA due to pneumonia, suspicious for aspiration ?-Sepsis present on admission, significant for worsening encephalopathy, lactic acidosis and leukocytosis ?-Sepsis related to right lower lung pneumonia, possible aspiration, will change antibiotic to IV Unasyn and azithromycin, follow on sputum culture, Legionella antigen and strep pneumonia antigen. ?-We  will keep n.p.o. overnight, on  IV fluids ?-We will consult SLP pending ?-Follow on blood cultures ? ?Acute metabolic encephalopathy/vascular dementia ?-Patient with known underlying vascular dementia, but he is currently more confused and agitated related to his infectious process

## 2021-10-11 NOTE — Progress Notes (Signed)
Patient orally suctioned obtained small amount of thick white/pale yellow secretions obtained from the back of his throat. BBS noted to be diminished with occasional scattered rhonchi. ?

## 2021-10-12 DIAGNOSIS — J189 Pneumonia, unspecified organism: Secondary | ICD-10-CM | POA: Diagnosis not present

## 2021-10-12 LAB — GLUCOSE, CAPILLARY
Glucose-Capillary: 88 mg/dL (ref 70–99)
Glucose-Capillary: 86 mg/dL (ref 70–99)
Glucose-Capillary: 76 mg/dL (ref 70–99)
Glucose-Capillary: 83 mg/dL (ref 70–99)
Glucose-Capillary: 116 mg/dL — ABNORMAL HIGH (ref 70–99)
Glucose-Capillary: 122 mg/dL — ABNORMAL HIGH (ref 70–99)
Glucose-Capillary: 126 mg/dL — ABNORMAL HIGH (ref 70–99)

## 2021-10-12 LAB — BASIC METABOLIC PANEL
Anion gap: 7 (ref 5–15)
BUN: 20 mg/dL (ref 8–23)
CO2: 26 mmol/L (ref 22–32)
Calcium: 8.3 mg/dL — ABNORMAL LOW (ref 8.9–10.3)
Chloride: 111 mmol/L (ref 98–111)
Creatinine, Ser: 0.84 mg/dL (ref 0.61–1.24)
GFR, Estimated: >60 mL/min (ref >60)
Glucose, Bld: 77 mg/dL (ref 70–99)
Potassium: 3.5 mmol/L (ref 3.5–5.1)
Sodium: 144 mmol/L (ref 135–145)

## 2021-10-12 LAB — CBC WITH DIFFERENTIAL/PLATELET
Abs Immature Granulocytes: 0.55 10*3/uL — ABNORMAL HIGH (ref 0.00–0.07)
Basophils Absolute: 0.1 10*3/uL (ref 0.0–0.1)
Basophils Relative: 0 %
Eosinophils Absolute: 0.1 10*3/uL (ref 0.0–0.5)
Eosinophils Relative: 0 %
HCT: 34 % — ABNORMAL LOW (ref 39.0–52.0)
Hemoglobin: 11.1 g/dL — ABNORMAL LOW (ref 13.0–17.0)
Immature Granulocytes: 4 %
Lymphocytes Relative: 9 %
Lymphs Abs: 1.2 10*3/uL (ref 0.7–4.0)
MCH: 31.4 pg (ref 26.0–34.0)
MCHC: 32.6 g/dL (ref 30.0–36.0)
MCV: 96 fL (ref 80.0–100.0)
Monocytes Absolute: 0.9 10*3/uL (ref 0.1–1.0)
Monocytes Relative: 7 %
Neutro Abs: 11.5 10*3/uL — ABNORMAL HIGH (ref 1.7–7.7)
Neutrophils Relative %: 80 %
Platelets: 278 10*3/uL (ref 150–400)
RBC: 3.54 MIL/uL — ABNORMAL LOW (ref 4.22–5.81)
RDW: 12.9 % (ref 11.5–15.5)
WBC: 14.3 10*3/uL — ABNORMAL HIGH (ref 4.0–10.5)
nRBC: 0 % (ref 0.0–0.2)

## 2021-10-12 LAB — URINE CULTURE: Culture: NO GROWTH

## 2021-10-12 MED ORDER — DEXTROSE 5 % IV SOLN: INTRAVENOUS | Status: DC

## 2021-10-12 NOTE — Progress Notes (Signed)
? ?                                              Progress note ? Patient Demographics:  ? ? Jacob Little, is a 86 y.o. male  MRN: [id number]   DOB - 05-20-1936 ? ?Admit Date - 10/10/2021 ? ?Outpatient Primary MD for the patient is [name], MD ? ?Referring MD/NP/PA: Dr Sabra Heck ? ?Patient coming from: [facility] ? ?Chief Complaint  ?Patient presents with  ? Shortness of Breath  ?  ? ? HPI:  ? ? Jacob Little  is a 86 y.o. male,with medical history significant for history of CVA, dementia, type 2 diabetes mellitus, patient with recent hospitalization in February secondary to sepsis from pneumonia(work-up with low risk of aspiration), and hypoglycemia last March, he was seen today by facility for shortness of breath. ?-Patient is poor historian, with dementia, unable to provide any history, it was obtained from ED staff, patient brought by EMS for shortness of breath, was hypoxic in low 80s on room air, he was placed on nonrebreather, on arrival his oxygen 90% on room air, he was noted by staff at the facility with difficulty breathing, increased congestion in his lungs for last 2 weeks, he had progressive decline since, and he is more agitated and confused compared to his baseline. ?-In ED chest x-ray significant for bilateral lung disease, with elevated lactic acid of 3, leukocytosis of 24.8, patient was started on pneumonia antibiotic coverage and [facility] hospitalist consulted to admit. ? ?Subjective ?Demented and cannot provide history ? ? Physical Exam:  ? ?Vitals ? ?Blood pressure (!) 148/69, pulse (!) 54, temperature 98.6 ?F (37 ?C), resp. rate 18, weight 66 kg, SpO2 96 %. ? ? ?1. General, thin appearing elderly male, resting, demented ? ?2.  Fair judgment and insight, unable to answer questions or follow commands. ? ?3.  Strength grossly intact, no focal deficits appreciated plantars down going. ? ?4. Ears and Eyes appear Normal, Conjunctivae clear, PERRLA.  Dry oral Mucosa. ? ?5. Supple Neck, No JVD, No  cervical lymphadenopathy appriciated, No Carotid Bruits. ? ?6. Symmetrical Chest wall movement, Good air movement bilaterally, scattered Rales at the bases ? ?7. RRR, No Gallops, Rubs or Murmurs, No Parasternal Heave. ? ?8. Positive Bowel Sounds, Abdomen Soft, No tenderness, No organomegaly appriciated,No rebound -guarding or rigidity. ? ?9.  No Cyanosis, Normal Skin Turgor, No Skin Rash or Bruise. ? ?10. Good muscle tone,  joints appear normal , no effusions, Normal ROM. ? ?11. No Palpable Lymph Nodes in Neck or Axillae ? ? ? Data Review:  ? ? CBC ?Recent Labs  ?Lab 10/10/21 ?1707 10/11/21 ?0602 10/12/21 ?0745  ?WBC 24.8* 23.8* 14.3*  ?HGB 12.3* 10.3* 11.1*  ?HCT 37.9* 31.2* 34.0*  ?PLT 326 287 278  ?MCV 96.7 96.6 96.0  ?MCH 31.4 31.9 31.4  ?MCHC 32.5 33.0 32.6  ?RDW 13.2 13.1 12.9  ?LYMPHSABS 1.6  --  1.2  ?MONOABS 1.3*  --  0.9  ?EOSABS 0.0  --  0.1  ?BASOSABS 0.1  --  0.1  ? ?------------------------------------------------------------------------------------------------------------------ ? ?Chemistries  ?Recent Labs  ?Lab 10/10/21 ?1707 10/11/21 ?0602 10/12/21 ?0745  ?NA 143 147* 144  ?K 4.1 3.7 3.5  ?CL 110 114* 111  ?CO2 '26 26 26  '$ ?GLUCOSE 147* 119* 77  ?BUN 34* 26* 20  ?CREATININE 1.10 0.91 0.84  ?  CALCIUM 8.9 8.5* 8.3*  ?AST 42*  --   --   ?ALT 54*  --   --   ?ALKPHOS 73  --   --   ?BILITOT 0.8  --   --   ? ?------------------------------------------------------------------------------------------------------------------ ?estimated creatinine clearance is 62.2 mL/min (by C-G formula based on SCr of 0.84 mg/dL). ?------------------------------------------------------------------------------------------------------------------ ?No results for input(s): TSH, T4TOTAL, T3FREE, THYROIDAB in the last 72 hours. ? ?Invalid input(s): FREET3 ? ?Coagulation profile ?Recent Labs  ?Lab 10/10/21 ?1707  ?INR 1.3*   ? ?------------------------------------------------------------------------------------------------------------------- ?No results for input(s): DDIMER in the last 72 hours. ?------------------------------------------------------------------------------------------------------------------- ? ?Cardiac Enzymes ?No results for input(s): CKMB, TROPONINI, MYOGLOBIN in the last 168 hours. ? ?Invalid input(s): CK ?------------------------------------------------------------------------------------------------------------------ ?   ?Component Value Date/Time  ? BNP 143.0 (H) 08/03/2021 1145  ? ? ? ?--------------------------------------------------------------------------------------------------------------- ? ?Urinalysis ?   ?Component Value Date/Time  ? COLORURINE AMBER (A) 10/10/2021 1707  ? APPEARANCEUR CLEAR 10/10/2021 1707  ? LABSPEC 1.030 10/10/2021 1707  ? PHURINE 5.0 10/10/2021 1707  ? GLUCOSEU NEGATIVE 10/10/2021 1707  ? HGBUR MODERATE (A) 10/10/2021 1707  ? [place] NEGATIVE 10/10/2021 1707  ? [place] NEGATIVE 10/10/2021 1707  ? PROTEINUR 30 (A) 10/10/2021 1707  ? NITRITE NEGATIVE 10/10/2021 1707  ? LEUKOCYTESUR NEGATIVE 10/10/2021 1707  ? ? ?---------------------------------------------------------------------------------------------------------------- ? ? Imaging Results:  ? ? CT Head Wo Contrast ? ?Result Date: 10/10/2021 ?CLINICAL DATA:  Altered mental status, difficulty breathing EXAM: CT HEAD WITHOUT CONTRAST TECHNIQUE: Contiguous axial images were obtained from the base of the skull through the vertex without intravenous contrast. RADIATION DOSE REDUCTION: This exam was performed according to the departmental dose-optimization program which includes automated exposure control, adjustment of the mA and/or kV according to patient size and/or use of iterative reconstruction technique. COMPARISON:  08/12/2021 FINDINGS: Brain: No evidence of acute infarction, hemorrhage, hydrocephalus, extra-axial  collection or mass lesion/mass effect. Global cortical and central atrophy. Subcortical white matter and periventricular small vessel ischemic changes. Vascular: Intracranial atherosclerosis. Skull: Normal. Negative for fracture or focal lesion. Sinuses/Orbits: Mild partial opacification of the ethmoid and sphenoid sinuses. Mastoid air cells are clear. Other: None. IMPRESSION: No evidence of acute intracranial abnormality. Atrophy with small vessel ischemic changes. Electronically Signed   By: Julian Hy M.D.   On: 10/10/2021 20:06  ? ?DG Chest Port 1 View ? ?Result Date: 10/10/2021 ?CLINICAL DATA:  Questionable sepsis - evaluate for abnormality Shortness of breath. EXAM: PORTABLE CHEST 1 VIEW COMPARISON:  08/12/2021, CT 08/03/2021 FINDINGS: Chronic interstitial coarsening. There is acute patchy airspace disease in both lung bases. Stable heart size. Unchanged mediastinal contours. No pleural fluid or pneumothorax. Stable osseous structures. IMPRESSION: 1. Acute patchy airspace disease in both lung bases, suspicious for pneumonia. 2. Chronic interstitial coarsening. Electronically Signed   By: Keith Rake M.D.   On: 10/10/2021 18:03   ? ?My personal review of EKG: Rhythm NSR,  ?Vent. rate 66 BPM ?PR interval 146 ms ?QRS duration 132 ms ?QT/QTcB 445/467 ms ?P-R-T axes 18 -84 70 ?Sinus rhythm ?RBBB and LAFB ?since 08/12/21, has had no changes ? Assessment & Plan:  ? ? Principal Problem: ?  Pneumonia ?Active Problems: ?  HLD (hyperlipidemia) ?  CORONARY ATHEROSCLEROSIS NATIVE CORONARY ARTERY ?  Essential hypertension ?  Diabetes mellitus with hyperglycemia, without long-term current use of insulin ([place]) ?  Vascular dementia with behavior disturbance ([place]) ?  Acute metabolic encephalopathy ? ? ?Sepsis POA due to pneumonia, suspicious for aspiration ?-Sepsis present on admission, significant for worsening encephalopathy, lactic acidosis and  leukocytosis ?-Sepsis related to right lower lung pneumonia, possible  aspiration, will change antibiotic to IV Unasyn and azithromycin, follow on sputum culture, Legionella antigen and strep pneumonia antigen. ?-We will keep n.p.o. overnight, on IV fluids ?-Speech therapy consult still pending ?-Follow on blood cultures ? ?Acute metabolic encephalopathy/vascular dementia ?-Patient with known

## 2021-10-12 NOTE — Therapy (Signed)
Therapist attempted to evaluate patient today; he was asleep and I was unable to rouse him for therapy evaluation.  He is on 3.5 L of O2. Opened his eyes briefly once. Will attempt again in the next day or two.  ? ?10:50 AM, 10/12/21 ?Dejha King Small Sunjai Levandoski MPT ?[facility] ?[place] [phone] ?Ph:[phone] ? ?

## 2021-10-13 ENCOUNTER — Encounter (HOSPITAL_COMMUNITY): Payer: Self-pay | Admitting: Internal Medicine

## 2021-10-13 DIAGNOSIS — J189 Pneumonia, unspecified organism: Secondary | ICD-10-CM | POA: Diagnosis not present

## 2021-10-13 DIAGNOSIS — E0865 Diabetes mellitus due to underlying condition with hyperglycemia: Secondary | ICD-10-CM | POA: Diagnosis not present

## 2021-10-13 DIAGNOSIS — I2510 Atherosclerotic heart disease of native coronary artery without angina pectoris: Secondary | ICD-10-CM | POA: Diagnosis not present

## 2021-10-13 DIAGNOSIS — A419 Sepsis, unspecified organism: Secondary | ICD-10-CM

## 2021-10-13 DIAGNOSIS — Z515 Encounter for palliative care: Secondary | ICD-10-CM

## 2021-10-13 DIAGNOSIS — F01518 Vascular dementia, unspecified severity, with other behavioral disturbance: Secondary | ICD-10-CM

## 2021-10-13 DIAGNOSIS — I10 Essential (primary) hypertension: Secondary | ICD-10-CM

## 2021-10-13 DIAGNOSIS — Z7189 Other specified counseling: Secondary | ICD-10-CM

## 2021-10-13 DIAGNOSIS — G9341 Metabolic encephalopathy: Secondary | ICD-10-CM | POA: Diagnosis not present

## 2021-10-13 DIAGNOSIS — E785 Hyperlipidemia, unspecified: Secondary | ICD-10-CM

## 2021-10-13 LAB — BASIC METABOLIC PANEL
Anion gap: 4 — ABNORMAL LOW (ref 5–15)
BUN: 17 mg/dL (ref 8–23)
CO2: 27 mmol/L (ref 22–32)
Calcium: 8 mg/dL — ABNORMAL LOW (ref 8.9–10.3)
Chloride: 107 mmol/L (ref 98–111)
Creatinine, Ser: 0.77 mg/dL (ref 0.61–1.24)
GFR, Estimated: >60 mL/min (ref >60)
Glucose, Bld: 144 mg/dL — ABNORMAL HIGH (ref 70–99)
Potassium: 3.3 mmol/L — ABNORMAL LOW (ref 3.5–5.1)
Sodium: 138 mmol/L (ref 135–145)

## 2021-10-13 LAB — LEGIONELLA PNEUMOPHILA SEROGP 1 UR AG: L. pneumophila Serogp 1 Ur Ag: NEGATIVE

## 2021-10-13 LAB — GLUCOSE, CAPILLARY
Glucose-Capillary: 126 mg/dL — ABNORMAL HIGH (ref 70–99)
Glucose-Capillary: 134 mg/dL — ABNORMAL HIGH (ref 70–99)
Glucose-Capillary: 145 mg/dL — ABNORMAL HIGH (ref 70–99)
Glucose-Capillary: 146 mg/dL — ABNORMAL HIGH (ref 70–99)
Glucose-Capillary: 157 mg/dL — ABNORMAL HIGH (ref 70–99)
Glucose-Capillary: 226 mg/dL — ABNORMAL HIGH (ref 70–99)

## 2021-10-13 MED ORDER — AZITHROMYCIN 250 MG PO TABS: 500.0000 mg | ORAL_TABLET | Freq: Every day | ORAL | Status: DC

## 2021-10-13 MED ORDER — AZITHROMYCIN 250 MG PO TABS
500.0000 mg | ORAL_TABLET | Freq: Every day | ORAL | Status: DC
  Administered 2021-10-14: 500 mg via ORAL
  Filled 2021-10-13: qty 2

## 2021-10-13 NOTE — Plan of Care (Signed)
?  Problem: Acute Rehab OT Goals (only OT should resolve) ?Goal: Pt. Will Perform Eating ?Flowsheets (Taken 10/13/2021 1007) ?Pt Will Perform Eating: ? with min assist ? sitting ?Goal: Pt. Will Perform Grooming ?Flowsheets (Taken 10/13/2021 1007) ?Pt Will Perform Grooming: ? with min assist ? sitting ?Goal: Pt. Will Perform Upper Body Bathing ?Flowsheets (Taken 10/13/2021 1007) ?Pt Will Perform Upper Body Bathing: ? with min assist ? sitting ?Goal: Pt. Will Perform Lower Body Bathing ?Flowsheets (Taken 10/13/2021 1007) ?Pt Will Perform Lower Body Bathing: ? with mod assist ? bed level ?Goal: Pt. Will Perform Upper Body Dressing ?Flowsheets (Taken 10/13/2021 1007) ?Pt Will Perform Upper Body Dressing: ? with min assist ? sitting ?Goal: Pt. Will Perform Lower Body Dressing ?Flowsheets (Taken 10/13/2021 1007) ?Pt Will Perform Lower Body Dressing: ? with mod assist ? bed level ?Goal: Pt. Will Transfer To Toilet ?Flowsheets (Taken 10/13/2021 1007) ?Pt Will Transfer to Toilet: ? with min assist ? ambulating ? bedside commode ?Goal: Pt. Will Perform Toileting-Clothing Manipulation ?Flowsheets (Taken 10/13/2021 1007) ?Pt Will Perform Toileting - Clothing Manipulation and hygiene: ? with min assist ? sit to/from stand ? sitting/lateral leans ?Goal: OT Additional ADL Goal #1 ?Flowsheets (Taken 10/13/2021 1007) ?Additional ADL Goal #1: Patient will increase his standing and sitting balance while completing his morning ADL requiring only Min assist to maintain balance. ?  ?

## 2021-10-13 NOTE — Plan of Care (Signed)
?  Problem: Acute Rehab PT Goals(only PT should resolve) ?Goal: Pt Will Go Supine/Side To Sit ?Outcome: Progressing ?Flowsheets (Taken 10/13/2021 1358) ?Pt will go Supine/Side to Sit: ? with minimal assist ? with moderate assist ?Goal: Patient Will Transfer Sit To/From Stand ?Outcome: Progressing ?Flowsheets (Taken 10/13/2021 1358) ?Patient will transfer sit to/from stand: ? with minimal assist ? with moderate assist ?Goal: Pt Will Transfer Bed To Chair/Chair To Bed ?Outcome: Progressing ?Flowsheets (Taken 10/13/2021 1358) ?Pt will Transfer Bed to Chair/Chair to Bed: ? with min assist ? with mod assist ?Goal: Pt Will Ambulate ?Outcome: Progressing ?Flowsheets (Taken 10/13/2021 1358) ?Pt will Ambulate: ? 15 feet ? with moderate assist ? with rolling walker ?  ?1:58 PM, 10/13/21 ?Lonell Grandchild, MPT ?Physical Therapist with [place] ?[facility] ?[phone] office ?[phone] mobile phone ? ?

## 2021-10-13 NOTE — NC FL2 (Signed)
?[place] MEDICAID FL2 LEVEL OF CARE SCREENING TOOL  ?  ? ?IDENTIFICATION  ?Patient Name: ?Jacob Little Birthdate: Dec 25, 1935 Sex: male Admission Date (Current Location): ?10/10/2021  ?[place] and [place] Number: ? [place] and Address:  ?[facility] [address], [place] ?     Provider Number: ?[id number]  ?Attending Physician Name and Address:  ?Deatra James, MD ? Relative Name and Phone Number:  ?  ?   ?Current Level of Care: ?Hospital Recommended Level of Care: ?[place] Prior Approval Number: ?  ? ?Date Approved/Denied: ?  PASRR Number: ?[id number] ? ?Discharge Plan: ?SNF ?  ? ?Current Diagnoses: ?Patient Active Problem List  ? Diagnosis Date Noted  ? Pneumonia 10/10/2021  ? History of stroke 08/12/2021  ? Hypothermia 08/12/2021  ? Acute encephalopathy 08/03/2021  ? Insulin dependent type 2 diabetes mellitus ([place]) 08/03/2021  ? Viral bronchitis 03/16/2018  ? Tremor of left hand 03/16/2018  ? 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 Acute metabolic encephalopathy 03/50/0938  ? Dehydration   ? Orthostatic hypotension   ? Generalized weakness 01/05/2018  ? Acute renal failure superimposed on stage 3 chronic kidney disease ([place]) 01/05/2018  ? Lactic acidosis 01/05/2018  ? Diarrhea 01/05/2018  ? Vascular dementia with behavior disturbance ([place])   ? Acute embolic stroke ([place]) 18/29/9371  ? Encephalopathy 11/08/2017  ? Acute ischemic stroke ([place]) 11/08/2017  ? Essential hypertension 11/08/2017  ? Diabetes mellitus with hyperglycemia, without long-term current use of insulin ([place]) 11/08/2017  ? Acute ischemic left MCA stroke ([place]) 11/08/2017  ? DENTAL PAIN 05/12/2010  ? CORONARY ATHEROSCLEROSIS NATIVE CORONARY ARTERY 07/22/2009  ? DM 07/12/2009  ? HLD (hyperlipidemia) 07/12/2009  ? HYPERTENSION 07/12/2009  ? ANGINA, ATYPICAL 07/12/2009  ? ? ?Orientation RESPIRATION BLADDER Height & Weight   ?  ?  ? O2 (3L) Continent, External catheter Weight:  145 lb 8.1 oz (66 kg) ?Height:     ?BEHAVIORAL SYMPTOMS/MOOD NEUROLOGICAL BOWEL NUTRITION STATUS  ?    Incontinent Diet (See D/C summary)  ?AMBULATORY STATUS COMMUNICATION OF NEEDS Skin   ?Extensive Assist Verbally Normal ?  ?  ?  ?    ?     ?     ? ? ?Personal Care Assistance Level of Assistance  ?Bathing, Dressing, Feeding Bathing Assistance: Limited assistance ?Feeding assistance: Independent ?Dressing Assistance: Limited assistance ?   ? ?Functional Limitations Info  ?Sight, Speech, Hearing Sight Info: Adequate ?Hearing Info: Adequate ?Speech Info: Adequate  ? ? ?SPECIAL CARE FACTORS FREQUENCY  ?PT (By licensed PT), OT (By licensed OT)   ?  ?PT Frequency: 5 times weekly ?OT Frequency: 5 times weekly ?  ?  ?  ?   ? ? ?Contractures Contractures Info: Not present  ? ? ?Additional Factors Info  ?Code Status, Allergies Code Status Info: DNR ?Allergies Info: NKA ?  ?  ?  ?   ? ?Current Medications (10/13/2021):  This is the current hospital active medication list ?Current Facility-Administered Medications  ?Medication Dose Route Frequency Provider Last Rate Last Admin  ? albuterol (PROVENTIL) (2.5 MG/3ML) 0.083% nebulizer solution 2.5 mg  2.5 mg Nebulization Q2H PRN Elgergawy, Silver Huguenin, MD   2.5 mg at 10/11/21 6967  ? Ampicillin-Sulbactam (UNASYN) 3 g in sodium chloride 0.9 % 100 mL IVPB  3 g Intravenous Q8H Pham, Minh Q, RPH-CPP 200 mL/hr at 10/13/21 1048 3 g at 10/13/21 1048  ? azithromycin (ZITHROMAX) tablet 500 mg  500  mg Oral Daily Shahmehdi, Seyed A, MD      ? dextrose 5 % solution   Intravenous Continuous Phillips Grout, MD 50 mL/hr at 10/13/21 0040 New Bag at 10/13/21 0040  ? enoxaparin (LOVENOX) injection 40 mg  40 mg Subcutaneous Q24H Elgergawy, Silver Huguenin, MD   40 mg at 10/12/21 2159  ? haloperidol lactate (HALDOL) injection 2 mg  2 mg Intravenous Q6H PRN Elgergawy, Silver Huguenin, MD   2 mg at 10/11/21 1017  ? ? ? ?Discharge Medications: ?Please see discharge summary for a list of discharge medications. ? ?Relevant  Imaging Results: ? ?Relevant Lab Results: ? ? ?Additional Information ?SSN: [id number] ? ?Iona Beard, LCSWA ? ? ? ? ?

## 2021-10-13 NOTE — TOC Initial Note (Addendum)
Transition of Care (TOC) - Initial/Assessment Note  ? ? ?Patient Details  ?Name: Jacob Little ?MRN: [id number] ?Date of Birth: 1936-04-27 ? ?Transition of Care (TOC) CM/SW Contact:    ?Iona Beard, LCSWA ?Phone Number: ?10/13/2021, 10:02 AM ? ?Clinical Narrative:                 ?Pt is high risk for readmission. CSW spoke with adminstrator Tammy with [facility]. Tammy states that pt is normally ambulatory with a walker. Tammy states pt does not normally wear O2 at their facility. Tammy feels that pt would likely benefit from [place] before returning to their facility. CSW explained that PT will need to evaluate pt and we can update her with their recommendation.  ? ?CSW reached out to pts daughter who is also POA for update. CSW left VM requesting return call. TOC to follow.  ? ?Addendum: CSW spoke with pts daughter who states that she is agreeable to SNF if it is recommended by PT. Family would like for pt to be in [place] if possible. Pts daughter states she feels [facility] would be the best option. TOC to follow for PT recommendation.  ? ?Expected Discharge Plan: [place] ?Barriers to Discharge: Continued Medical Work up ? ? ?Patient Goals and CMS Choice ?  ?CMS Medicare.gov Compare Post Acute Care list provided to:: Patient Represenative (must comment) ?Choice offered to / list presented to : Adult Children, HC POA / Guardian ? ?Expected Discharge Plan and Services ?Expected Discharge Plan: [place] ?In-house Referral: Clinical Social Work ?  ?Post Acute Care Choice: [place] ?Living arrangements for the past 2 months: [place] ?                ?  ?  ?  ?  ?  ?  ?  ?  ?  ?  ? ?Prior Living Arrangements/Services ?Living arrangements for the past 2 months: [place] ?Lives with:: Facility Resident ?Patient language and need for interpreter reviewed:: Yes ?Do you feel safe going back to the place where you live?:  Yes      ?Need for Family Participation in Patient Care: Yes (Comment) ?Care giver support system in place?: Yes (comment) ?Current home services: DME (walker) ?Criminal Activity/Legal Involvement Pertinent to Current Situation/Hospitalization: No - Comment as needed ? ?Activities of Daily Living ?  ?  ? ?Permission Sought/Granted ?  ?  ?   ?   ?   ?   ? ?Emotional Assessment ?  ?  ?  ?  ?Alcohol / Substance Use: Not Applicable ?Psych Involvement: No (comment) ? ?Admission diagnosis:  Pneumonia [J18.9] ?Patient Active Problem List  ? Diagnosis Date Noted  ? Pneumonia 10/10/2021  ? History of stroke 08/12/2021  ? Hypothermia 08/12/2021  ? Acute encephalopathy 08/03/2021  ? Insulin dependent type 2 diabetes mellitus ([place]) 08/03/2021  ? Viral bronchitis 03/16/2018  ? Tremor of left hand 03/16/2018  ? 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 Acute metabolic encephalopathy 62/83/1517  ? Dehydration   ? Orthostatic hypotension   ? Generalized weakness 01/05/2018  ? Acute renal failure superimposed on stage 3 chronic kidney disease ([place]) 01/05/2018  ? Lactic acidosis 01/05/2018  ? Diarrhea 01/05/2018  ? Vascular dementia with behavior disturbance ([place])   ? Acute embolic stroke ([place]) 61/60/7371  ? Encephalopathy 11/08/2017  ? Acute ischemic stroke ([place]) 11/08/2017  ? Essential hypertension 11/08/2017  ? Diabetes mellitus with  hyperglycemia, without long-term current use of insulin ([place]) 11/08/2017  ? Acute ischemic left MCA stroke ([place]) 11/08/2017  ? DENTAL PAIN 05/12/2010  ? CORONARY ATHEROSCLEROSIS NATIVE CORONARY ARTERY 07/22/2009  ? DM 07/12/2009  ? HLD (hyperlipidemia) 07/12/2009  ? HYPERTENSION 07/12/2009  ? ANGINA, ATYPICAL 07/12/2009  ? ?PCP:  [name], MD ?Pharmacy:   ?[facility] ?[address] ?[place] [address] ?Phone: [phone] Fax: [phone] ? ?[place] [place] ?[place] ?[place] [address] ?Phone: [phone] Fax:  [phone] ? ? ? ? ?Social Determinants of Health (SDOH) Interventions ?  ? ?Readmission Risk Interventions ? ?  10/13/2021  ? 10:01 AM 08/05/2021  ?  1:18 PM  ?Readmission Risk Prevention Plan  ?Transportation Screening Complete Complete  ?PCP or Specialist Appt within 5-7 Days  Complete  ?Home Care Screening  Complete  ?Medication Review (RN CM)  Complete  ?[place] or Home Care Consult Complete   ?Social Work Consult for [place] Planning/Counseling Complete   ?Palliative Care Screening Not Applicable   ?Medication Review Press photographer) Complete   ? ? ? ?

## 2021-10-13 NOTE — Progress Notes (Signed)
? ?                                              Progress note ? Patient Demographics:  ? ? Jacob Little, is a 86 y.o. male  MRN: [id number]   DOB - June 18, 1935 ? ?Admit Date - 10/10/2021 ? ?Outpatient Primary MD for the patient is [name], MD ? ?Referring MD/NP/PA: Dr Sabra Heck ? ?Patient coming from: [facility] ?CC: SOB  ? ? ?Subjective:  ? ?The patient was seen and examined this morning, sitting up in chair, confused but awake alert following some commands ?Hemodynamically stable ?No issues overnight ? ?Hospital course - Jacob Little:  ? ? Jacob Little  is a 86 y.o. male,with medical history significant for history of CVA, dementia, type 2 diabetes mellitus, patient with recent hospitalization in February secondary to sepsis from pneumonia(work-up with low risk of aspiration), and hypoglycemia last March, he was seen today by facility for shortness of breath. ?-Patient is poor historian, with dementia, unable to provide any history, it was obtained from ED staff, patient brought by EMS for shortness of breath, was hypoxic in low 80s on room air, he was placed on nonrebreather, on arrival his oxygen 90% on room air, he was noted by staff at the facility with difficulty breathing, increased congestion in his lungs for last 2 weeks, he had progressive decline since, and he is more agitated and confused compared to his baseline. ?-In ED chest x-ray significant for bilateral lung disease, with elevated lactic acid of 3, leukocytosis of 24.8, patient was started on pneumonia antibiotic coverage and [facility] hospitalist consulted to admit. ? ? ? ? Assessment & Plan:  ? ? Principal Problem: ?  Pneumonia ?Active Problems: ?  HLD (hyperlipidemia) ?  CORONARY ATHEROSCLEROSIS NATIVE CORONARY ARTERY ?  Essential hypertension ?  Diabetes mellitus with hyperglycemia, without long-term current use of insulin ([place]) ?  Vascular dementia with behavior disturbance ([place]) ?  Acute metabolic encephalopathy ? ? ?Sepsis POA due to  pneumonia, suspicious for aspiration ?-Resolved sepsis physiology ?-Still confused likely baseline due to advanced dementia ?-Sepsis present on admission, significant for worsening encephalopathy, lactic acidosis and leukocytosis -l ?-Sepsis-likely source of infection - right lower lung pneumonia, possible aspiration,  ?-Currently on IV antibiotics of Unasyn/azithromycin ?-Sputum culture -no results ?-Blood cultures no growth to date ?-We will keep n.p.o. overnight, on IV fluids ?-We will consult SLP pending ? ? ?Acute metabolic encephalopathy/vascular dementia ?-Agitated and confused ?-Mentation - likely close to baseline ?-Patient with known underlying vascular dementia, but he is currently more confused and agitated related to his infectious process ?-Continue home dose Risperdal, will add as needed haloperidol as well ? ?History of CVA ?-With underlying vascular dementia, continue with supportive care ?-Continue with aspirin and statin ? ?Hypertension ?- Continue with Imdur and metoprolol ?-Stable ? ?Type 2 diabetes mellitus ?-We will hold his Januvia,  ?- NPO  ?-Pending speech evaluation ?-Checking CBG q. 4-6 hours with SSI coverage ? ?History of CAD ?-Continue with aspirin, statin, Imdur and metoprolol ? ? ?Ethics:  patient is elderly male with advanced dementia and multiple comorbidities ?Prognosis remain poor, high risk for demise within the hospitalization ?CODE STATUS-  DNR ?Consulting palliative care for determining goals of care ? ? ? ?DVT Prophylaxis  Lovenox ?Further recommendations pending on overall hospital course ?Code Status DNR discussed with the daughter. ? ?  Likely DC to  back to SNF ? ?Condition GUARDED  ? ?Consults called: Palliative care team ?Admission status: inpatient   ? ? ? Physical Exam:  ?Blood pressure (!) 146/69, pulse 77, temperature 98.6 ?F (37 ?C), resp. rate 20, weight 66 kg, SpO2 93 %. ? ? ? ?Physical Exam: ?  ?General:  Confused, elderly male chronically ill  ?HEENT:   Normocephalic, PERRL, otherwise with in Normal limits   ?Neuro:  CNII-XII intact. , normal motor and sensation, reflexes intact   ?Lungs:   Clear to auscultation BL, Respirations unlabored,  ?No wheezes / crackles  ?Cardio:    S1/S2, RRR, No murmure, No Rubs or Gallops   ?Abdomen:  Soft, non-tender, bowel sounds active all four quadrants, ?no guarding or peritoneal signs.  ?Muscular  ?skeletal:  Limited exam -global generalized weaknesses ?- in bed, able to move all 4 extremities,   ?2+ pulses,  symmetric, No pitting edema  ?Skin:  Dry, warm to touch, negative for any Rashes,  ?Wounds: Please see nursing documentation ?   ? ? ?  ? ? ? Data Review:  ? ? CBC ?Recent Labs  ?Lab 10/10/21 ?1707 10/11/21 ?0602 10/12/21 ?0745  ?WBC 24.8* 23.8* 14.3*  ?HGB 12.3* 10.3* 11.1*  ?HCT 37.9* 31.2* 34.0*  ?PLT 326 287 278  ?MCV 96.7 96.6 96.0  ?MCH 31.4 31.9 31.4  ?MCHC 32.5 33.0 32.6  ?RDW 13.2 13.1 12.9  ?LYMPHSABS 1.6  --  1.2  ?MONOABS 1.3*  --  0.9  ?EOSABS 0.0  --  0.1  ?BASOSABS 0.1  --  0.1  ? ?------------------------------------------------------------------------------------------------------------------ ? ?Chemistries  ?Recent Labs  ?Lab 10/10/21 ?1707 10/11/21 ?0602 10/12/21 ?0745 10/13/21 ?0838  ?NA 143 147* 144 138  ?K 4.1 3.7 3.5 3.3*  ?CL 110 114* 111 107  ?CO2 '26 26 26 27  '$ ?GLUCOSE 147* 119* 77 144*  ?BUN 34* 26* 20 17  ?CREATININE 1.10 0.91 0.84 0.77  ?CALCIUM 8.9 8.5* 8.3* 8.0*  ?AST 42*  --   --   --   ?ALT 54*  --   --   --   ?ALKPHOS 73  --   --   --   ?BILITOT 0.8  --   --   --   ? ?------------------------------------------------------------------------------------------------------------------ ?estimated creatinine clearance is 65.3 mL/min (by C-G formula based on SCr of 0.77 mg/dL). ?------------------------------------------------------------------------------------------------------------------ ?No results for input(s): TSH, T4TOTAL, T3FREE, THYROIDAB in the last 72 hours. ? ?Invalid input(s):  FREET3 ? ?Coagulation profile ?Recent Labs  ?Lab 10/10/21 ?1707  ?INR 1.3*  ? ?------------------------------------------------------------------------------------------------------------------- ?No results for input(s): DDIMER in the last 72 hours. ?------------------------------------------------------------------------------------------------------------------- ? ?Cardiac Enzymes ?No results for input(s): CKMB, TROPONINI, MYOGLOBIN in the last 168 hours. ? ?Invalid input(s): CK ?------------------------------------------------------------------------------------------------------------------ ?   ?Component Value Date/Time  ? BNP 143.0 (H) 08/03/2021 1145  ? ? ? ?--------------------------------------------------------------------------------------------------------------- ? ?Urinalysis ?   ?Component Value Date/Time  ? COLORURINE AMBER (A) 10/10/2021 1707  ? APPEARANCEUR CLEAR 10/10/2021 1707  ? LABSPEC 1.030 10/10/2021 1707  ? PHURINE 5.0 10/10/2021 1707  ? GLUCOSEU NEGATIVE 10/10/2021 1707  ? HGBUR MODERATE (A) 10/10/2021 1707  ? [place] NEGATIVE 10/10/2021 1707  ? [place] NEGATIVE 10/10/2021 1707  ? PROTEINUR 30 (A) 10/10/2021 1707  ? NITRITE NEGATIVE 10/10/2021 1707  ? LEUKOCYTESUR NEGATIVE 10/10/2021 1707  ? ? ?---------------------------------------------------------------------------------------------------------------- ? ? Imaging Results:  ? ? No results found. ? ?My personal review of EKG: Rhythm NSR,  ?Vent. rate 66 BPM ?PR interval 146 ms ?QRS duration 132 ms ?QT/QTcB 445/467  ms ?P-R-T axes 18 -84 70 ?Sinus rhythm ?RBBB and LAFB ? ? ?SIGNED: Deatra James, MD, FHM. ?[facility],  Pager (please use [email or web address] to page/text)  ?Please use Epic Secure Chat for non-urgent communication (7AM-7PM) ?If 7PM-7AM, please contact night-coverage ?[email or web address],  ?10/13/2021, 12:48 PM ? ? ? ?

## 2021-10-13 NOTE — Progress Notes (Signed)
Pharmacy Antibiotic Note ? ?Jacob Little is a 86 y.o. male admitted on 10/10/2021 with pneumonia.  Pharmacy has been consulted for Unasyn dosing. ? ?Pt admitted from SNF due to increase difficulty breathing. CXR suspicious for PNA. Concern for aspiration. WBC improved ?For swallowing evaluation. Patient improving ? ?Plan: ?Continue Unasyn 3g IV q8 ?F/U transition to po  ?F/u with LOT and cultures ? ?Weight: 66 kg (145 lb 8.1 oz) ? ?Temp (24hrs), Avg:98.2 ?F (36.8 ?C), Min:97.9 ?F (36.6 ?C), Max:98.6 ?F (37 ?C) ? ?Recent Labs  ?Lab 10/10/21 ?1707 10/10/21 ?1736 10/10/21 ?1951 10/11/21 ?0602 10/12/21 ?0745 10/13/21 ?0838  ?WBC 24.8*  --   --  23.8* 14.3*  --   ?CREATININE 1.10  --   --  0.91 0.84 0.77  ?LATICACIDVEN  --  3.0* 3.0* 1.0  --   --   ? ?  ?Estimated Creatinine Clearance: 65.3 mL/min (by C-G formula based on SCr of 0.77 mg/dL).   ? ?No Known Allergies ? ?Antimicrobials this admission: ?Ceftriaxone 4/28x1 ?Unasyn 4/28>> ?Azith 4/28>>5/2 ? ?Microbiology results: ?4/28 blood>> ngtd ?4/28 urine>>no growth ?4/28 strep pneumo UA is negative ?4/28 Legionella is negative ? ?Isac Sarna, BS Pharm D, BCPS ?Clinical Pharmacist ?10/13/2021 10:31 AM ? ? ? ?

## 2021-10-13 NOTE — Evaluation (Signed)
Clinical/Bedside Swallow Evaluation ?Patient Details  ?Name: Jacob Little ?MRN: [id number] ?Date of Birth: [birth year] ? ?Today's Date: 10/13/2021 ?Time: SLP Start Time (ACUTE ONLY): 3710 SLP Stop Time (ACUTE ONLY): 1251 ?SLP Time Calculation (min) (ACUTE ONLY): 31 min ? ?Past Medical History:  ?Past Medical History:  ?Diagnosis Date  ? Atherosclerotic heart disease   ? Cerebral embolism with cerebral infarction [facility])   ? From LTC paperwork  ? Coronary artery disease   ? DES to circumflex 2006  ? Dementia ([place])   ? Encephalopathy   ? Essential hypertension   ? Hyperlipidemia   ? Rheumatic fever   ? Stroke [facility])   ? Type 2 diabetes mellitus ([place])   ? ?Past Surgical History:  ?Past Surgical History:  ?Procedure Laterality Date  ? APPENDECTOMY    ? CORONARY ANGIOPLASTY WITH STENT PLACEMENT    ? ?HPI:  ?Jacob Little  is a 86 y.o. male,with medical history significant for history of CVA, dementia, type 2 diabetes mellitus, patient with recent hospitalization in February secondary to sepsis from pneumonia(work-up with low risk of aspiration), and hypoglycemia last March, he was seen today by facility for shortness of breath.  -Patient is poor historian, with dementia, unable to provide any history, it was obtained from ED staff, patient brought by EMS for shortness of breath, was hypoxic in low 80s on room air, he was placed on nonrebreather, on arrival his oxygen 90% on room air, he was noted by staff at the facility with difficulty breathing, increased congestion in his lungs for last 2 weeks, he had progressive decline since, and he is more agitated and confused compared to his baseline.  -In ED chest x-ray significant for bilateral lung disease, with elevated lactic acid of 3, leukocytosis of 24.8, patient was started on pneumonia antibiotic coverage and [facility] hospitalist consulted to admit. Pt known to SLP service from previous admissions and MBSS 08/05/21 with recommendation for regular and thin. BSE requested.   ? ?MBSS from 08/05/21 ?<<Pt presents with moderate primary cognitive based oral dysphagia and mild pharyngeal dysphagia. Pt was provided sedating medication this am ~4 hrs prior to the study which likely contributed to his lethargic presentation; Pt required frequent tactile and verbal cues to maintain appropriate positioning and alertness. Despite lethargy and sub-optimal positioning, no penetration or aspiration was visualized during this study. Oral phase is characterized by lingual pumping and prolonged AP transit, further note mild oral residue after the initial swallow. Pharyngeal phase is characterized by good laryngeal vestibule closure, good hyolaryngeal excursion and fully intact pharyngeal stripping wave, however occasional trace to min pharyngeal residue is noted after initial swallow and cleared with second swallow cued& reflexive. Pt was presented with barium tablet with puree and despite multiple presentations of thin and puree was unable to swallow tablet and it was ultimately expectorated; suspect inability to coordinate swallow of tablet was related to current mentation. Recommend upgrade Pt's diet to regular with thin liquids. Please ensure Pt is alert and responsive and sitting upright for all PO. Recommend meds be administered crushed with puree. There are no further ST needs noted at this time. Results reviewed with RN and MD.>> ?  ?Assessment / Plan / Recommendation  ?Clinical Impression ? Clinical swallow evaluation completed with Pt seating in recliner. Pt presents with generalized weakness and poor trunk support (leaning to his right and mod/max assist to move upright). Pt with sparse dentition and dried oral secretions. He produced volitional swallow upon SLP request and weak congested cough (oral  suction completed, however secretions appear deeper). Pt assessed with ice chips, thin water via tsp/cup/straw, puree and mechanical soft textures. Pt with reduced labial seal and reduced lingual  movement resulting in labial spillage with cup sips of thin and prolonged oral transit with purees and mech soft textures. Pt without overt couging during po intake despite being challenged with straw sips. Pt was seen for MBSS in February of 2023 and appears to have similar clinical appearance to this admission. Will initiate D2 and thin liquids via cup/straw and po medications whole in puree when Pt is alert and upright. Pt will need 100% supervision and feeder asist with for all po intake. SLP will follow during acute stay. ?SLP Visit Diagnosis: Dysphagia, unspecified (R13.10) ?   ?Aspiration Risk ? Moderate aspiration risk;Risk for inadequate nutrition/hydration  ?  ?Diet Recommendation Dysphagia 2 (Fine chop);Thin liquid  ? ?Liquid Administration via: Cup;Straw ?Medication Administration: Whole meds with puree ?Supervision: Staff to assist with self feeding;Full supervision/cueing for compensatory strategies ?Compensations: Slow rate;Small sips/bites ?Postural Changes: Seated upright at 90 degrees;Remain upright for at least 30 minutes after po intake  ?  ?Other  Recommendations Oral Care Recommendations: Oral care BID;Staff/trained caregiver to provide oral care ?Other Recommendations: Clarify dietary restrictions   ? ?Recommendations for follow up therapy are one component of a multi-disciplinary discharge planning process, led by the attending physician.  Recommendations may be updated based on patient status, additional functional criteria and insurance authorization. ? ?Follow up Recommendations Skilled nursing-short term rehab (<3 hours/day)  ? ? ?  ?Assistance Recommended at Discharge Frequent or constant Supervision/Assistance  ?Functional Status Assessment Patient has had a recent decline in their functional status and demonstrates the ability to make significant improvements in function in a reasonable and predictable amount of time.  ?Frequency and Duration min 2x/week  ?1 week ?  ?   ? ?Prognosis  Prognosis for Safe Diet Advancement: Fair ?Barriers to Reach Goals: Severity of deficits  ? ?  ? ?Swallow Study   ?General Date of Onset: 10/10/21 ?HPI: Marcellous Snarski  is a 86 y.o. male,with medical history significant for history of CVA, dementia, type 2 diabetes mellitus, patient with recent hospitalization in February secondary to sepsis from pneumonia(work-up with low risk of aspiration), and hypoglycemia last March, he was seen today by facility for shortness of breath.  -Patient is poor historian, with dementia, unable to provide any history, it was obtained from ED staff, patient brought by EMS for shortness of breath, was hypoxic in low 80s on room air, he was placed on nonrebreather, on arrival his oxygen 90% on room air, he was noted by staff at the facility with difficulty breathing, increased congestion in his lungs for last 2 weeks, he had progressive decline since, and he is more agitated and confused compared to his baseline.  -In ED chest x-ray significant for bilateral lung disease, with elevated lactic acid of 3, leukocytosis of 24.8, patient was started on pneumonia antibiotic coverage and [facility] hospitalist consulted to admit. Pt known to SLP service from previous admissions and MBSS 08/05/21 with recommendation for regular and thin. BSE requested. ?Type of Study: Bedside Swallow Evaluation ?Previous Swallow Assessment: MBSS 08/05/21 reg/thin ?Diet Prior to this Study: NPO ?Temperature Spikes Noted: No ?Respiratory Status: Nasal cannula ?History of Recent Intubation: No ?Behavior/Cognition: Alert;Cooperative;Requires cueing ?Oral Cavity Assessment: Dried secretions ?Oral Care Completed by SLP: Yes ?Oral Cavity - Dentition: Poor condition;Missing dentition (sparse dentition) ?Vision: Functional for self-feeding ?Self-Feeding Abilities: Needs assist ?Patient Positioning: Upright in  chair ?Baseline Vocal Quality: Low vocal intensity ?Volitional Cough: Congested;Weak ?Volitional Swallow: Able to elicit   ?  ?Oral/Motor/Sensory Function Overall Oral Motor/Sensory Function: Generalized oral weakness   ?Ice Chips Ice chips: Within functional limits ?Presentation: Spoon   ?Thin Liquid Thin Liquid: Impaired ?Pre

## 2021-10-13 NOTE — Evaluation (Signed)
Physical Therapy Evaluation Patient Details Name: Jacob Little MRN: [id number] DOB: 10/30/1935 Today's Date: 10/13/2021  History of Present Illness  Jacob Little  is a 86 y.o. male,with medical history significant for history of CVA, dementia, type 2 diabetes mellitus, patient with recent hospitalization in February secondary to sepsis from pneumonia(work-up with low risk of aspiration), and hypoglycemia last March, he was seen today by facility for shortness of breath.  -Patient is poor historian, with dementia, unable to provide any history, it was obtained from ED staff, patient brought by EMS for shortness of breath, was hypoxic in low 80s on room air, he was placed on nonrebreather, on arrival his oxygen 90% on room air, he was noted by staff at the facility with difficulty breathing, increased congestion in his lungs for last 2 weeks, he had progressive decline since, and he is more agitated and confused compared to his baseline.  -In ED chest x-ray significant for bilateral lung disease, with elevated lactic acid of 3, leukocytosis of 24.8, patient was started on pneumonia antibiotic coverage and [facility] hospitalist consulted to admit.   Clinical Impression  Patient presents slightly lethargic and able to participate with therapy requiring repeated verbal/tactile cueing.  Patient demonstrates slow labored movement for sitting up at bedside with frequent right lateral leaning once seated, very unsteady on feet with poor tolerance for standing due to weakness requiring repeated attempts before able to take few steps to transfer to chair.  Patient tolerated sitting up in chair after therapy - nursing staff notified.  Patient will benefit from continued skilled physical therapy in hospital and recommended venue below to increase strength, balance, endurance for safe ADLs and gait.          Recommendations for follow up therapy are one component of a multi-disciplinary discharge planning process, led  by the attending physician.  Recommendations may be updated based on patient status, additional functional criteria and insurance authorization.  Follow Up Recommendations Skilled nursing-short term rehab (<3 hours/day)    Assistance Recommended at Discharge Intermittent Supervision/Assistance  Patient can return home with the following  A lot of help with walking and/or transfers;A lot of help with bathing/dressing/bathroom;Assistance with cooking/housework;Help with stairs or ramp for entrance    Equipment Recommendations None recommended by PT  Recommendations for Other Services       Functional Status Assessment Patient has had a recent decline in their functional status and demonstrates the ability to make significant improvements in function in a reasonable and predictable amount of time.     Precautions / Restrictions Precautions Precautions: Fall Precaution Comments: dementia, bilateral hand mitts. Restrictions Weight Bearing Restrictions: No      Mobility  Bed Mobility Overal bed mobility: Needs Assistance Bed Mobility: Supine to Sit     Supine to sit: Max assist     General bed mobility comments: slow labored movement    Transfers Overall transfer level: Needs assistance Equipment used: Rolling walker (2 wheels) Transfers: Sit to/from Stand, Bed to chair/wheelchair/BSC Sit to Stand: Max assist, Mod assist     Squat pivot transfers: Max assist     General transfer comment: unsteady shaky labored movement with buckling of knees    Ambulation/Gait Ambulation/Gait assistance: Max assist Gait Distance (Feet): 4 Feet Assistive device: Rolling walker (2 wheels) Gait Pattern/deviations: Decreased step length - right, Decreased step length - left, Decreased stride length, Trunk flexed, Knees buckling Gait velocity: decreased     General Gait Details: limited to a few slow labored unsteady labored  side steps due to BLE weakness and fatigue  Stairs             Wheelchair Mobility    Modified Rankin (Stroke Patients Only)       Balance Overall balance assessment: Needs assistance Sitting-balance support: Feet supported, No upper extremity supported Sitting balance-Leahy Scale: Poor Sitting balance - Comments: fair/poor seated at EOB Postural control: Right lateral lean, Posterior lean Standing balance support: Bilateral upper extremity supported, During functional activity Standing balance-Leahy Scale: Poor Standing balance comment: using RW                             Pertinent Vitals/Pain Pain Assessment Pain Assessment: No/denies pain    Home Living Family/patient expects to be discharged to:: Assisted living                 Home Equipment: Wheelchair - manual      Prior Function Prior Level of Function : Needs assist;Patient poor historian/Family not available  Cognitive Assist : Mobility (cognitive);ADLs (cognitive) Mobility (Cognitive): Intermittent cues   Physical Assist : Mobility (physical);ADLs (physical) Mobility (physical): Bed mobility;Transfers;Gait;Stairs ADLs (physical): Grooming;Bathing;Dressing;Toileting;IADLs Mobility Comments: Per chart review, patient used a wheelchair and/or RW at ALF with staff assisting. ADLs Comments: Assisted by ALF staff     Hand Dominance   Dominant Hand: Right    Extremity/Trunk Assessment   Upper Extremity Assessment Upper Extremity Assessment: Defer to OT evaluation    Lower Extremity Assessment Lower Extremity Assessment: Generalized weakness       Communication   Communication: Expressive difficulties  Cognition Arousal/Alertness: Lethargic Behavior During Therapy: Flat affect Overall Cognitive Status: History of cognitive impairments - at baseline                                          General Comments      Exercises     Assessment/Plan    PT Assessment Patient needs continued PT services  PT Problem List  Decreased strength;Decreased activity tolerance;Decreased balance;Decreased mobility       PT Treatment Interventions DME instruction;Gait training;Stair training;Functional mobility training;Therapeutic activities;Therapeutic exercise;Balance training;Patient/family education    PT Goals (Current goals can be found in the Care Plan section)  Acute Rehab PT Goals Patient Stated Goal: return home PT Goal Formulation: With patient Time For Goal Achievement: 10/27/21 Potential to Achieve Goals: Good    Frequency Min 3X/week     Co-evaluation PT/OT/SLP Co-Evaluation/Treatment: Yes Reason for Co-Treatment: Necessary to address cognition/behavior during functional activity;To address functional/ADL transfers PT goals addressed during session: Mobility/safety with mobility;Balance;Proper use of DME OT goals addressed during session: Strengthening/ROM;ADL's and self-care;Proper use of Adaptive equipment and DME       AM-PAC PT "6 Clicks" Mobility  Outcome Measure Help needed turning from your back to your side while in a flat bed without using bedrails?: A Lot Help needed moving from lying on your back to sitting on the side of a flat bed without using bedrails?: A Lot Help needed moving to and from a bed to a chair (including a wheelchair)?: A Lot Help needed standing up from a chair using your arms (e.g., wheelchair or bedside chair)?: A Lot Help needed to walk in hospital room?: A Lot Help needed climbing 3-5 steps with a railing? : Total 6 Click Score: 11    End of Session  Activity Tolerance: Patient tolerated treatment well;Patient limited by fatigue Patient left: in chair;with call bell/phone within reach;with chair alarm set Nurse Communication: Mobility status PT Visit Diagnosis: Unsteadiness on feet (R26.81);Other abnormalities of gait and mobility (R26.89);Muscle weakness (generalized) (M62.81)    Time: 1914-7829 PT Time Calculation (min) (ACUTE ONLY): 19  min   Charges:   PT Evaluation $PT Eval Low Complexity: 1 Low PT Treatments $Therapeutic Activity: 8-22 mins        1:54 PM, 10/13/21 Ocie Bob, MPT Physical Therapist with [facility] 336 [phone] office [phone] mobile phone

## 2021-10-13 NOTE — Consult Note (Signed)
? ?                                                                                ?Consultation Note ?Date: 10/13/2021  ? ?Patient Name: Jacob Little  ?DOB: 27-Jan-1936  MRN: [id number]  Age / Sex: 86 y.o., male  ?PCP: [name], MD ?Referring Physician: Deatra James, MD ? ?Reason for Consultation: Establishing goals of care ? ?HPI/Patient Profile: 86 y.o. male  with past medical history of CVA, dementia, DM 2, recent hospitalization in February with sepsis from pneumonia, CAD, HTN/HLD, resident of ALF since 2019 admitted on 10/10/2021 with sepsis due to pneumonia suspicious for aspiration.  ? ?Clinical Assessment and Goals of Care: ?I have reviewed medical records including EPIC notes, labs and imaging, received report from RN, assessed the patient.  Jacob Little is sitting up in the Hernandez chair in his room.  He has known dementia, but is able to mumble his name.  I do not ask orientation questions.  He cannot make his basic needs known.  His daughters Brayton Layman and Suanne Marker are at bedside.   ? ?We met at the bedside to discuss diagnosis prognosis, GOC, EOL wishes, disposition and options.  I introduced Palliative Medicine as specialized medical care for people living with serious illness. It focuses on providing relief from the symptoms and stress of a serious illness. The goal is to improve quality of life for both the patient and the family. ? ?We discussed a brief life review of the patient.  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  His wife has been deceased for 18+ years.  He has been a resident of [facility] since 2019.  He has 2 daughters.  At times, he no longer recognizes his daughters.   ? ?We then focused on their current illness.  We talk in detail about Jacob Little acute health concerns.  We talked about aspiration pneumonia and the likelihood that this will recur.  We talk about speech therapy consult and recommendations and the need for ongoing speech therapy.  We talk about the changes that are  normal and expected with memory loss including, but not limited to, mental status changes, physical changes, nutritional changes.  I share a diagram of the chronic illness pathway, what is normal and expected.  The natural disease trajectory and expectations at EOL were discussed. ? ?Advanced directives, concepts specific to code status, artifical feeding and hydration, and rehospitalization were considered and discussed.  Family endorses "treat the treatable, but allowing natural passing"/DNR.  We also talk about the concept of "let nature take its course".  I give examples of when this is appropriate.  I shared that it is not illegal nor unethical but some people have a moral problem with not doing everything.  I shared that just like I will get sick again, so will Jacob Little.  We talked about how to make choices for loved ones including 1) keeping them at the center of decision-making 2) are we doing something for him or to him can we change what is happening 3) the person Jacob Little was 5 years ago ...  how would that man tell them to care for  him now. ? ?Hospice and Palliative Care services outpatient were explained and offered.  We talked about the benefits of outpatient palliative services for continued goals of care discussion.  Family is agreeable to palliative services, provider choice offered, [facility].  We also talk about the benefits of hospice care. ? ?Discussed the importance of continued conversation with family and the medical providers regarding overall plan of care and treatment options, ensuring decisions are within the context of the patient?s values and GOCs.  Questions and concerns were addressed. The family was encouraged to call with questions or concerns.  PMT will continue to support holistically. ? ?Conference with attending, bedside nursing staff, transition of care team related to patient condition, needs, goals of care, disposition. ? ? ?HCPOA ?NEXT OF KIN -daughters, Brayton Layman  and Suanne Marker as a team. ?  ? ?SUMMARY OF RECOMMENDATIONS   ?At this point continue to treat the treatable but no CPR or intubation ?Would like a trial at short-term rehab. ?Outpatient palliative services with [facility]. ? ? ?Code Status/Advance Care Planning: ?DNR ? ?Symptom Management:  ?Per hospitalist, no additional needs at this time. ? ?Palliative Prophylaxis:  ?Oral Care and Turn Reposition ? ?Additional Recommendations (Limitations, Scope, Preferences): ?Treat the treatable but no CPR or intubation. ? ?Psycho-social/Spiritual:  ?Desire for further Chaplaincy support:no ?Additional Recommendations: Caregiving  Support/Resources and Education on Hospice ? ?Prognosis:  ?< 3 months, or less would not be surprising based on decreasing functional status, weight loss, recurrent aspiration pneumonia, advancing dementia. ? ?Discharge Planning:  At this point would like to a trial of short-term rehab and outpatient palliative services.   ? ?  ? ?Primary Diagnoses: ?Present on Admission: ? HLD (hyperlipidemia) ? CORONARY ATHEROSCLEROSIS NATIVE CORONARY ARTERY ? Essential hypertension ? Vascular dementia with behavior disturbance ([place]) ? Acute metabolic encephalopathy ? Diabetes mellitus with hyperglycemia, without long-term current use of insulin ([place]) ? Pneumonia ? ? ?I have reviewed the medical record, interviewed the patient and family, and examined the patient. The following aspects are pertinent. ? ?Past Medical History:  ?Diagnosis Date  ? Atherosclerotic heart disease   ? Cerebral embolism with cerebral infarction [facility])   ? From LTC paperwork  ? Coronary artery disease   ? DES to circumflex 2006  ? Dementia ([place])   ? Encephalopathy   ? Essential hypertension   ? Hyperlipidemia   ? Rheumatic fever   ? Stroke [facility])   ? Type 2 diabetes mellitus ([place])   ? ?Social History  ? ?Socioeconomic History  ? Marital status: Widowed  ?  Spouse name: Not on file  ? Number of children: Not on file  ? Years of education:  Not on file  ? Highest education level: Not on file  ?Occupational History  ? Not on file  ?Tobacco Use  ? Smoking status: Former  ?  Types: Cigarettes  ?  Quit date: 06/15/2005  ?  Years since quitting: 16.3  ? Smokeless tobacco: Never  ?Vaping Use  ? Vaping Use: Never used  ?Substance and Sexual Activity  ? Alcohol use: No  ?  Alcohol/week: 0.0 standard drinks  ? Drug use: No  ? Sexual activity: Not on file  ?Other Topics Concern  ? Not on file  ?Social History Narrative  ? Not on file  ? ?Social Determinants of Health  ? ?Financial Resource Strain: Not on file  ?Food Insecurity: Not on file  ?Transportation Needs: Not on file  ?Physical Activity: Not on file  ?Stress: Not on file  ?  Social Connections: Not on file  ? ?History reviewed. No pertinent family history. ?Scheduled Meds: ? azithromycin  500 mg Oral Daily  ? enoxaparin (LOVENOX) injection  40 mg Subcutaneous Q24H  ? ?Continuous Infusions: ? ampicillin-sulbactam (UNASYN) IV 3 g (10/13/21 1048)  ? dextrose 50 mL/hr at 10/13/21 0040  ? ?PRN Meds:.albuterol, haloperidol lactate ?Medications Prior to Admission:  ?Prior to Admission medications   ?Medication Sig Start Date End Date Taking? Authorizing Provider  ?acetaminophen (TYLENOL) 325 MG tablet Take 2 tablets (650 mg total) by mouth every 4 (four) hours as needed for mild pain (or temp > 37.5 C (99.5 F)). 11/12/17  Yes Barton Dubois, MD  ?albuterol (PROVENTIL HFA;VENTOLIN HFA) 108 (90 Base) MCG/ACT inhaler Inhale 2 puffs into the lungs every 6 (six) hours as needed for wheezing or shortness of breath. 03/16/18  Yes Kathie Dike, MD  ?ASPIRIN LOW DOSE 81 MG EC tablet TAKE (1) TABLET BY MOUTH ONCE DAILY. ?Patient taking differently: Take 81 mg by mouth daily. 09/20/20  Yes Satira Sark, MD  ?atorvastatin (LIPITOR) 20 MG tablet Take 20 mg by mouth daily.   Yes [provider]  ?cyanocobalamin (,VITAMIN B-12,) 1000 MCG/ML injection Inject 1,000 mcg into the muscle every 30 (thirty) days.  09/14/17   Yes [provider]  ?Dextromethorphan-guaiFENesin (TUSSIN DM PO) Take 5 mLs by mouth every 6 (six) hours as needed (cough/congestion).   Yes [provider]  ?ipratropium-albuterol (DUONEB)

## 2021-10-13 NOTE — Evaluation (Signed)
Occupational Therapy Evaluation Patient Details Name: Jacob Little MRN: [id number] DOB: 03/10/1936 Today's Date: 10/13/2021   History of Present Illness Jacob Little  is a 86 y.o. male,with medical history significant for history of CVA, dementia, type 2 diabetes mellitus, patient with recent hospitalization in February secondary to sepsis from pneumonia(work-up with low risk of aspiration), and hypoglycemia last March, he was seen today by facility for shortness of breath.  -Patient is poor historian, with dementia, unable to provide any history, it was obtained from ED staff, patient brought by EMS for shortness of breath, was hypoxic in low 80s on room air, he was placed on nonrebreather, on arrival his oxygen 90% on room air, he was noted by staff at the facility with difficulty breathing, increased congestion in his lungs for last 2 weeks, he had progressive decline since, and he is more agitated and confused compared to his baseline.  -In ED chest x-ray significant for bilateral lung disease, with elevated lactic acid of 3, leukocytosis of 24.8, patient was started on pneumonia antibiotic coverage and [facility] hospitalist consulted to admit.   Clinical Impression   Pt in bed sleeping upon therapy arrival. Able to wake patient up to assess functional transfers and physical ability. Patient did not converse with therapists although stated, "it hurts, " while seated on EOB. At this time, patient is requiring max Assist or more to complete basic ADL tasks and functional transfers. Decreased balance while seated and standing is demonstrated. Patient will benefit from discharge to SNF for therapy prior to returning to ALF. OT will follow patient acutely.       Recommendations for follow up therapy are one component of a multi-disciplinary discharge planning process, led by the attending physician.  Recommendations may be updated based on patient status, additional functional criteria and insurance  authorization.   Follow Up Recommendations  Skilled nursing-short term rehab (<3 hours/day)    Assistance Recommended at Discharge Frequent or constant Supervision/Assistance  Patient can return home with the following A lot of help with walking and/or transfers;A lot of help with bathing/dressing/bathroom;Assistance with cooking/housework;Assistance with feeding;Direct supervision/assist for medications management;Direct supervision/assist for financial management;Assist for transportation;Help with stairs or ramp for entrance    Functional Status Assessment  Patient has had a recent decline in their functional status and demonstrates the ability to make significant improvements in function in a reasonable and predictable amount of time.  Equipment Recommendations  Other (comment) (defer to next venue of care)       Precautions / Restrictions Precautions Precautions: Fall Precaution Comments: dementia, bilateral hand mitts. Restrictions Weight Bearing Restrictions: No      Mobility Bed Mobility Overal bed mobility: Needs Assistance Bed Mobility: Supine to Sit     Supine to sit: Max assist          Transfers Overall transfer level: Needs assistance Equipment used: Rolling walker (2 wheels) Transfers: Sit to/from Stand, Bed to chair/wheelchair/BSC Sit to Stand: Max assist   Squat pivot transfers: Max assist              Balance Overall balance assessment: Needs assistance Sitting-balance support: Feet supported, Single extremity supported Sitting balance-Leahy Scale: Zero   Postural control: Right lateral lean, Posterior lean Standing balance support: Bilateral upper extremity supported, During functional activity Standing balance-Leahy Scale: Zero Standing balance comment: during functional transfer from bed to recliner using RW       ADL either performed or assessed with clinical judgement   ADL Overall ADL's : Needs  assistance/impaired     Grooming:  Therapist, nutritional;Wash/dry hands;Bed level;Total assistance   Upper Body Bathing: Total assistance;Bed level   Lower Body Bathing: Total assistance;Bed level   Upper Body Dressing : Total assistance;Sitting   Lower Body Dressing: Total assistance;Bed level   Toilet Transfer: Maximal assistance;Squat-pivot;Rolling walker (2 wheels)   Toileting- Clothing Manipulation and Hygiene: Total assistance;Sit to/from stand Toileting - Clothing Manipulation Details (indicate cue type and reason): 1 person to stand, 1 person to assist with hygiene     Functional mobility during ADLs: Maximal assistance;Cueing for safety;Cueing for sequencing;Rolling walker (2 wheels)       Vision Baseline Vision/History:  (Unable to test) Ability to See in Adequate Light:  (Unable to test) Patient Visual Report:  (Unable to report) Additional Comments: Vision not tested     Perception Perception Perception: Not tested   Praxis Praxis Praxis: Not tested    Pertinent Vitals/Pain Pain Assessment Pain Assessment: PAINAD Breathing: normal Negative Vocalization: occasional moan/groan, low speech, negative/disapproving quality Facial Expression: smiling or inexpressive Body Language: tense, distressed pacing, fidgeting Consolability: distracted or reassured by voice/touch PAINAD Score: 3 Pain Intervention(s): Monitored during session     Hand Dominance Right   Extremity/Trunk Assessment Upper Extremity Assessment Upper Extremity Assessment: Overall WFL for tasks assessed   Lower Extremity Assessment Lower Extremity Assessment: Defer to PT evaluation       Communication Communication Communication: Expressive difficulties   Cognition Arousal/Alertness: Lethargic Behavior During Therapy: Flat affect Overall Cognitive Status: No family/caregiver present to determine baseline cognitive functioning         General Comments: Pt has a history of cognitive deficits due to dementia                 Home Living Family/patient expects to be discharged to:: Assisted living       Home Equipment: Wheelchair - manual          Prior Functioning/Environment Prior Level of Function : Needs assist;Patient poor historian/Family not available  Cognitive Assist : Mobility (cognitive);ADLs (cognitive)     Physical Assist : Mobility (physical);ADLs (physical) Mobility (physical): Bed mobility;Transfers;Gait;Stairs ADLs (physical): Grooming;Bathing;Dressing;Toileting;IADLs Mobility Comments: Per chart review, patient used a wheelchair and/or RW at ALF with staff assisting. ADLs Comments: Assisted by ALF staff        OT Problem List: Decreased activity tolerance;Decreased safety awareness;Impaired balance (sitting and/or standing);Decreased knowledge of use of DME or AE      OT Treatment/Interventions: Self-care/ADL training;Therapeutic exercise;Therapeutic activities;Cognitive remediation/compensation;Neuromuscular education;DME and/or AE instruction;Patient/family education;Balance training;Manual therapy;Modalities    OT Goals(Current goals can be found in the care plan section) Acute Rehab OT Goals Patient Stated Goal: none stated OT Goal Formulation: Patient unable to participate in goal setting Time For Goal Achievement: 10/27/21 Potential to Achieve Goals: Good  OT Frequency: Min 2X/week    Co-evaluation PT/OT/SLP Co-Evaluation/Treatment: Yes Reason for Co-Treatment: Necessary to address cognition/behavior during functional activity;To address functional/ADL transfers   OT goals addressed during session: Strengthening/ROM;ADL's and self-care;Proper use of Adaptive equipment and DME      AM-PAC OT "6 Clicks" Daily Activity     Outcome Measure Help from another person eating meals?: A Lot Help from another person taking care of personal grooming?: Total Help from another person toileting, which includes using toliet, bedpan, or urinal?: Total Help from another person  bathing (including washing, rinsing, drying)?: Total Help from another person to put on and taking off regular upper body clothing?: Total Help from another person to put on and taking  off regular lower body clothing?: Total 6 Click Score: 7   End of Session Equipment Utilized During Treatment: Gait belt;Rolling walker (2 wheels) Nurse Communication: Mobility status  Activity Tolerance: Patient tolerated treatment well Patient left: in chair;with call bell/phone within reach;with chair alarm set  OT Visit Diagnosis: Unsteadiness on feet (R26.81);Repeated falls (R29.6)                Time: 5409-8119 OT Time Calculation (min): 18 min Charges:  OT General Charges $OT Visit: 1 Visit OT Evaluation $OT Eval Moderate Complexity: 1 [address] OTR/L,CBIS  [phone]   Kallen Mccrystal, Charisse March 10/13/2021, 10:04 AM

## 2021-10-14 ENCOUNTER — Other Ambulatory Visit (HOSPITAL_COMMUNITY): Payer: Self-pay

## 2021-10-14 DIAGNOSIS — E0865 Diabetes mellitus due to underlying condition with hyperglycemia: Secondary | ICD-10-CM | POA: Diagnosis not present

## 2021-10-14 DIAGNOSIS — I2510 Atherosclerotic heart disease of native coronary artery without angina pectoris: Secondary | ICD-10-CM | POA: Diagnosis not present

## 2021-10-14 DIAGNOSIS — R293 Abnormal posture: Secondary | ICD-10-CM | POA: Diagnosis not present

## 2021-10-14 DIAGNOSIS — Z7401 Bed confinement status: Secondary | ICD-10-CM | POA: Diagnosis not present

## 2021-10-14 DIAGNOSIS — R5381 Other malaise: Secondary | ICD-10-CM | POA: Diagnosis not present

## 2021-10-14 DIAGNOSIS — R531 Weakness: Secondary | ICD-10-CM | POA: Diagnosis not present

## 2021-10-14 DIAGNOSIS — E785 Hyperlipidemia, unspecified: Secondary | ICD-10-CM | POA: Diagnosis not present

## 2021-10-14 DIAGNOSIS — F01518 Vascular dementia, unspecified severity, with other behavioral disturbance: Secondary | ICD-10-CM | POA: Diagnosis not present

## 2021-10-14 DIAGNOSIS — G9341 Metabolic encephalopathy: Secondary | ICD-10-CM | POA: Diagnosis not present

## 2021-10-14 DIAGNOSIS — J189 Pneumonia, unspecified organism: Secondary | ICD-10-CM | POA: Diagnosis not present

## 2021-10-14 DIAGNOSIS — R1310 Dysphagia, unspecified: Secondary | ICD-10-CM | POA: Diagnosis not present

## 2021-10-14 DIAGNOSIS — M6281 Muscle weakness (generalized): Secondary | ICD-10-CM | POA: Diagnosis not present

## 2021-10-14 DIAGNOSIS — I10 Essential (primary) hypertension: Secondary | ICD-10-CM | POA: Diagnosis not present

## 2021-10-14 LAB — CBC
HCT: 36.2 % — ABNORMAL LOW (ref 39.0–52.0)
Hemoglobin: 12.3 g/dL — ABNORMAL LOW (ref 13.0–17.0)
MCH: 31.9 pg (ref 26.0–34.0)
MCHC: 34 g/dL (ref 30.0–36.0)
MCV: 94 fL (ref 80.0–100.0)
Platelets: 315 K/uL (ref 150–400)
RBC: 3.85 MIL/uL — ABNORMAL LOW (ref 4.22–5.81)
RDW: 12.6 % (ref 11.5–15.5)
WBC: 14 K/uL — ABNORMAL HIGH (ref 4.0–10.5)
nRBC: 0 % (ref 0.0–0.2)

## 2021-10-14 LAB — BASIC METABOLIC PANEL WITH GFR
Anion gap: 7 (ref 5–15)
BUN: 15 mg/dL (ref 8–23)
CO2: 24 mmol/L (ref 22–32)
Calcium: 8.3 mg/dL — ABNORMAL LOW (ref 8.9–10.3)
Chloride: 108 mmol/L (ref 98–111)
Creatinine, Ser: 0.76 mg/dL (ref 0.61–1.24)
GFR, Estimated: >60 mL/min
Glucose, Bld: 105 mg/dL — ABNORMAL HIGH (ref 70–99)
Potassium: 3.7 mmol/L (ref 3.5–5.1)
Sodium: 139 mmol/L (ref 135–145)

## 2021-10-14 LAB — GLUCOSE, CAPILLARY
Glucose-Capillary: 106 mg/dL — ABNORMAL HIGH (ref 70–99)
Glucose-Capillary: 116 mg/dL — ABNORMAL HIGH (ref 70–99)
Glucose-Capillary: 118 mg/dL — ABNORMAL HIGH (ref 70–99)
Glucose-Capillary: 96 mg/dL (ref 70–99)

## 2021-10-14 MED ORDER — POTASSIUM CHLORIDE CRYS ER 20 MEQ PO TBCR
20.0000 meq | EXTENDED_RELEASE_TABLET | Freq: Once | ORAL | Status: AC
  Administered 2021-10-14: 20 meq via ORAL
  Filled 2021-10-14: qty 1

## 2021-10-14 MED ORDER — LEVOFLOXACIN 500 MG PO TABS
500.0000 mg | ORAL_TABLET | Freq: Every day | ORAL | 0 refills | Status: AC
  Filled 2021-10-14: qty 3, 3d supply, fill #0

## 2021-10-14 MED ORDER — AMLODIPINE BESYLATE 5 MG PO TABS
5.0000 mg | ORAL_TABLET | Freq: Every day | ORAL | Status: DC
  Administered 2021-10-14: 5 mg via ORAL
  Filled 2021-10-14: qty 1

## 2021-10-14 NOTE — Care Management Important Message (Signed)
Important Message ? ?Patient Details  ?Name: Jacob Little ?MRN: [id number] ?Date of Birth: 12/18/1935 ? ? ?Medicare Important Message Given:  N/A - LOS <3 / Initial given by admissions ? ? ? ? ?Tommy Medal ?10/14/2021, 11:42 AM ?

## 2021-10-14 NOTE — Progress Notes (Signed)
Speech Language Pathology Treatment: Dysphagia  ?Patient Details ?Name: Jacob Little ?MRN: [id number] ?DOB: May 28, 1936 ?Today's Date: 10/14/2021 ?Time: 1000-1033 ?SLP Time Calculation (min) (ACUTE ONLY): 33 min ? ?Assessment / Plan / Recommendation ?Clinical Impression ? Ongoing diagnostic dysphagia therapy provided today; Pt was roused and repositioned in bed and SLP provided trials of D2 breakfast tray. Pt with prolonged mastication and poor coordination of solid textures; further suspect delayed swallow trigger. Pt consumed thin liquids via cup (hand over hand) with immediate wet coughing with each presentation. SLP provided trials of NTL and note no immediate wet coughing. This presentation was very different than BSE - question if Pt's mentation and lethargy could be negatively impacting airway protection (note recent MBSS revealed no penetration or aspiration). Question change in function since that time. ST will continue to follow acutely and monitor for possible need for repeat MBSS. Recommend downgrade to NECTAR liquids, continue with D2 and recommend crush meds with puree. ST will continue to follow, thank you ? ?  ?HPI HPI: Jacob Little  is a 86 y.o. male,with medical history significant for history of CVA, dementia, type 2 diabetes mellitus, patient with recent hospitalization in February secondary to sepsis from pneumonia(work-up with low risk of aspiration), and hypoglycemia last March, he was seen today by facility for shortness of breath.  -Patient is poor historian, with dementia, unable to provide any history, it was obtained from ED staff, patient brought by EMS for shortness of breath, was hypoxic in low 80s on room air, he was placed on nonrebreather, on arrival his oxygen 90% on room air, he was noted by staff at the facility with difficulty breathing, increased congestion in his lungs for last 2 weeks, he had progressive decline since, and he is more agitated and confused compared to his  baseline.  -In ED chest x-ray significant for bilateral lung disease, with elevated lactic acid of 3, leukocytosis of 24.8, patient was started on pneumonia antibiotic coverage and [facility] hospitalist consulted to admit. Pt known to SLP service from previous admissions and MBSS 08/05/21 with recommendation for regular and thin. BSE requested. ?  ?   ?SLP Plan ? Continue with current plan of care ? ?  ?  ?Recommendations for follow up therapy are one component of a multi-disciplinary discharge planning process, led by the attending physician.  Recommendations may be updated based on patient status, additional functional criteria and insurance authorization. ?  ? ?Recommendations  ?Diet recommendations: Dysphagia 2 (fine chop);Nectar-thick liquid ?Liquids provided via: Cup ?Medication Administration: Crushed with puree ?Supervision: Full supervision/cueing for compensatory strategies;Staff to assist with self feeding ?Compensations: Slow rate;Small sips/bites  ?   ?    ?   ? ? ? ? Oral Care Recommendations: Oral care BID;Staff/trained caregiver to provide oral care ?Follow Up Recommendations: Skilled nursing-short term rehab (<3 hours/day) ?Assistance recommended at discharge: Frequent or constant Supervision/Assistance ?SLP Visit Diagnosis: Dysphagia, unspecified (R13.10) ?Plan: Continue with current plan of care ? ? ? ? ?  ?  ?Perel Hauschild H. Izora Ribas MA, CCC-SLP ?Speech Language Pathologist ? ? ?Wende Bushy ? ?10/14/2021, 10:33 AM ?

## 2021-10-14 NOTE — TOC Transition Note (Signed)
Transition of Care (TOC) - CM/SW Discharge Note ? ? ?Patient Details  ?Name: Jacob Little ?MRN: [id number] ?Date of Birth: 1935-08-30 ? ?Transition of Care (TOC) CM/SW Contact:  ?Shade Flood, LCSW ?Phone Number: ?10/14/2021, 12:35 PM ? ? ?Clinical Narrative:    ? ?Pt stable for dc to [facility] today per MD. Damaris Schooner with Jackelyn Poling at [facility] to update and they can accept pt today. Updated pt's daughter, Brayton Layman, who remains in agreement with the dc plan.  ? ?DC clinical sent electronically. RN to call report. EMS arranged. There are no other TOC needs for dc. ? ?Final next level of care: [place] ?Barriers to Discharge: Barriers Resolved ? ? ?Patient Goals and CMS Choice ?Patient states their goals for this hospitalization and ongoing recovery are:: rehab ?CMS Medicare.gov Compare Post Acute Care list provided to:: Patient Represenative (must comment) ?Choice offered to / list presented to : Adult Children, HC POA / Guardian ? ?Discharge Placement ?  ?           ?Patient chooses bed at: Other - please specify in the comment section below: ([facility]) ?Patient to be transferred to facility by: EMS ?Name of family member notified: Brayton Layman ?Patient and family notified of of transfer: 10/14/21 ? ?Discharge Plan and Services ?In-house Referral: Clinical Social Work ?  ?Post Acute Care Choice: [place]          ?  ?  ?  ?  ?  ?  ?  ?  ?  ?  ? ?Social Determinants of Health (SDOH) Interventions ?  ? ? ?Readmission Risk Interventions ? ?  10/13/2021  ? 10:01 AM 08/05/2021  ?  1:18 PM  ?Readmission Risk Prevention Plan  ?Transportation Screening Complete Complete  ?PCP or Specialist Appt within 5-7 Days  Complete  ?Home Care Screening  Complete  ?Medication Review (RN CM)  Complete  ?[place] or Home Care Consult Complete   ?Social Work Consult for [place] Planning/Counseling Complete   ?Palliative Care Screening Not Applicable   ?Medication Review [employer] photographer) Complete    ? ? ? ? ? ?

## 2021-10-14 NOTE — Discharge Summary (Signed)
?Physician Discharge Summary ?  ?Patient: Jacob Little MRN: [id number] DOB: [birth year]  ?Admit date:     10/10/2021  ?Discharge date: 10/14/21  ?Discharge Physician: Valeria Batman Martavion Couper  ? ?PCP: [name], MD  ? ?Recommendations at discharge:  ?Follow-up with PCP within 1 week,  ?follow-up with the palliative care team within 1 week. ? ?Discharge Diagnoses: ?Principal Problem: ?  Pneumonia ?Active Problems: ?  HLD (hyperlipidemia) ?  CORONARY ATHEROSCLEROSIS NATIVE CORONARY ARTERY ?  Essential hypertension ?  Diabetes mellitus with hyperglycemia, without long-term current use of insulin ([place]) ?  Vascular dementia with behavior disturbance ([place]) ?  Acute metabolic encephalopathy ? ?Resolved Problems: ?  * No resolved hospital problems. * ? ?Hospital Course: ? ?Jacob Little  is a 86 y.o. male,with medical history significant for history of CVA, dementia, type 2 diabetes mellitus, patient with recent hospitalization in February secondary to sepsis from pneumonia(work-up with low risk of aspiration), and hypoglycemia last March, he was seen today by facility for shortness of breath. ?-Patient is poor historian, with dementia, unable to provide any history, it was obtained from ED staff, patient brought by EMS for shortness of breath, was hypoxic in low 80s on room air, he was placed on nonrebreather, on arrival his oxygen 90% on room air, he was noted by staff at the facility with difficulty breathing, increased congestion in his lungs for last 2 weeks, he had progressive decline since, and he is more agitated and confused compared to his baseline. ?-In ED chest x-ray significant for bilateral lung disease, with elevated lactic acid of 3, leukocytosis of 24.8, patient was started on pneumonia antibiotic coverage and [facility] hospitalist consulted to admit. ?  ?  ?  ? Assessment & Plan:  ?  ? Principal Problem: ?  Pneumonia ?Active Problems: ?  HLD (hyperlipidemia) ?  CORONARY ATHEROSCLEROSIS NATIVE CORONARY ARTERY ?   Essential hypertension ?  Diabetes mellitus with hyperglycemia, without long-term current use of insulin ([place]) ?  Vascular dementia with behavior disturbance ([place]) ?  Acute metabolic encephalopathy ?  ?  ?Sepsis POA due to pneumonia, suspicious for aspiration ?-Resolved sepsis physiology ?-Still confused likely baseline due to advanced dementia ?-Sepsis present on admission, significant for worsening encephalopathy, lactic acidosis and leukocytosis - ?-Sepsis-likely source of infection - right lower lung pneumonia, possible aspiration,  ?-Was on IV antibiotics of Unasyn/azithromycin >>> switch to p.o. Levaquin ?-Sputum culture -no results ?-Blood cultures no growth to date ?-Status post speech evaluation, dysphagia 3 recommended ?-We will consult SLP pending ?  ?  ?Acute metabolic encephalopathy/vascular dementia ?-Agitated and confused ?-Mentation - likely close to baseline ?-Patient with known underlying vascular dementia, but he is currently more confused and agitated related to his infectious process ?-Continue home dose Risperdal, will add as needed haloperidol as well ?  ?Dysphagia : ?Status post speech evaluation recommendations: ?Diet Recommendation Dysphagia 2 (Fine chop);Thin liquid  ?  ?Liquid Administration via: Cup;Straw ?Medication Administration: Whole meds with puree ?Supervision: Staff to assist with self feeding;Full supervision/cueing for compensatory strategies ?Compensations: Slow rate;Small sips/bites ?Postural Changes: Seated upright at 90 degrees;Remain upright for at least 30 minutes after po intake   ? ? ? ?history of CVA ?-With underlying vascular dementia, continue with supportive care ?-Continue with aspirin and statin ?  ?Hypertension ?- Continue with Imdur and metoprolol ? ?  ?Type 2 diabetes mellitus ?-Resume Januvia,  ?-Check CBG q. ACH S with SSI coverage ?  ?History of CAD ?-Continue with aspirin, statin, Imdur and metoprolol ?  ?  ?  Ethics:  patient is elderly male with advanced  dementia and multiple comorbidities ?Prognosis remain poor, high risk for steep decline and demise.  ?CODE STATUS-  DNR ?Consulting palliative care for determining goals of care ?If patient has an acute decline, recommending hospice.  ?  ?  ? ?Code Status DNR discussed with the daughter. ?  ?Likely DC to  back to SNF ?Follow-up with palliative care ? ? ? ? ? ?Consultants: none  ?Procedures performed: none  ?Disposition: Skilled nursing facility ?Diet recommendation:  ?Discharge Diet Orders (From admission, onward)  ? ?  Start   Dysphagia 2 diet Ordered  ? 10/14/21 0000  Dysphagia 2 diet ?Diet - low sodium heart healthy       ? 10/14/21 1101  ? ?  ?  ? ?  ? ?Regular diet ?DISCHARGE MEDICATION: ?Allergies as of 10/14/2021   ?No Known Allergies ?  ? ?  ?Medication List  ?  ? ?STOP taking these medications   ? ?TUSSIN DM PO ?  ? ?  ? ?TAKE these medications   ? ?acetaminophen 325 MG tablet ?Commonly known as: TYLENOL ?Take 2 tablets (650 mg total) by mouth every 4 (four) hours as needed for mild pain (or temp > 37.5 C (99.5 F)). ?  ?albuterol 108 (90 Base) MCG/ACT inhaler ?Commonly known as: VENTOLIN HFA ?Inhale 2 puffs into the lungs every 6 (six) hours as needed for wheezing or shortness of breath. ?  ?Aspirin Low Dose 81 MG EC tablet ?Generic drug: aspirin ?TAKE (1) TABLET BY MOUTH ONCE DAILY. ?What changed: See the new instructions. ?  ?atorvastatin 20 MG tablet ?Commonly known as: LIPITOR ?Take 20 mg by mouth daily. ?  ?cyanocobalamin 1000 MCG/ML injection ?Commonly known as: (VITAMIN B-12) ?Inject 1,000 mcg into the muscle every 30 (thirty) days. ?  ?ipratropium-albuterol 0.5-2.5 (3) MG/3ML Soln ?Commonly known as: DUONEB ?Take 3 mLs by nebulization 2 (two) times daily. ?  ?isosorbide mononitrate 30 MG 24 hr tablet ?Commonly known as: IMDUR ?Take 0.5 tablets (15 mg total) by mouth 2 (two) times daily. ?  ?Januvia 100 MG tablet ?Generic drug: sitaGLIPtin ?Take 1 tablet by mouth daily. ?  ?levofloxacin 500 MG  tablet ?Commonly known as: Levaquin ?Take 1 tablet (500 mg total) by mouth daily for 3 days. ?  ?metoprolol tartrate 25 MG tablet ?Commonly known as: LOPRESSOR ?Take 0.5 tablets (12.5 mg total) by mouth 2 (two) times daily. ?  ?nitroGLYCERIN 0.4 MG SL tablet ?Commonly known as: NITROSTAT ?Place 0.4 mg under the tongue every 5 (five) minutes as needed for chest pain. ?  ?risperiDONE 0.5 MG tablet ?Commonly known as: RISPERDAL ?Take 0.5 mg by mouth 2 (two) times daily. ?  ? ?  ? ? Contact information for after-discharge care   ? ? Destination   ? ? [facility] .   ?Service: Skilled Nursing ?Contact information: ?[place] ?[address] ?[phone] ? ?  ?  ? ?  ?  ? ?  ?  ? ?  ? ?Discharge Exam: ?Filed Weights  ? 10/10/21 1800  ?Weight: 66 kg  ? ? ? ? ?Physical Exam: ?  ?General:  AAO x 1, severely demented, but cooperative, no distress;   ?HEENT:  Normocephalic, PERRL, otherwise with in Normal limits   ?Neuro:  Limited exam due to advanced dementia  ?CNII-XII intact. , normal motor and sensation, reflexes intact   ?Lungs:   Clear to auscultation BL, Respirations unlabored,  ?No wheezes /  crackles  ?Cardio:    S1/S2, RRR, No murmure, No Rubs or Gallops   ?Abdomen:  Soft, non-tender, bowel sounds active all four quadrants, ?no guarding or peritoneal signs.  ?Muscular  ?skeletal:  Limited exam -sever global generalized weaknesses ?- in bed, able to move all 4 extremities,   ?2+ pulses,  symmetric, No pitting edema  ?Skin:  Dry, warm to touch, negative for any Rashes,  ?Wounds: Please see nursing documentation ?   ? ? ?  ? ? ?Condition at discharge: stable ? ?The results of significant diagnostics from this hospitalization (including imaging, microbiology, ancillary and laboratory) are listed below for reference.  ? ?Imaging Studies: ?CT Head Wo Contrast ? ?Result Date: 10/10/2021 ?CLINICAL DATA:  Altered mental status, difficulty breathing EXAM: CT HEAD WITHOUT  CONTRAST TECHNIQUE: Contiguous axial images were obtained from the base of the skull through the vertex without intravenous contrast. RADIATION DOSE REDUCTION: This exam was performed according to the departmental d

## 2021-10-16 LAB — CULTURE, BLOOD (ROUTINE X 2)
Culture: NO GROWTH
Culture: NO GROWTH
Special Requests: ADEQUATE

## 2021-11-13 DEATH — deceased

## 2022-01-01 IMAGING — DX DG CHEST 1V PORT
1 series · 1 of 1 positions shown · non-contrast
Comparison: Chest radiograph 10/04/2019

CLINICAL DATA: Cough.  Generalized weakness.  Former smoker.

EXAM:
PORTABLE CHEST 1 VIEW

[chest ap]
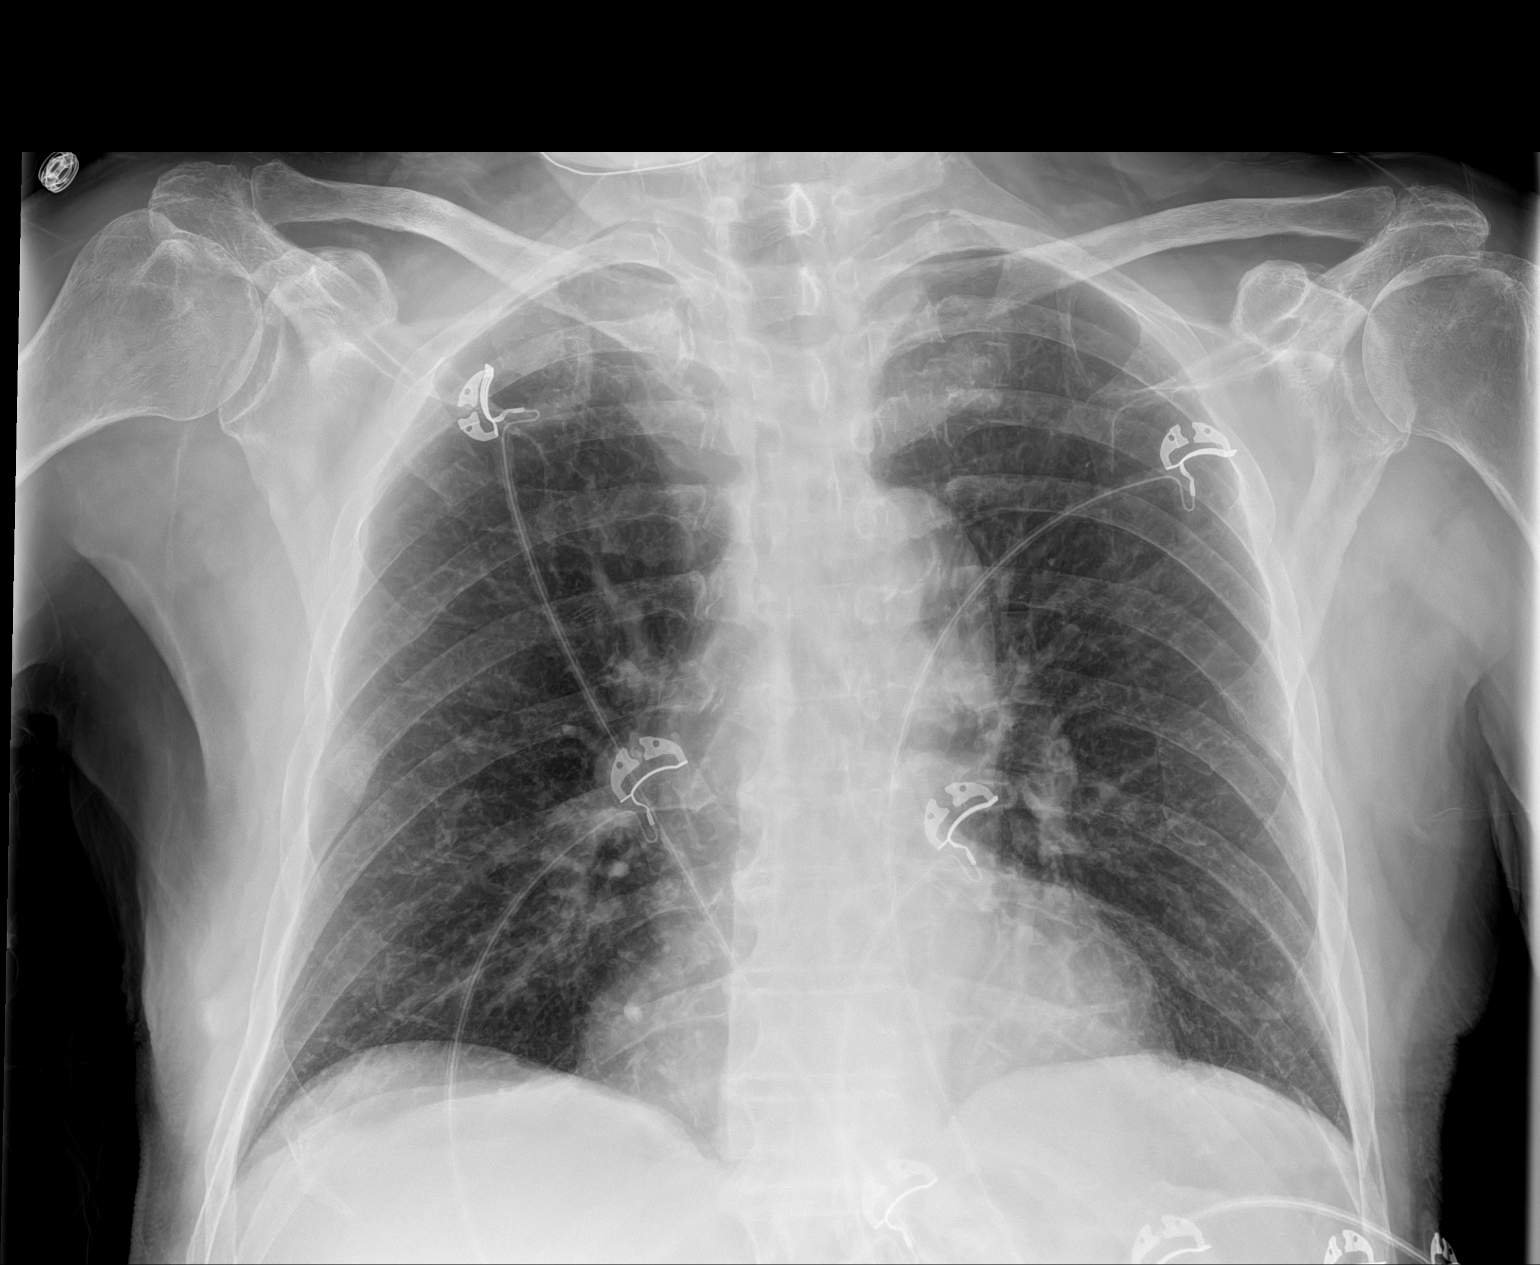

[1 of 1 positions shown; findings below may reference images not displayed]

FINDINGS: The heart size and mediastinal contours are within normal limits.
Aortic calcifications. Both lungs are clear. No pleural effusion or
pneumothorax. Healed right posterolateral fifth and sixth rib
fractures.
IMPRESSION: No active cardiopulmonary disease.

## 2022-01-01 IMAGING — CT CT HEAD W/O CM
3 series · 16 of 47 positions shown, 19 images · non-contrast
Comparison: Head CT 10/04/2019.

CLINICAL DATA: 85-year-old male with history of dementia.
Generalized weakness.

EXAM:
CT HEAD WITHOUT CONTRAST
TECHNIQUE: Contiguous axial images were obtained from the base of the skull
through the vertex without intravenous contrast.

[Series 2: head w o · axial · 0.42mm/px · z∈[+17,+152]mm · 10 of 33 slices shown, 13 images]
[im 3/33  brain]
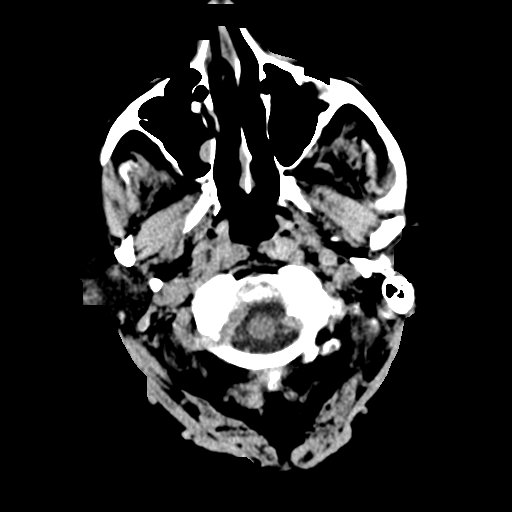
[im 3/33  bone]
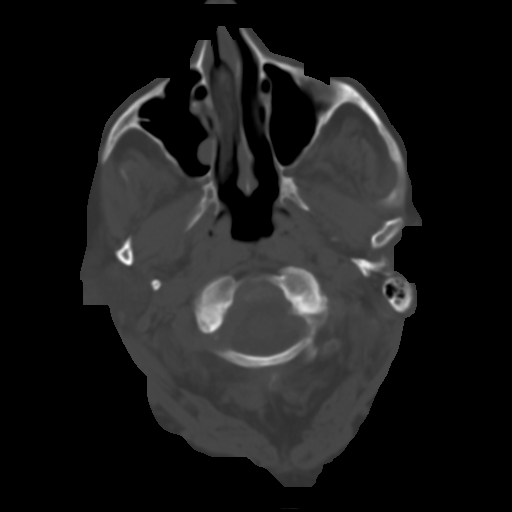
[im 6/33  brain]
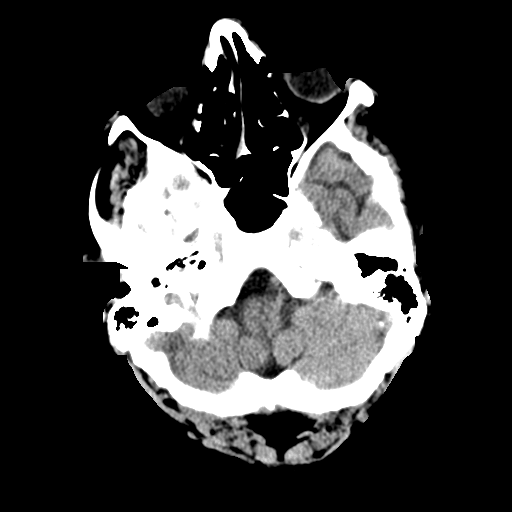
[im 9/33  brain]
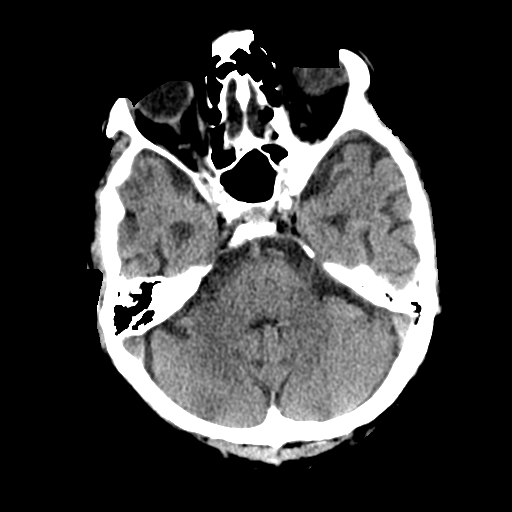
[im 12/33  brain]
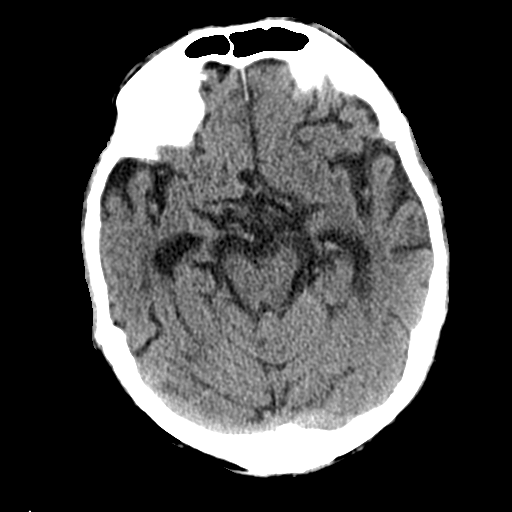
[im 15/33  brain]
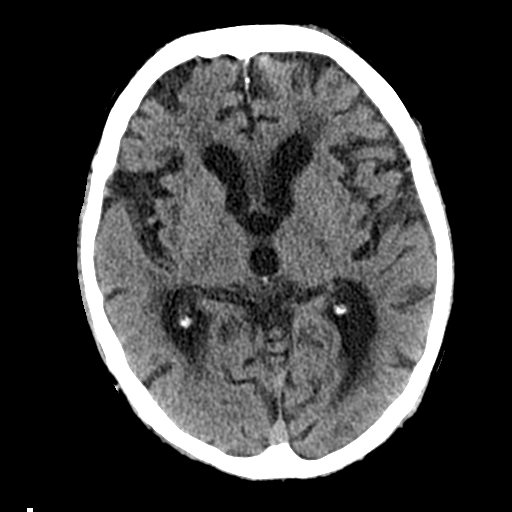
[im 15/33  bone]
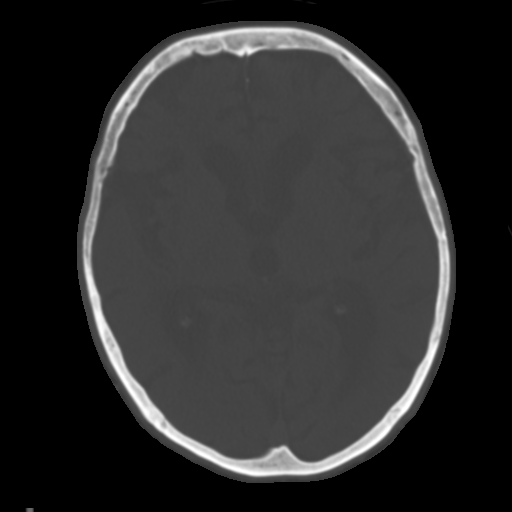
[im 18/33  brain]
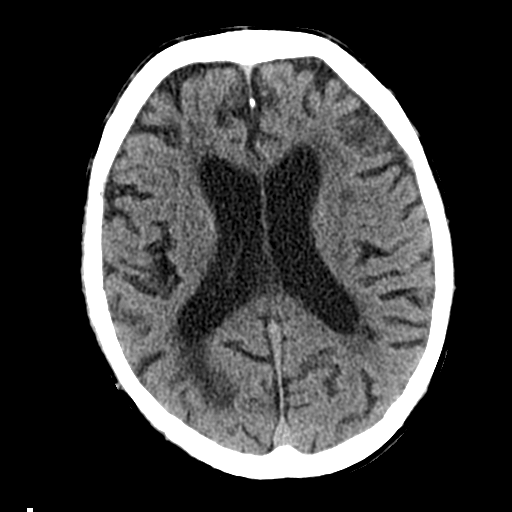
[im 21/33  brain]
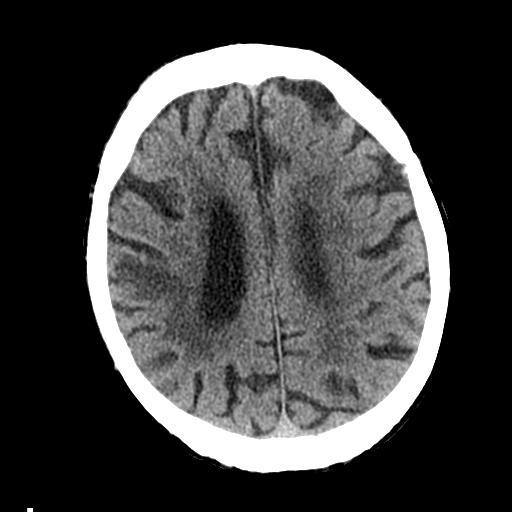
[im 25/33  brain]
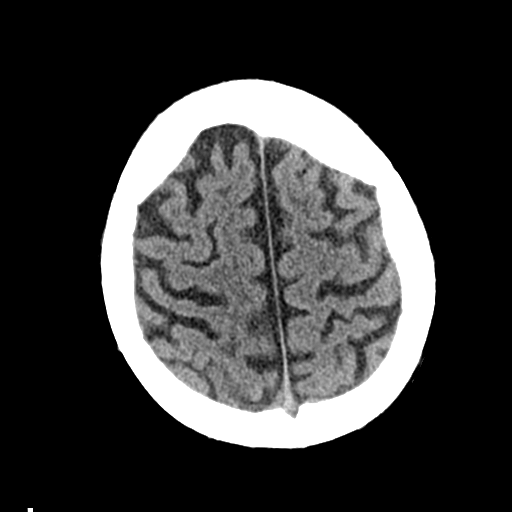
[im 27/33  brain]
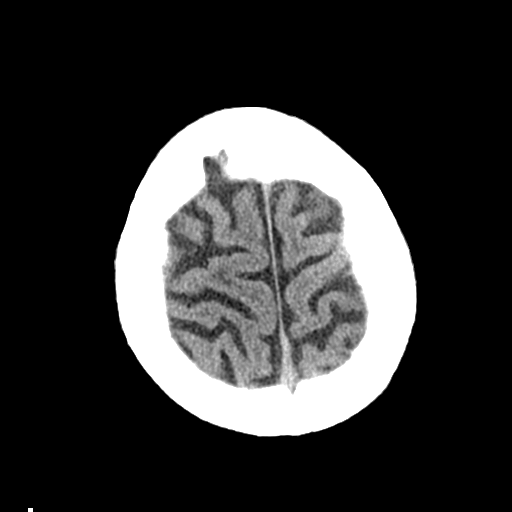
[im 27/33  bone]
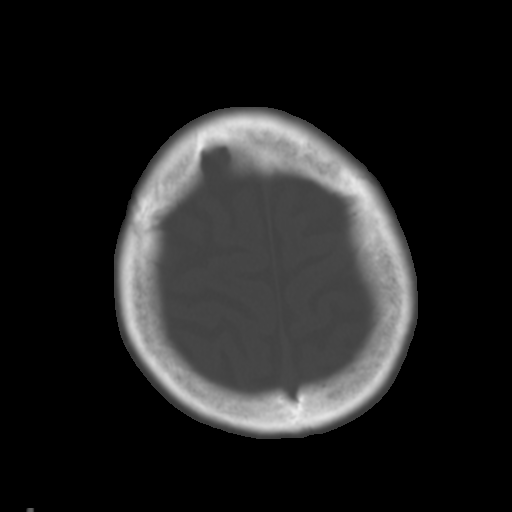
[im 30/33  brain]
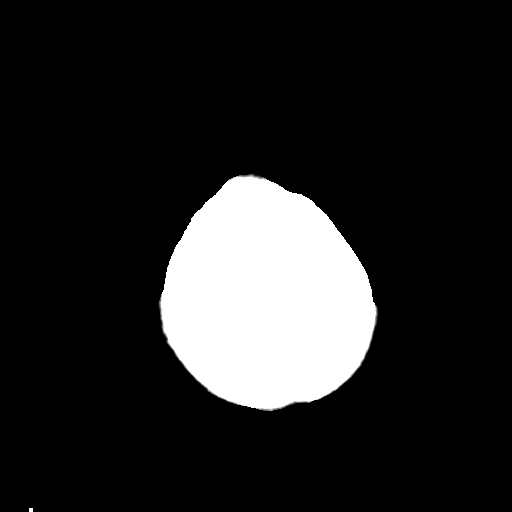

[Series 4: coronal soft · coronal · 0.40mm/px · 3 of 71 slices shown]
[im 24/71  brain]
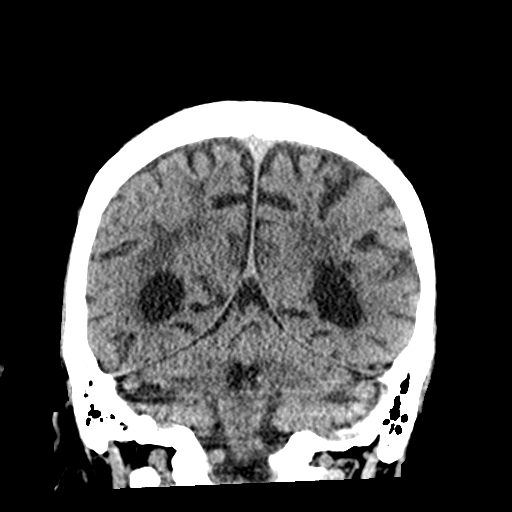
[im 32/71  brain]
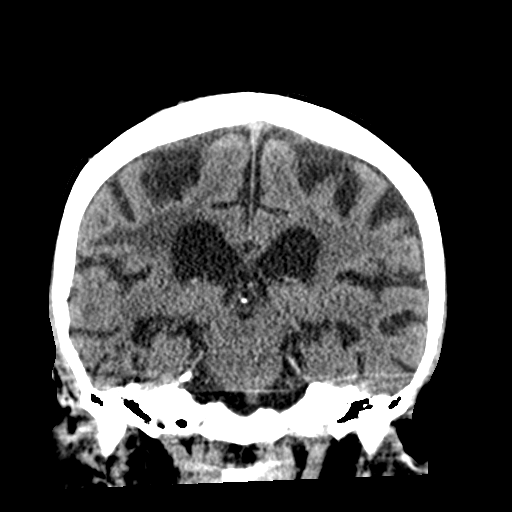
[im 39/71  brain]
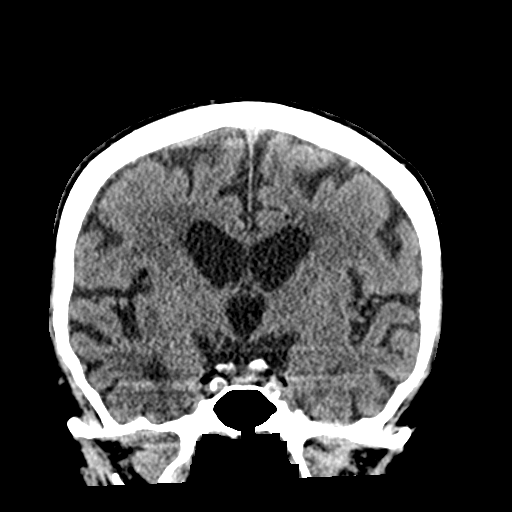

[Series 5: sagittal soft · sagittal · 0.35mm/px · 3 of 59 slices shown]
[im 20/59  brain]
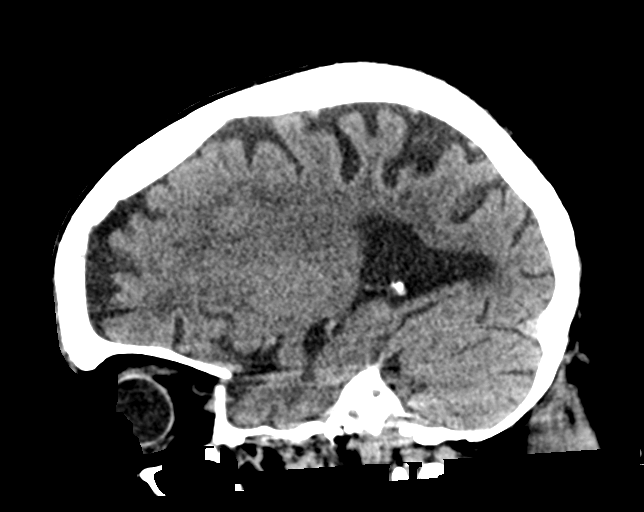
[im 30/59  brain]
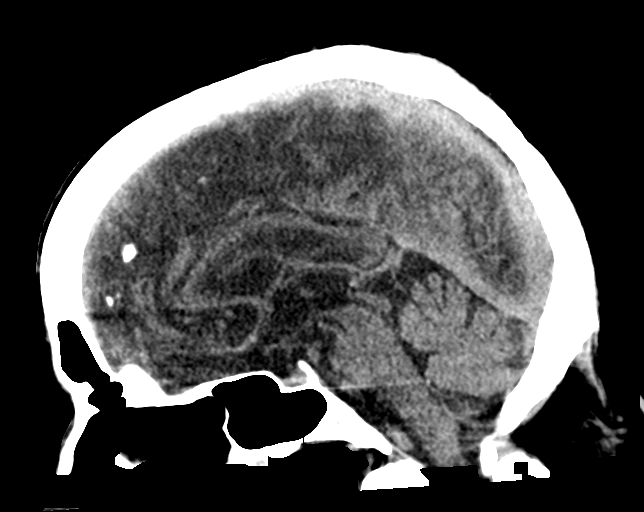
[im 39/59  brain]
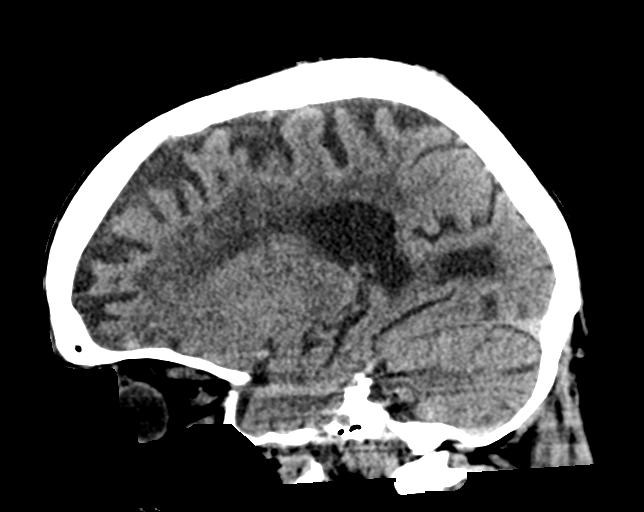

[16 of 47 positions shown; findings below may reference images not displayed]

FINDINGS: Brain: Severe cerebral and mild cerebellar atrophy. Patchy and
confluent areas of decreased attenuation are noted throughout the
deep and periventricular white matter of the cerebral hemispheres
bilaterally, compatible with chronic microvascular ischemic disease.
No evidence of acute infarction, hemorrhage, hydrocephalus,
extra-axial collection or mass lesion/mass effect.

Vascular: No hyperdense vessel or unexpected calcification.

Skull: Normal. Negative for fracture or focal lesion.

Sinuses/Orbits: No acute finding.

Other: None.
IMPRESSION: 1. No acute intracranial abnormalities.
2. Severe cerebral and mild cerebellar atrophy with severe chronic
microvascular ischemic changes in the cerebral white matter, similar
to the prior study, as above.
